# Patient Record
Sex: Male | Born: 1935 | Race: White | Hispanic: No | Marital: Married | Smoking: Former smoker
Health system: Southern US, Community
[De-identification: ages and names within clinical notes are randomized; demographics above are authoritative.]

## PROBLEM LIST (undated history)

## (undated) DIAGNOSIS — Z8673 Personal history of transient ischemic attack (TIA), and cerebral infarction without residual deficits: Secondary | ICD-10-CM

## (undated) DIAGNOSIS — I482 Chronic atrial fibrillation, unspecified: Secondary | ICD-10-CM

## (undated) DIAGNOSIS — I10 Essential (primary) hypertension: Secondary | ICD-10-CM

## (undated) DIAGNOSIS — H409 Unspecified glaucoma: Secondary | ICD-10-CM

## (undated) DIAGNOSIS — S2239XA Fracture of one rib, unspecified side, initial encounter for closed fracture: Secondary | ICD-10-CM

## (undated) HISTORY — PX: CATARACT EXTRACTION: SUR2

## (undated) HISTORY — DX: Personal history of transient ischemic attack (TIA), and cerebral infarction without residual deficits: Z86.73

## (undated) HISTORY — PX: OTHER SURGICAL HISTORY: SHX169

## (undated) HISTORY — DX: Essential (primary) hypertension: I10

## (undated) HISTORY — DX: Chronic atrial fibrillation, unspecified: I48.20

---

## 2006-09-08 ENCOUNTER — Ambulatory Visit (HOSPITAL_COMMUNITY): Admission: RE | Admit: 2006-09-08 | Discharge: 2006-09-08 | Payer: Self-pay | Admitting: Family Medicine

## 2006-12-02 ENCOUNTER — Ambulatory Visit (HOSPITAL_COMMUNITY): Admission: RE | Admit: 2006-12-02 | Discharge: 2006-12-02 | Payer: Self-pay | Admitting: General Surgery

## 2012-08-20 ENCOUNTER — Encounter: Payer: Self-pay | Admitting: Internal Medicine

## 2012-09-08 ENCOUNTER — Encounter: Payer: Self-pay | Admitting: Internal Medicine

## 2012-09-08 ENCOUNTER — Ambulatory Visit (INDEPENDENT_AMBULATORY_CARE_PROVIDER_SITE_OTHER): Payer: Medicare Other | Admitting: Internal Medicine

## 2012-09-08 VITALS — BP 130/70 | HR 60 | Ht 70.0 in | Wt 168.0 lb

## 2012-09-08 DIAGNOSIS — I10 Essential (primary) hypertension: Secondary | ICD-10-CM | POA: Insufficient documentation

## 2012-09-08 DIAGNOSIS — I4891 Unspecified atrial fibrillation: Secondary | ICD-10-CM

## 2012-09-08 NOTE — Progress Notes (Signed)
OFFICE NOTE  Chief Complaint:  Six-month followup  Primary Care Physician: No primary provider on file.  HPI:  Zachary Arellano is a 79 year old gentleman with history of chronic atrial fibrillation and hypertension which has been well controlled. Recently, he was put on Xarelto which he seems to be tolerating very well. I had also decreased his aspirin to 81 mg daily. With regards to blood pressure control, he is on several medications. Blood pressure has remained well controlled; it is 134/60 today. He has managed to lose a couple of pounds and remains active and denies any chest pain, worsening shortness of breath, palpitations, presyncope or syncopal symptoms.  PMHx:  Past Medical History  Diagnosis Date  . Chronic atrial fibrillation     2D echo  09/23/2006 LA and right atrium severely dilated, moderate pulmonary hypertension  . Hypertension     History reviewed. No pertinent past surgical history.  FAMHx:  Family History  Problem Relation Age of Onset  . Cancer Mother   . Heart disease Father   . Emphysema Father   . Heart disease Brother     SOCHx:   reports that he has quit smoking. He does not have any smokeless tobacco history on file. He reports that  drinks alcohol. He reports that he does not use illicit drugs.  ALLERGIES:  No Known Allergies  ROS: A comprehensive review of systems was negative except for: Constitutional: positive for small amount of weight loss Ears, nose, mouth, throat, and face: positive for nasal congestion  HOME MEDS: Current Outpatient Prescriptions  Medication Sig Dispense Refill  . aspirin EC 81 MG tablet Take 81 mg by mouth daily.      Marland Kitchen atenolol (TENORMIN) 100 MG tablet Take 100 mg by mouth daily.      . cloNIDine (CATAPRES) 0.2 MG tablet Take 0.2 mg by mouth 2 (two) times daily.      Marland Kitchen diltiazem (CARDIZEM CD) 240 MG 24 hr capsule Take 240 mg by mouth daily.      . dorzolamide-timolol (COSOPT) 22.3-6.8 MG/ML ophthalmic solution  Place 1 drop into both eyes 2 (two) times daily.      Marland Kitchen lisinopril-hydrochlorothiazide (PRINZIDE,ZESTORETIC) 20-25 MG per tablet Take 1 tablet by mouth daily.      . rivaroxaban (XARELTO) 10 MG TABS tablet Take by mouth daily. Takes 20 mg daily      . timolol (BETIMOL) 0.5 % ophthalmic solution Place 1 drop into both eyes 2 (two) times daily.       No current facility-administered medications for this visit.    LABS/IMAGING: No results found for this or any previous visit (from the past 48 hour(s)). No results found.  VITALS: BP 130/70  Pulse 60  Ht 5\' 10"  (1.778 m)  Wt 168 lb (76.204 kg)  BMI 24.11 kg/m2  EXAM: General appearance: alert and no distress Neck: no adenopathy, no carotid bruit, no JVD, supple, symmetrical, trachea midline and thyroid not enlarged, symmetric, no tenderness/mass/nodules Lungs: clear to auscultation bilaterally Heart: irregularly irregular rhythm Abdomen: soft, non-tender; bowel sounds normal; no masses,  no organomegaly Extremities: extremities normal, atraumatic, no cyanosis or edema Pulses: 2+ and symmetric Skin: Skin color, texture, turgor normal. No rashes or lesions Neurologic: Grossly normal  EKG: Atrial fibrillation at 60, nonspecific T wave changes  ASSESSMENT: 1. Atrial fibrillation with controlled ventricular response on Xarelto 2. Hypertension-well controlled  PLAN: 1.   [name] continues to do well with chronic atrial fibrillation. He is maintaining her relative and is  not having any issues with adverse bleeding. We gave him samples of the relative today. He is heart rate is well controlled. His blood pressure is also well controlled and he continues to be asymptomatic without any shortness of breath or chest pain despite activity. We will continue with his current medication regimen and plan to see him back in 6 months to a year.  Chrystie Nose, MD, [facility] Attending Cardiologist [facility]  HILTY,Kenneth C 09/08/2012, 9:43 AM

## 2012-09-08 NOTE — Patient Instructions (Signed)
Your physician recommends that you schedule a follow-up appointment in: 6 months  

## 2012-10-06 ENCOUNTER — Other Ambulatory Visit: Payer: Self-pay | Admitting: Internal Medicine

## 2012-10-06 NOTE — Telephone Encounter (Signed)
Rx was sent to pharmacy electronically. 

## 2012-11-19 ENCOUNTER — Other Ambulatory Visit: Payer: Self-pay | Admitting: Internal Medicine

## 2012-11-19 NOTE — Telephone Encounter (Signed)
Rx was sent to pharmacy electronically. 

## 2012-12-15 ENCOUNTER — Other Ambulatory Visit: Payer: Self-pay | Admitting: Internal Medicine

## 2012-12-15 NOTE — Telephone Encounter (Signed)
Rx was sent to pharmacy electronically. 

## 2012-12-23 ENCOUNTER — Other Ambulatory Visit: Payer: Self-pay | Admitting: Internal Medicine

## 2012-12-23 NOTE — Telephone Encounter (Signed)
Rx was sent to pharmacy electronically. 

## 2013-03-02 ENCOUNTER — Encounter: Payer: Self-pay | Admitting: Internal Medicine

## 2013-03-02 ENCOUNTER — Ambulatory Visit (INDEPENDENT_AMBULATORY_CARE_PROVIDER_SITE_OTHER): Payer: Medicare Other | Admitting: Internal Medicine

## 2013-03-02 VITALS — BP 122/80 | HR 65 | Ht 70.0 in | Wt 172.2 lb

## 2013-03-02 DIAGNOSIS — I4891 Unspecified atrial fibrillation: Secondary | ICD-10-CM

## 2013-03-02 DIAGNOSIS — I10 Essential (primary) hypertension: Secondary | ICD-10-CM

## 2013-03-02 NOTE — Patient Instructions (Signed)
Your physician wants you to follow-up in:  6 months. You will receive a reminder letter in the mail two months in advance. If you don't receive a letter, please call our office to schedule the follow-up appointment.   

## 2013-03-02 NOTE — Progress Notes (Signed)
OFFICE NOTE  Chief Complaint:  Routine follow-up  Primary Care Physician: Kirk Ruths, MD  HPI:  Zachary Arellano  is a 79 year old gentleman with history of chronic atrial fibrillation and hypertension which has been well controlled. Recently, he was put on Xarelto which he seems to be tolerating very well. I had also decreased his aspirin to 81 mg daily. With regards to blood pressure control, he is on several medications. Blood pressure has remained well controlled. He has managed to lose a couple of pounds and remains active and denies any chest pain, worsening shortness of breath, palpitations, presyncope or syncopal symptoms.   PMHx:  Past Medical History  Diagnosis Date  . Chronic atrial fibrillation     2D echo  09/23/2006 LA and right atrium severely dilated, moderate pulmonary hypertension  . Hypertension     History reviewed. No pertinent past surgical history.  FAMHx:  Family History  Problem Relation Age of Onset  . Cancer Mother   . Heart disease Father   . Emphysema Father   . Heart disease Brother     SOCHx:   reports that he has quit smoking. He does not have any smokeless tobacco history on file. He reports that he drinks alcohol. He reports that he does not use illicit drugs.  ALLERGIES:  No Known Allergies  ROS: A comprehensive review of systems was negative.  HOME MEDS: Current Outpatient Prescriptions  Medication Sig Dispense Refill  . aspirin EC 81 MG tablet Take 81 mg by mouth daily.      Marland Kitchen atenolol (TENORMIN) 100 MG tablet Take 100 mg by mouth daily.      . cloNIDine (CATAPRES) 0.2 MG tablet Take 1 tablet (0.2 mg total) by mouth 2 (two) times daily.  60 tablet  5  . diltiazem (CARDIZEM CD) 240 MG 24 hr capsule TAKE ONE CAPSULE BY MOUTH DAILY  30 capsule  8  . dorzolamide-timolol (COSOPT) 22.3-6.8 MG/ML ophthalmic solution Place 1 drop into both eyes 2 (two) times daily.      Marland Kitchen lisinopril-hydrochlorothiazide (PRINZIDE,ZESTORETIC) 20-25  MG per tablet TAKE ONE (1) TABLET EACH DAY  90 tablet  1  . rivaroxaban (XARELTO) 10 MG TABS tablet Take by mouth daily. Takes 20 mg daily       No current facility-administered medications for this visit.    LABS/IMAGING: No results found for this or any previous visit (from the past 48 hour(s)). No results found.  VITALS: BP 122/80  Pulse 65  Ht 5\' 10"  (1.778 m)  Wt 172 lb 3.2 oz (78.109 kg)  BMI 24.71 kg/m2  EXAM: General appearance: alert and no distress Neck: no adenopathy, no carotid bruit, no JVD, supple, symmetrical, trachea midline and thyroid not enlarged, symmetric, no tenderness/mass/nodules Lungs: clear to auscultation bilaterally Heart: irregularly irregular, S1, S2 normal, no murmur, click, rub or gallop Abdomen: soft, non-tender; bowel sounds normal; no masses,  no organomegaly Extremities: extremities normal, atraumatic, no cyanosis or edema Pulses: 2+ and symmetric Skin: Skin color, texture, turgor normal. No rashes or lesions Neurologic: Grossly normal psych: Pleasant, normal mood  EKG: Atrial fibrillation at 65  ASSESSMENT: 1. Chronic atrial fibrillation - on Xarelto without bleeding 2. Hypertension-well controlled  PLAN: 1.   Mr. Zachary Arellano is doing well on Xarelto for atrial fibrillation. He also takes low-dose aspirin. He previously been on full dose aspirin and I decreased that and reminded him that he does not necessarily need to take aspirin in addition to his Xarelto, however he  wishes to continue it. His blood pressure has been very well controlled on his current medicines and I would not recommend changes there. He recently had a repeat lipid profile which shows excellent control and it is not recommended that he is on cholesterol medications at this time. Plan to followup with me again in 6 months or sooner as necessary.  Chrystie Nose, MD, [facility] Attending Cardiologist [facility]  Jeidi Gilles C 03/02/2013, 4:48 PM

## 2013-03-03 ENCOUNTER — Encounter: Payer: Self-pay | Admitting: Internal Medicine

## 2013-04-12 ENCOUNTER — Other Ambulatory Visit: Payer: Self-pay | Admitting: Internal Medicine

## 2013-04-12 NOTE — Telephone Encounter (Signed)
Rx was sent to pharmacy electronically. 

## 2013-06-09 ENCOUNTER — Other Ambulatory Visit: Payer: Self-pay | Admitting: Internal Medicine

## 2013-06-09 MED ORDER — CLONIDINE HCL 0.2 MG PO TABS: ORAL_TABLET | ORAL | Status: DC

## 2013-06-09 NOTE — Addendum Note (Signed)
Addended by: Diana Eves on: 06/09/2013 11:42 AM   Modules accepted: Orders

## 2013-06-09 NOTE — Telephone Encounter (Signed)
Rx was sent to pharmacy electronically. 

## 2013-07-03 ENCOUNTER — Other Ambulatory Visit: Payer: Self-pay | Admitting: Internal Medicine

## 2013-07-03 NOTE — Telephone Encounter (Signed)
Rx was sent to pharmacy electronically. 

## 2013-09-09 ENCOUNTER — Other Ambulatory Visit: Payer: Self-pay | Admitting: Internal Medicine

## 2013-09-09 NOTE — Telephone Encounter (Signed)
Rx was sent to pharmacy electronically. Last OV 02/2013 (recall for 6 month visit - due 08/2013)

## 2013-09-25 ENCOUNTER — Other Ambulatory Visit: Payer: Self-pay | Admitting: Internal Medicine

## 2013-10-14 ENCOUNTER — Encounter: Payer: Self-pay | Admitting: Internal Medicine

## 2013-10-14 ENCOUNTER — Ambulatory Visit (INDEPENDENT_AMBULATORY_CARE_PROVIDER_SITE_OTHER): Payer: Medicare Other | Admitting: Internal Medicine

## 2013-10-14 VITALS — BP 152/84 | HR 59 | Ht 70.0 in | Wt 169.0 lb

## 2013-10-14 DIAGNOSIS — I482 Chronic atrial fibrillation, unspecified: Secondary | ICD-10-CM

## 2013-10-14 DIAGNOSIS — Z7901 Long term (current) use of anticoagulants: Secondary | ICD-10-CM

## 2013-10-14 DIAGNOSIS — I4891 Unspecified atrial fibrillation: Secondary | ICD-10-CM

## 2013-10-14 DIAGNOSIS — I10 Essential (primary) hypertension: Secondary | ICD-10-CM

## 2013-10-14 MED ORDER — RIVAROXABAN 20 MG PO TABS: ORAL_TABLET | ORAL | Status: DC

## 2013-10-14 NOTE — Progress Notes (Signed)
OFFICE NOTE  Chief Complaint:  Routine follow-up  Primary Care Physician: Leonides Grills, MD  HPI:  Zachary Arellano  is a 80 year old gentleman with history of chronic atrial fibrillation and hypertension which has been well controlled. Recently, he was put on Xarelto which he seems to be tolerating very well. I had also decreased his aspirin to 81 mg daily. With regards to blood pressure control, he is on several medications. Blood pressure has remained well controlled. He has managed to lose a couple of pounds and remains active and denies any chest pain, worsening shortness of breath, palpitations, presyncope or syncopal symptoms.   Mr. Tousley returns today for followup. He denies any new complaints. He has had no bleeding issues on Xarelto.  PMHx:  Past Medical History  Diagnosis Date  . Chronic atrial fibrillation     2D echo  09/23/2006 LA and right atrium severely dilated, moderate pulmonary hypertension  . Hypertension     History reviewed. No pertinent past surgical history.  FAMHx:  Family History  Problem Relation Age of Onset  . Cancer Mother   . Heart disease Father   . Emphysema Father   . Heart disease Brother     SOCHx:   reports that he has quit smoking. He does not have any smokeless tobacco history on file. He reports that he drinks alcohol. He reports that he does not use illicit drugs.  ALLERGIES:  No Known Allergies  ROS: A comprehensive review of systems was negative.  HOME MEDS: Current Outpatient Prescriptions  Medication Sig Dispense Refill  . aspirin EC 81 MG tablet Take 81 mg by mouth daily.      Marland Kitchen atenolol (TENORMIN) 100 MG tablet TAKE ONE (1) TABLET EACH DAY  90 tablet  2  . cloNIDine (CATAPRES) 0.2 MG tablet TAKE ONE TABLET TWICE DAILY  60 tablet  5  . diltiazem (DILT-XR) 240 MG 24 hr capsule Take 1 capsule (240 mg total) by mouth daily. <please make appointment for future refills>  30 capsule  1  . dorzolamide-timolol (COSOPT)  22.3-6.8 MG/ML ophthalmic solution Place 1 drop into both eyes 2 (two) times daily.      Marland Kitchen latanoprost (XALATAN) 0.005 % ophthalmic solution Place 1 drop into both eyes at bedtime.      Marland Kitchen lisinopril-hydrochlorothiazide (PRINZIDE,ZESTORETIC) 20-25 MG per tablet TAKE ONE (1) TABLET EACH DAY  90 tablet  3  . rivaroxaban (XARELTO) 20 MG TABS tablet TAKE ONE (1) TABLET EACH DAY  30 tablet  0  . [DISCONTINUED] diltiazem (CARDIZEM CD) 240 MG 24 hr capsule TAKE ONE CAPSULE BY MOUTH DAILY  30 capsule  8   No current facility-administered medications for this visit.    LABS/IMAGING: No results found for this or any previous visit (from the past 48 hour(s)). No results found.  VITALS: BP 152/84  Pulse 59  Ht 5\' 10"  (1.778 m)  Wt 169 lb (76.658 kg)  BMI 24.25 kg/m2  EXAM: General appearance: alert and no distress Neck: no adenopathy, no carotid bruit, no JVD, supple, symmetrical, trachea midline and thyroid not enlarged, symmetric, no tenderness/mass/nodules Lungs: clear to auscultation bilaterally Heart: irregularly irregular, S1, S2 normal, no murmur, click, rub or gallop Abdomen: soft, non-tender; bowel sounds normal; no masses,  no organomegaly Extremities: extremities normal, atraumatic, no cyanosis or edema Pulses: 2+ and symmetric Skin: Skin color, texture, turgor normal. No rashes or lesions Neurologic: Grossly normal psych: Pleasant, normal mood  EKG: Atrial fibrillation at 59  ASSESSMENT: 1.  Chronic atrial fibrillation - on Xarelto without bleeding 2. Hypertension-well controlled  PLAN: 1.   Mr. Mcquillen is doing well on Xarelto for atrial fibrillation. Blood pressure has also been well controlled. There no bleeding issues. Overall he feels well. He occasionally has some positional dizziness but this is not new for him. I recommend continuing his current medications we'll see him back in 6 months.  Pixie Casino, MD, [facility] Attending Cardiologist [facility]  Jabreel Chimento C 10/14/2013, 10:54 AM

## 2013-10-14 NOTE — Patient Instructions (Signed)
Your physician wants you to follow-up in:  6 months. You will receive a reminder letter in the mail two months in advance. If you don't receive a letter, please call our office to schedule the follow-up appointment.   

## 2013-11-05 ENCOUNTER — Other Ambulatory Visit: Payer: Self-pay | Admitting: Internal Medicine

## 2013-11-05 NOTE — Telephone Encounter (Signed)
Rx refill sent to patient pharmacy   

## 2013-12-06 ENCOUNTER — Other Ambulatory Visit: Payer: Self-pay | Admitting: Internal Medicine

## 2013-12-06 NOTE — Telephone Encounter (Signed)
Rx refill sent to patient pharmacy   

## 2014-01-06 ENCOUNTER — Telehealth: Payer: Self-pay | Admitting: Internal Medicine

## 2014-01-06 NOTE — Telephone Encounter (Signed)
Spoke with pt, aware samples placed at the front desk for pick up 

## 2014-01-06 NOTE — Telephone Encounter (Signed)
Pt need some samples of Xarelto 20 ng please.

## 2014-03-16 ENCOUNTER — Other Ambulatory Visit: Payer: Self-pay | Admitting: Internal Medicine

## 2014-03-16 NOTE — Telephone Encounter (Signed)
Rx has been sent to the pharmacy electronically. ° °

## 2014-03-22 ENCOUNTER — Encounter: Payer: Self-pay | Admitting: Internal Medicine

## 2014-03-22 ENCOUNTER — Ambulatory Visit (INDEPENDENT_AMBULATORY_CARE_PROVIDER_SITE_OTHER): Payer: Medicare Other | Admitting: Internal Medicine

## 2014-03-22 VITALS — BP 134/72 | HR 61 | Ht 70.0 in | Wt 167.2 lb

## 2014-03-22 DIAGNOSIS — I482 Chronic atrial fibrillation, unspecified: Secondary | ICD-10-CM

## 2014-03-22 DIAGNOSIS — Z7901 Long term (current) use of anticoagulants: Secondary | ICD-10-CM

## 2014-03-22 DIAGNOSIS — I10 Essential (primary) hypertension: Secondary | ICD-10-CM

## 2014-03-22 MED ORDER — RIVAROXABAN 20 MG PO TABS: ORAL_TABLET | ORAL | Status: DC

## 2014-03-22 NOTE — Patient Instructions (Signed)
Your physician wants you to follow-up in: 6 months with Dr. Hilty. You will receive a reminder letter in the mail two months in advance. If you don't receive a letter, please call our office to schedule the follow-up appointment.    

## 2014-03-22 NOTE — Progress Notes (Signed)
OFFICE NOTE  Chief Complaint:  Routine follow-up  Primary Care Physician: Purvis Kilts, MD  HPI:  Zachary Arellano  is a 80 year old gentleman with history of chronic atrial fibrillation and hypertension which has been well controlled. Recently, he was put on Xarelto which he seems to be tolerating very well. I had also decreased his aspirin to 81 mg daily. With regards to blood pressure control, he is on several medications. Blood pressure has remained well controlled. He has managed to lose a couple of pounds and remains active and denies any chest pain, worsening shortness of breath, palpitations, presyncope or syncopal symptoms.   Rayvion is doing quite well. He continues to be active. He replaced golf just a few days ago without any problems. Denies any chest pain or shortness of breath. He's had no bleeding issues on Zarrella 2 although was concerned because his cousin had a significant intra-abdominal hemorrhage on result oh requiring transfusion. He's done well and we should continue this medication. He remains in A. fib and is permanently which puts him at higher risk of stroke. Blood pressure is well controlled.  PMHx:  Past Medical History  Diagnosis Date  . Chronic atrial fibrillation     2D echo  09/23/2006 LA and right atrium severely dilated, moderate pulmonary hypertension  . Hypertension     History reviewed. No pertinent past surgical history.  FAMHx:  Family History  Problem Relation Age of Onset  . Cancer Mother   . Heart disease Father   . Emphysema Father   . Heart disease Brother     SOCHx:   reports that he has quit smoking. He does not have any smokeless tobacco history on file. He reports that he drinks alcohol. He reports that he does not use illicit drugs.  ALLERGIES:  No Known Allergies  ROS: A comprehensive review of systems was negative.  HOME MEDS: Current Outpatient Prescriptions  Medication Sig Dispense Refill  . aspirin EC 81 MG  tablet Take 81 mg by mouth daily.    Marland Kitchen atenolol (TENORMIN) 100 MG tablet TAKE ONE (1) TABLET EACH DAY 90 tablet 1  . cloNIDine (CATAPRES) 0.2 MG tablet TAKE ONE TABLET TWICE DAILY 60 tablet 5  . DILT-XR 240 MG 24 hr capsule TAKE ONE CAPSULE BY MOUTH DAILY 30 capsule 5  . dorzolamide-timolol (COSOPT) 22.3-6.8 MG/ML ophthalmic solution Place 1 drop into both eyes 2 (two) times daily.    Marland Kitchen latanoprost (XALATAN) 0.005 % ophthalmic solution Place 1 drop into both eyes at bedtime.    Marland Kitchen lisinopril-hydrochlorothiazide (PRINZIDE,ZESTORETIC) 20-25 MG per tablet TAKE ONE (1) TABLET EACH DAY 90 tablet 3  . rivaroxaban (XARELTO) 20 MG TABS tablet TAKE ONE (1) TABLET EACH DAY 25 tablet 0  . [DISCONTINUED] diltiazem (CARDIZEM CD) 240 MG 24 hr capsule TAKE ONE CAPSULE BY MOUTH DAILY 30 capsule 8   No current facility-administered medications for this visit.    LABS/IMAGING: No results found for this or any previous visit (from the past 48 hour(s)). No results found.  VITALS: BP 134/72 mmHg  Pulse 61  Ht 5\' 10"  (1.778 m)  Wt 167 lb 3.2 oz (75.841 kg)  BMI 23.99 kg/m2  EXAM: General appearance: alert and no distress Neck: no adenopathy, no carotid bruit, no JVD, supple, symmetrical, trachea midline and thyroid not enlarged, symmetric, no tenderness/mass/nodules Lungs: clear to auscultation bilaterally Heart: irregularly irregular, S1, S2 normal, no murmur, click, rub or gallop Abdomen: soft, non-tender; bowel sounds normal; no masses,  no organomegaly Extremities: extremities normal, atraumatic, no cyanosis or edema Pulses: 2+ and symmetric Skin: Skin color, texture, turgor normal. No rashes or lesions Neurologic: Grossly normal psych: Pleasant, normal mood  EKG: Atrial fibrillation at 61  ASSESSMENT: 1. Chronic atrial fibrillation - on Xarelto without bleeding 2. Hypertension-well controlled  PLAN: 1.   Mr. Helbing is doing well on Xarelto for atrial fibrillation. Blood pressure has also  been well controlled. There no bleeding issues. Overall he feels well. He occasionally has some positional dizziness but this is not new for him. I recommend continuing his current medications we'll see him back in 6 months.  Pixie Casino, MD, [facility] Attending Cardiologist [facility]  Alesha Jaffee C 03/22/2014, 6:01 PM

## 2014-04-30 ENCOUNTER — Emergency Department (HOSPITAL_COMMUNITY)
Admission: EM | Admit: 2014-04-30 | Discharge: 2014-04-30 | Disposition: A | Discharge status: Home / Self Care | Payer: Medicare Other | Source: Home / Self Care | Attending: Emergency Medicine | Admitting: Emergency Medicine

## 2014-04-30 ENCOUNTER — Telehealth: Payer: Self-pay | Admitting: Physician Assistant

## 2014-04-30 ENCOUNTER — Encounter (HOSPITAL_COMMUNITY): Payer: Self-pay | Admitting: *Deleted

## 2014-04-30 ENCOUNTER — Emergency Department (HOSPITAL_COMMUNITY)
Admission: EM | Admit: 2014-04-30 | Discharge: 2014-04-30 | Disposition: A | Discharge status: Home / Self Care | Payer: Medicare Other | Attending: Emergency Medicine | Admitting: Emergency Medicine

## 2014-04-30 ENCOUNTER — Encounter (HOSPITAL_COMMUNITY): Payer: Self-pay | Admitting: Emergency Medicine

## 2014-04-30 DIAGNOSIS — L7621 Postprocedural hemorrhage and hematoma of skin and subcutaneous tissue following a dermatologic procedure: Secondary | ICD-10-CM | POA: Insufficient documentation

## 2014-04-30 DIAGNOSIS — L7622 Postprocedural hemorrhage and hematoma of skin and subcutaneous tissue following other procedure: Secondary | ICD-10-CM

## 2014-04-30 DIAGNOSIS — T148XXA Other injury of unspecified body region, initial encounter: Secondary | ICD-10-CM

## 2014-04-30 DIAGNOSIS — Z87891 Personal history of nicotine dependence: Secondary | ICD-10-CM

## 2014-04-30 DIAGNOSIS — Z7982 Long term (current) use of aspirin: Secondary | ICD-10-CM

## 2014-04-30 DIAGNOSIS — I10 Essential (primary) hypertension: Secondary | ICD-10-CM | POA: Insufficient documentation

## 2014-04-30 DIAGNOSIS — Z79899 Other long term (current) drug therapy: Secondary | ICD-10-CM

## 2014-04-30 DIAGNOSIS — Z7901 Long term (current) use of anticoagulants: Secondary | ICD-10-CM | POA: Diagnosis not present

## 2014-04-30 DIAGNOSIS — D689 Coagulation defect, unspecified: Secondary | ICD-10-CM | POA: Diagnosis present

## 2014-04-30 LAB — I-STAT CHEM 8, ED
BUN: 20 mg/dL (ref 6–23)
CALCIUM ION: 1.16 mmol/L (ref 1.13–1.30)
CHLORIDE: 93 meq/L — AB (ref 96–112)
CREATININE: 0.8 mg/dL (ref 0.50–1.35)
Glucose, Bld: 145 mg/dL — ABNORMAL HIGH (ref 70–99)
HEMATOCRIT: 45 % (ref 39.0–52.0)
Hemoglobin: 15.3 g/dL (ref 13.0–17.0)
POTASSIUM: 4.1 mmol/L (ref 3.5–5.1)
Sodium: 131 mmol/L — ABNORMAL LOW (ref 135–145)
TCO2: 23 mmol/L (ref 0–100)

## 2014-04-30 LAB — CBC WITH DIFFERENTIAL/PLATELET
BASOS PCT: 0 % (ref 0–1)
Basophils Absolute: 0 10*3/uL (ref 0.0–0.1)
EOS ABS: 0.2 10*3/uL (ref 0.0–0.7)
Eosinophils Relative: 2 % (ref 0–5)
HCT: 37.6 % — ABNORMAL LOW (ref 39.0–52.0)
HEMOGLOBIN: 13.1 g/dL (ref 13.0–17.0)
LYMPHS ABS: 1.3 10*3/uL (ref 0.7–4.0)
Lymphocytes Relative: 14 % (ref 12–46)
MCH: 33.7 pg (ref 26.0–34.0)
MCHC: 34.8 g/dL (ref 30.0–36.0)
MCV: 96.7 fL (ref 78.0–100.0)
MONOS PCT: 13 % — AB (ref 3–12)
Monocytes Absolute: 1.2 10*3/uL — ABNORMAL HIGH (ref 0.1–1.0)
Neutro Abs: 6.5 10*3/uL (ref 1.7–7.7)
Neutrophils Relative %: 71 % (ref 43–77)
Platelets: 213 10*3/uL (ref 150–400)
RBC: 3.89 MIL/uL — AB (ref 4.22–5.81)
RDW: 13.4 % (ref 11.5–15.5)
WBC: 9.2 10*3/uL (ref 4.0–10.5)

## 2014-04-30 LAB — PROTIME-INR
INR: 1.69 — ABNORMAL HIGH (ref 0.00–1.49)
Prothrombin Time: 20 seconds — ABNORMAL HIGH (ref 11.6–15.2)

## 2014-04-30 MED ORDER — SILVER NITRATE-POT NITRATE 75-25 % EX MISC
1.0000 "application " | Freq: Once | CUTANEOUS | Status: AC
  Administered 2014-04-30: 1 via TOPICAL

## 2014-04-30 MED ORDER — SILVER NITRATE-POT NITRATE 75-25 % EX MISC
CUTANEOUS | Status: AC
  Administered 2014-04-30: 1 via TOPICAL
  Filled 2014-04-30: qty 1

## 2014-04-30 MED ORDER — LIDOCAINE-EPINEPHRINE (PF) 1 %-1:200000 IJ SOLN
INTRAMUSCULAR | Status: AC
  Filled 2014-04-30: qty 10

## 2014-04-30 MED ORDER — "THROMBI-PAD 3""X3"" EX PADS"
MEDICATED_PAD | CUTANEOUS | Status: AC
  Administered 2014-04-30: 06:00:00
  Filled 2014-04-30: qty 1

## 2014-04-30 NOTE — ED Notes (Signed)
MD at bedside. 

## 2014-04-30 NOTE — ED Notes (Signed)
Return visit for facial bleeding

## 2014-04-30 NOTE — Telephone Encounter (Signed)
Zachary Arellano is a 81 y.o. male on Xarelto for PAFib. He had recent skin cancer removed from his face. This started to bleed last night.  He went to the ED x 2 to get the bleeding stopped. The ED physician told him to hold his Xarelto for 2 days. He has not had a CVA in the past. He called to see if he could hold his Xarelto. I advised him to hold the Xarelto for 2 days. He can hold it a 3rd day if he is still bleeding.  But, he should call our office back on Monday 1/18 to apprise [place] of his condition. He agreed with this plan. Richardson Dopp, PA-C   04/30/2014 5:08 PM

## 2014-04-30 NOTE — ED Notes (Signed)
Quick clot applied to the spot and pressure bandage applied. Will continue to monitor.

## 2014-04-30 NOTE — ED Notes (Signed)
Pt c/o facial bleeding since midnight. Pt states he had skin surgery last week in the area that is bleeding.

## 2014-04-30 NOTE — ED Notes (Signed)
Bleeding controlled. No signs of bleeding through the thrombi-pad dressing. EDP made aware.

## 2014-04-30 NOTE — ED Provider Notes (Signed)
CSN: [id number]     Arrival date & time 04/30/14  0441 History   First MD Initiated Contact with Patient 04/30/14 0507     Chief Complaint  Patient presents with  . Coagulation Disorder      HPI  Pt was seen at 0455. Per pt and his wife, c/o unknown onset and persistence of constant right facial wound bleeding that was noticed several hours PTA. Pt states he woke up from sleep at midnight "and felt something wet on my face." Pt states that is when he noticed his right facial wound bleeding. Pt states approximately 1 week ago his Dermatologist "scooped out a skin lesion" on the right side of his face. Pt states he was told "it was a basal cell." Pt states the area had "formed a scab" and was "healing up nice" before his symptoms began. Denies direct injury. Denies lightheadedness, no CP/SOB, no facial pain, no fevers.     Past Medical History  Diagnosis Date  . Chronic atrial fibrillation     2D echo  09/23/2006 LA and right atrium severely dilated, moderate pulmonary hypertension  . Hypertension    History reviewed. No pertinent past surgical history.   Family History  Problem Relation Age of Onset  . Cancer Mother   . Heart disease Father   . Emphysema Father   . Heart disease Brother    History  Substance Use Topics  . Smoking status: Former Research scientist (life sciences)  . Smokeless tobacco: Not on file     Comment: quit smoking in his 64's  . Alcohol Use: Yes    Review of Systems ROS: Statement: All systems negative except as marked or noted in the HPI; Constitutional: Negative for fever and chills. ; ; Eyes: Negative for eye pain, redness and discharge. ; ; ENMT: Negative for ear pain, hoarseness, nasal congestion, sinus pressure and sore throat. ; ; Cardiovascular: Negative for chest pain, palpitations, diaphoresis, dyspnea and peripheral edema. ; ; Respiratory: Negative for cough, wheezing and stridor. ; ; Gastrointestinal: Negative for nausea, vomiting, diarrhea, abdominal pain, blood in  stool, hematemesis, jaundice and rectal bleeding. . ; ; Genitourinary: Negative for dysuria, flank pain and hematuria. ; ; Musculoskeletal: Negative for back pain and neck pain. Negative for swelling and trauma.; ; Skin: +right facial wound bleeding. Negative for pruritus, rash, abrasions, blisters, bruising.; ; Neuro: Negative for headache, lightheadedness and neck stiffness. Negative for weakness, altered level of consciousness , altered mental status, extremity weakness, paresthesias, involuntary movement, seizure and syncope.     Allergies  Review of patient's allergies indicates no known allergies.  Home Medications   Prior to Admission medications   Medication Sig Start Date End Date Taking? Authorizing Provider  aspirin EC 81 MG tablet Take 81 mg by mouth daily.    Historical Provider, MD  atenolol (TENORMIN) 100 MG tablet TAKE ONE (1) TABLET EACH DAY 03/16/14   Pixie Casino, MD  cloNIDine (CATAPRES) 0.2 MG tablet TAKE ONE TABLET TWICE DAILY 06/09/13   Pixie Casino, MD  DILT-XR 240 MG 24 hr capsule TAKE ONE CAPSULE BY MOUTH DAILY 11/05/13   Pixie Casino, MD  dorzolamide-timolol (COSOPT) 22.3-6.8 MG/ML ophthalmic solution Place 1 drop into both eyes 2 (two) times daily. 06/19/12   Historical Provider, MD  latanoprost (XALATAN) 0.005 % ophthalmic solution Place 1 drop into both eyes at bedtime.    Historical Provider, MD  lisinopril-hydrochlorothiazide (PRINZIDE,ZESTORETIC) 20-25 MG per tablet TAKE ONE (1) TABLET EACH DAY 04/12/13   Nadean Corwin.  Hilty, MD  rivaroxaban (XARELTO) 20 MG TABS tablet TAKE ONE (1) TABLET EACH DAY 03/22/14   Pixie Casino, MD   BP 164/95 mmHg  Pulse 85  Temp(Src) 97.8 F (36.6 C)  Resp 18  Ht 5\' 10"  (1.778 m)  Wt 170 lb (77.111 kg)  BMI 24.39 kg/m2  SpO2 99% Physical Exam  0500: Physical examination:  Nursing notes reviewed; Vital signs and O2 SAT reviewed;  Constitutional: Well developed, Well nourished, Well hydrated, In no acute distress; Head:   Normocephalic, atraumatic. +right facial ulceration with one small area of bleeding.; Eyes: EOMI, PERRL, No scleral icterus; ENMT: Mouth and pharynx normal, Mucous membranes moist; Neck: Supple, Full range of motion, No lymphadenopathy; Cardiovascular: Irregular irregular rate and rhythm, No gallop; Respiratory: Breath sounds clear & equal bilaterally, No wheezes.  Speaking full sentences with ease, Normal respiratory effort/excursion; Chest: Nontender, Movement normal; Abdomen: Soft, Nontender, Nondistended, Normal bowel sounds; Genitourinary: No CVA tenderness; Extremities: Pulses normal, No tenderness, No edema, No calf edema or asymmetry.; Neuro: AA&Ox3, Major CN grossly intact.  Speech clear. No gross focal motor or sensory deficits in extremities. Climbs on and off stretcher easily by himself. Gait steady.; Skin: Color normal, Warm, Dry.   ED Course  Procedures     EKG Interpretation None      MDM  MDM Reviewed: previous chart, nursing note and vitals Reviewed previous: labs Interpretation: labs     Results for orders placed or performed during the hospital encounter of 04/30/14  I-stat Chem 8, ED  Result Value Ref Range   Sodium 131 (L) 135 - 145 mmol/L   Potassium 4.1 3.5 - 5.1 mmol/L   Chloride 93 (L) 96 - 112 mEq/L   BUN 20 6 - 23 mg/dL   Creatinine, Ser 0.80 0.50 - 1.35 mg/dL   Glucose, Bld 145 (H) 70 - 99 mg/dL   Calcium, Ion 1.16 1.13 - 1.30 mmol/L   TCO2 23 0 - 100 mmol/L   Hemoglobin 15.3 13.0 - 17.0 g/dL   HCT 45.0 39.0 - 52.0 %     0640:  Direct pressure applied without sustained improvement of bleeding. Also used silver nitrate and quick clot/pressure dressing without sustained improvement. Thrombi pad/pressure dressing applied approximately 1 hour ago. No further bleeding. Pt wants to go home now. VSS, H/H stable. Dx and testing d/w pt and family.  Questions answered.  Verb understanding, agreeable to d/c home with outpt f/u.      Francine Graven,  DO 04/30/14 2334

## 2014-04-30 NOTE — ED Notes (Signed)
Pt's face continues to bleed. Switched quick-clot dressing for a thrombi-pad dressing.

## 2014-04-30 NOTE — Discharge Instructions (Signed)
Deep Skin Avulsion Hold your xarelto for the next 2 days. Call your doctor on Monday. If bleeding recurs, hold pressure for 30 minutes. If bleeding persists after that return to the ED. A deep skin avulsion is when all layers of the skin or parts of body structures have been torn away. This is usually a result of severe injury (trauma). A deep skin avulsion can include damage to important structures beneath the skin such as tendons, ligaments, nerves, or blood vessels.  CAUSES  Many injuries can lead to a deep skin avulsion. These include:   Crush injuries.  Bites.  Falls against jagged surfaces.  Gunshot wounds.  Severe burns and injuries involving dragging (such as those from a bicycle or motorcycle accident). TREATMENT   If the wound is small and there is no damage to vital structures like nerves and blood vessels, the damaged tissues may be removed. Then, the wound can be cleaned thoroughly and closed.  A skin graft may be performed. This is a procedure in which the outer layer of skin is removed from a different part of your body. That skin (skin graft) is used to cover the open wound. This can happen after damaged tissue is removed and repairs are completed.  Your caregiver may onlyapply a bandage (dressing) to the wound. The wound will be kept clean and allowed to heal. Healing can take weeks or months and usually leaves a large scar. This type of treatment is only done if your caregiver feels that skin grafting or a similar procedure would not work. You might need a tetanus shot if:  You cannot remember when you had your last tetanus shot.  You have never had a tetanus shot.  The injury broke your skin. If you got a tetanus shot, your arm may swell, get red, and feel warm to the touch. This is common and not a problem. If you need a tetanus shot and you choose not to have one, there is a rare chance of getting tetanus. Sickness from tetanus can be serious. HOME CARE  INSTRUCTIONS   Only take over-the-counter or prescription medicines for pain, discomfort, or fever as directed by your caregiver.  Gently wash the area with mild soap and water 2 times a day, or as directed. Rinse off the soap. Pat the area dry with a clean towel. Do not rub the wound. This may cause bleeding.  Follow your caregiver's instructions for how often you need to change the dressing.  Apply ointment and a dressing to the wound as directed.  If the dressing sticks, moisten it with soapy water and gently remove it.  Change the bandage right away if it becomes wet, dirty, or starts to smell bad.  Take showers. Do not take tub baths, swim, or do anything that may soak the wound until it is healed.  Use anti-itch medicine as directed by your caregiver. The wound may itch when it is healing. Do not pick or scratch at the wound.  Follow up with your caregiver for stitches (sutures), staple, or skin adhesive strip removal. SEEK MEDICAL CARE IF:   You have redness, swelling, or increasing pain in your wound.  A red streak or line extends away from the wound.  You have pus coming from the wound.  You notice a bad smell coming from thewound or dressing.  The wound breaks open (edges not staying together) after sutures have been removed.  You notice something coming out of the wound, such as a small  piece of wood, glass, or metal.  You are unable to properly move a finger or toe if the wound is on your hand or foot.  You have severe swelling around the wound that causes pain and numbness.  Your arm, hand, leg, or foot changes color. SEEK IMMEDIATE MEDICAL CARE IF:   Your pain becomes severe or is not adequately relieved with pain medicine.  You have a fever.  You have nausea and vomiting for more than 24 hours.  You feel lightheaded, weak, or faint.  You develop chest pain or difficulty breathing. MAKE SURE YOU:   Understand these instructions.  Will watch your  condition.  Will get help right away if you are not doing well or get worse. Document Released: 05/28/2006 Document Revised: 06/24/2011 Document Reviewed: 08/05/2010 [facility] Patient Information 2015 [place], [place]. This information is not intended to replace advice given to you by your health care provider. Make sure you discuss any questions you have with your health care provider.

## 2014-04-30 NOTE — Discharge Instructions (Signed)
°Emergency Department Resource Guide °1) Find a Doctor and Pay Out of Pocket °Although you won't have to find out who is covered by your insurance plan, it is a good idea to ask around and get recommendations. You will then need to call the office and see if the doctor you have chosen will accept you as a new patient and what types of options they offer for patients who are self-pay. Some doctors offer discounts or will set up payment plans for their patients who do not have insurance, but you will need to ask so you aren't surprised when you get to your appointment. ° °2) Contact Your Local Health Department °Not all health departments have doctors that can see patients for sick visits, but many do, so it is worth a call to see if yours does. If you don't know where your local health department is, you can check in your phone book. The CDC also has a tool to help you locate your state's health department, and many state websites also have listings of all of their local health departments. ° °3) Find a Walk-in Clinic °If your illness is not likely to be very severe or complicated, you may want to try a walk in clinic. These are popping up all over the country in pharmacies, drugstores, and shopping centers. They're usually staffed by nurse practitioners or physician assistants that have been trained to treat common illnesses and complaints. They're usually fairly quick and inexpensive. However, if you have serious medical issues or chronic medical problems, these are probably not your best option. ° °No Primary Care Doctor: °- Call [facility] Connect at  832-8000 - they can help you locate a primary care doctor that  accepts your insurance, provides certain services, etc. °- Physician Referral Service- 1-800-533-3463 ° °Chronic Pain Problems: °Organization         Address  Phone   Notes  °[facility]  ([phone]) [phone] Patients need to be referred by their primary care doctor.  ° °Medication  Assistance: °Organization         Address  Phone   Notes  °[place] [facility] [address] °[place], [place] [address] (336) 641-8030 --Must be a resident of [place] °-- Must have NO insurance coverage whatsoever (no Medicaid/ Medicare, etc.) °-- The pt. MUST have a primary care doctor that directs their care regularly and follows them in the community °  °[facility]  (866) 331-1348   °[employer]  (888) 892-1162   ° °Agencies that provide inexpensive medical care: °Organization         Address  Phone   Notes  °[facility]  (336) 832-8035   °[facility]    (336) 832-7272   °[facility] [address] °[place], [place] [address] (336) 832-4777   °[facility] [address], °[place] (336) 271-4999   °[facility]    (336) 373-0678   °[facility]    (336) 272-1050   °[facility] ° [address], [place] Phone:  (336) 832-4444, Fax:  (336) 832-4440 Hours of Operation:  9 am - 6 pm, M-F.  Also accepts Medicaid/Medicare and self-pay.  °[facility] ° [address], [place] Phone: (336) 832-3150, Fax: (336) [phone]. Hours of Operation:  8:30 am - 5:30 pm, M-F.  Also accepts Medicaid and self-pay.  °[facility] [place] [address], [place] Phone: (336) 878-6027   °[facility] [address], [place], [place] (336)723-1848, Ext. 123 Mondays & Thursdays: 7-9 AM.  First 15 patients are seen on a first come, first serve basis. °  ° °Medicaid-accepting [place] Providers: ° °Organization         Address  Phone   Notes  °[facility] [address], [place] (336) 641-2100 Also accepts self-pay patients.  °[facility] [address], [place] ° (336) 856-9996   °[facility] [address], [place]  (336) 288-8857   °[facility] [address] [place] [address], [place] (336) 299-7000   °Veita Bland [address], [place]  ° (336) 373-1557 Only accepts [place] Access Medicaid patients after they have their name applied to their card.  ° °Self-Pay (no insurance) in [place]: ° °Organization         Address  Phone   Notes  °Sickle Cell Patients, [facility] [address], [place] (336) 832-1970   °[facility] [address], [place] (336) 832-4400   °[facility] ° [address], [place] (336) 992-4800   °[facility]/Dr. Osei-Bonsu ° [address] [place] [address], [place] or [address], [place] (336) [phone] Phone number for both [place] and [place] locations is the same.  °[facility] [address], [place] (336) 299-0000   °[facility] [address] [place] [address], [place] or [address] (336) 852-7530 °(336) 878-2260   °[facility] [address], [place] (336) 350-1642, phone; (336) 294-5005, fax Sees patients 1st and 3rd Saturday of every month.  Must not qualify for public or private insurance (i.e. Medicaid, Medicare, [place] Health Choice, Veterans' Benefits) • Household income should be no more than 200% of the poverty level •The clinic cannot treat you if you are pregnant or think you are pregnant • Sexually transmitted diseases are not treated at the clinic.  ° ° °Dental Care: °Organization         Address  Phone  Notes  °[place] [facility] [address], [place] (336) 641-6152 Accepts children up to age 21 who are enrolled in Medicaid or [place] Health Choice; pregnant women with a Medicaid card; and children who have applied for Medicaid or [place] Health Choice, but were declined, whose parents can pay a reduced fee at time of service.  °[place]  [facility] [place]  [address], [place] (336) 641-7733 Accepts children up to age 21 who are enrolled in Medicaid or [place] Health Choice; pregnant women with a Medicaid card; and children who have applied for Medicaid or [place] Health Choice, but were declined, whose parents can pay a reduced fee at time of service.  °[facility] ° [address], [place] (336) 641-4533 Patients are seen by appointment only. Walk-ins are not accepted. [facility] will see patients 18 years of age and older. °Monday - Tuesday (8am-5pm) °Most Wednesdays (8:30-5pm) °$30 per visit, cash only  °[facility] ° [address], [place] (336) 641-4533 Patients are seen by appointment only. Walk-ins are not accepted. [facility] will see patients 18 years of age and older. °One   Wednesday Evening (Monthly: Volunteer Based).  $30 per visit, cash only  °[facility]  (919) 537-3737 for adults; Children under age 4, call Graduate Pediatric Dentistry at ([phone]) [phone]. Children aged 4-14, please call (919) [phone] to request a pediatric application. ° Dental services are provided in all areas of dental care including fillings, crowns and bridges, complete and partial dentures, implants, gum treatment, root canals, and extractions. Preventive care is also provided. Treatment is provided to both adults and children. °Patients are selected via a lottery and there is often a waiting list. °  °[facility] [address], °[place] ° (336) 763-8833 [email or web address] °  °[facility] [address], [place], [place] (336)723-1848, Ext. 123 Second and Fourth Thursday of each month, opens at 6:30 AM; Clinic ends at 9 AM.  Patients are seen on a first-come first-served basis, and a limited number are seen during each clinic.  ° °[facility] ° [address], [place], [place] (336) 723-7904    Eligibility Requirements °You must have lived in [place], [place], or [place] counties for at least the last three months. °  You cannot be eligible for state or federal sponsored healthcare insurance, including Veterans Administration, Medicaid, or Medicare. °  You generally cannot be eligible for healthcare insurance through your employer.  °  How to apply: °Eligibility screenings are held every Tuesday and Wednesday afternoon from 1:00 pm until 4:00 pm. You do not need an appointment for the interview!  °[facility] [address], [place], [place] 336-631-2330   °[facility]  336-342-8273   °[place]  336-703-3100   ° [facility]  336-570-6415   ° °Behavioral Health Resources in the Community: °Intensive Outpatient Programs °Organization         Address  Phone  Notes  °[facility] [address], [place], [place] 336-878-6098   °[facility] [address], [place], [place] 336-832-9800   °[facility] [address], [place], [place] ° 336-882-2125   °[facility] [address],  °[place], [place] 1-800-853-5163 or 336-641-4981   °Substance Abuse Resources °Organization         Address  Phone  Notes  °[facility]  336-882-2125   °[facility]  336-784-9470   °[facility]  336-285-9073   °[facility]  336-845-3988   °Residential & Outpatient Substance Abuse Program  1-800-659-3381   °Psychological Services °Organization         Address  Phone  Notes  ° Health  336- 832-9600   °[facility]  336- 378-7881   °[facility] [address], [place] 1-800-853-5163 or 336-641-4981   ° °Mobile Crisis Teams °Organization         Address  Phone  Notes  °[facility], Mobile Crisis Care Unit  1-877-626-1772   °Assertive °[facility] ° [address]  [place], [place] 336-834-9664   °Sharon DeEsch [address] °[place] 336-554-5454   ° °Self-Help/Support Groups °Organization         Address  Phone             Notes  °[facility]  - variety of support groups  336- 373-1402 Call for more information  °Narcotics Anonymous (NA), [facility] [address], °[place]   2 meetings at this location  ° °  Residential Treatment Programs Organization         Address  Phone  Notes  [facility] [address]    [place]  1-747-858-4748   [facility]  [address] [place] [address], [place], [place]   [place], [place] 702-619-7270 Admissions: 8am-3pm M-F  [facility] [place] [address],    [place], [place] [phone]   [facility] [address] [place], [place], [place]   [facility] [address]  [place], [place]   [facility] Intensive Outpatient [address], [place], [place]   [facility] [address]  [place], [place] 1-713-342-3016 or 781-237-0861   [facility] [address] [place], [place] Accepts Medicaid  [facility] [address],  [place] 1-765-338-2462 Substance Abuse/Addiction Treatment   [facility]         Address  Phone  Notes  [facility]  (760)526-4627   Domenic Schwab, PhD [address] [place], [place]   352-197-4882 or 931-306-2316   [place], [place] 4316692115   [facility] [address] [place], [place] [phone] Insurance/Medicaid/sponsorship through [facility] and Families [address]                                    [place], [place] [phone] [facility] [address], [place] [phone]    Dr. Adele Schilder  [phone]   [facility] Dept. 1) [address] [place]) [place] 3)  [address], [place] [phone]  [phone]   [place] 262-880-6623 or [phone] (After Hours)      Keep the current dressing on for the next 24 hours. After carefully removing the dressing, cover with a new clean/dry dressing.  Change the dressing whenever it becomes wet or soiled.  Call your regular medical doctor on Monday to schedule a follow up appointment for a recheck within the next 48 hours.  Return to the Emergency Department immediately if worsening.

## 2014-04-30 NOTE — ED Notes (Signed)
Lab at bedside

## 2014-04-30 NOTE — ED Notes (Signed)
Pressure being applied to the spot with no success in decreasing the bleeding. EDP made aware.

## 2014-04-30 NOTE — ED Provider Notes (Signed)
CSN: [id number]     Arrival date & time 04/30/14  1257 History  This chart was scribed for Zachary Essex, MD by Evelene Croon, ED Scribe. This patient was seen in room [facility]6/[facility] and the patient's care was started 1:11 PM.    Chief Complaint  Patient presents with  . Coagulation Disorder     The history is provided by the patient and the spouse. No language interpreter was used.    HPI Comments:  Zachary Arellano is a 81 y.o. male with a h/o squamous cell CA  who presents to the Emergency Department 2 weeks s/p cancer removal from his right face complaining of intermittent bleeding from the site that started ~ 0030 this am. Pt was seen in the ED after initial onset; states bleeding was controlled in ED. Bleeding started again after taking a bite of his breakfast and he has been unable to control it since. He  denies dizziness, lightheadeness and SOB. Pt is currently on xarelto for AFIB.  No alleviating factors noted for this episode.   Past Medical History  Diagnosis Date  . Chronic atrial fibrillation     2D echo  09/23/2006 LA and right atrium severely dilated, moderate pulmonary hypertension  . Hypertension    History reviewed. No pertinent past surgical history. Family History  Problem Relation Age of Onset  . Cancer Mother   . Heart disease Father   . Emphysema Father   . Heart disease Brother    History  Substance Use Topics  . Smoking status: Former Research scientist (life sciences)  . Smokeless tobacco: Not on file     Comment: quit smoking in his 65's  . Alcohol Use: Yes    Review of Systems  Respiratory: Negative for shortness of breath.   Skin: Positive for wound.  Neurological: Negative for dizziness and light-headedness.  Hematological: Bruises/bleeds easily.  All other systems reviewed and are negative.     Allergies  Review of patient's allergies indicates no known allergies.  Home Medications   Prior to Admission medications   Medication Sig Start Date End Date Taking?  Authorizing Provider  aspirin EC 81 MG tablet Take 81 mg by mouth daily.   Yes Historical Provider, MD  atenolol (TENORMIN) 100 MG tablet TAKE ONE (1) TABLET EACH DAY 03/16/14  Yes Pixie Casino, MD  B Complex-C (B-COMPLEX WITH VITAMIN C) tablet Take 1 tablet by mouth daily.   Yes Historical Provider, MD  cloNIDine (CATAPRES) 0.2 MG tablet TAKE ONE TABLET TWICE DAILY 06/09/13  Yes Pixie Casino, MD  DILT-XR 240 MG 24 hr capsule TAKE ONE CAPSULE BY MOUTH DAILY 11/05/13  Yes Pixie Casino, MD  dorzolamide-timolol (COSOPT) 22.3-6.8 MG/ML ophthalmic solution Place 1 drop into both eyes 2 (two) times daily. 06/19/12  Yes Historical Provider, MD  latanoprost (XALATAN) 0.005 % ophthalmic solution Place 1 drop into both eyes at bedtime.   Yes Historical Provider, MD  lisinopril-hydrochlorothiazide (PRINZIDE,ZESTORETIC) 20-25 MG per tablet TAKE ONE (1) TABLET EACH DAY 04/12/13  Yes Pixie Casino, MD  Multiple Vitamin (MULTIVITAMIN WITH MINERALS) TABS tablet Take 1 tablet by mouth daily.   Yes Historical Provider, MD  rivaroxaban (XARELTO) 20 MG TABS tablet TAKE ONE (1) TABLET EACH DAY 03/22/14  Yes Pixie Casino, MD   BP 135/85 mmHg  Pulse 65  Temp(Src) 98 F (36.7 C) (Oral)  Resp 20  SpO2 99% Physical Exam  Constitutional: He is oriented to person, place, and time. He appears well-developed and well-nourished. No distress.  HENT:  Head: Normocephalic and atraumatic.  Mouth/Throat: Oropharynx is clear and moist. No oropharyngeal exudate.  Eyes: Conjunctivae and EOM are normal. Pupils are equal, round, and reactive to light.  Neck: Normal range of motion. Neck supple.  No meningismus.  Cardiovascular: An irregularly irregular rhythm present.  Pulmonary/Chest: Effort normal and breath sounds normal. No respiratory distress.  Abdominal: Soft. There is no tenderness. There is no rebound and no guarding.  Musculoskeletal: Normal range of motion. He exhibits no edema or tenderness.   Neurological: He is alert and oriented to person, place, and time. No cranial nerve deficit. He exhibits normal muscle tone. Coordination normal.  No ataxia on finger to nose bilaterally. No pronator drift. 5/5 strength throughout. CN 2-12 intact. Negative Romberg. Equal grip strength. Sensation intact. Gait is normal.   Skin:  Ulcerated lesion to right cheek with active bleeding from center    Psychiatric: He has a normal mood and affect. His behavior is normal.  Nursing note and vitals reviewed.   ED Course  Cauterization Date/Time: 04/30/2014 3:37 PM Performed by: Zachary Arellano Authorized by: Zachary Arellano Consent: Verbal consent obtained. Risks and benefits: risks, benefits and alternatives were discussed Consent given by: patient Patient understanding: patient states understanding of the procedure being performed Time out: Immediately prior to procedure a "time out" was called to verify the correct patient, procedure, equipment, support staff and site/side marked as required. Preparation: Patient was prepped and draped in the usual sterile fashion. Local anesthesia used: yes Anesthesia: local infiltration Local anesthetic: lidocaine 1% with epinephrine Anesthetic total: 4 ml Patient sedated: no Patient tolerance: Patient tolerated the procedure well with no immediate complications     DIAGNOSTIC STUDIES:  Oxygen Saturation is 100% on RA, normal by my interpretation.    COORDINATION OF CARE:  1:20 PM Will cauterize site to control bleeding. Discussed treatment plan with pt at bedside and pt agreed to plan.  Labs Review Labs Reviewed  CBC WITH DIFFERENTIAL - Abnormal; Notable for the following:    RBC 3.89 (*)    HCT 37.6 (*)    Monocytes Relative 13 (*)    Monocytes Absolute 1.2 (*)    All other components within normal limits  PROTIME-INR - Abnormal; Notable for the following:    Prothrombin Time 20.0 (*)    INR 1.69 (*)    All other components within  normal limits    Imaging Review No results found.   EKG Interpretation None      MDM   Final diagnoses:  Bleeding from wound   Active bleeding from the wound to face. Seen last night for same. Patient is on xarelto. Denies any dizziness, lightheadedness, chest pain or shortness of breath.  Wound was cauterized with battery-powered cautery. Quick clot dressing was applied. Pressure dressing placed.  Bleeding subsided in the ED. Monitored in the ED for 1 hour with no recurrence of bleeding. Hemoglobin decreased 2 g from last night. No indication for transfusion.  Patient instructed to hold Xarelto today and tomorrow. Follow up with PCP on Monday.  Bleeding precautions given. Return to the ED if persistent bleeding after 30 minutes of holding pressure.   Zachary Essex, MD 04/30/14 [phone]

## 2014-05-01 ENCOUNTER — Other Ambulatory Visit: Payer: Self-pay | Admitting: Internal Medicine

## 2014-05-02 ENCOUNTER — Telehealth: Payer: Self-pay | Admitting: Internal Medicine

## 2014-05-02 NOTE — Telephone Encounter (Signed)
Returned cal to patient no answer.LMTC. 

## 2014-05-02 NOTE — Telephone Encounter (Signed)
Pt called in stating that yesterday he started hemorrhaging  and he feels that the cause was his Xarelto. He stated that he went to the Ed yesterday and then spoke with Richardson Dopp and he suggested that he follow up with the doctor. Please call  Thanks

## 2014-05-02 NOTE — Telephone Encounter (Signed)
Returned call to patient Dr.Hilty advised needs to stay on xarelto.Advised to resume tonight.Advised to see PCP if continues to having bleeding.Patient stated he made appointment with dermatologist.

## 2014-05-02 NOTE — Telephone Encounter (Signed)
Returned call to patient he stated he went to [facility] ER twice this weekend with bleeding on a place on his chin.Stated bleeding was stopped,was told to hold xarelto and restart tonight.Stated he wanted to ask Dr.Hilty if ok to start [place].Stated he wanted to know if he needs to see Dr.Hilty.Also want to know if he needs to take another blood thinner.Advised if bleeding stopped ok to restart xarelto.Advised if he is doing ok he needs to keep appointment as planned.  Message sent to Dr.Hilty for advice.

## 2014-05-02 NOTE — Telephone Encounter (Signed)
Thanks .Marland Kitchen That's perfect advice.  Dr. Lemmie Evens

## 2014-05-02 NOTE — Telephone Encounter (Signed)
I'm aware of this. I would not change Xarelto because of this. He should see PCP or somewhat about the area that is bleeding to try to treat the cause (which is not xarelto).  He could resume tonight as instructed.  Dr. Lemmie Evens

## 2014-06-06 ENCOUNTER — Other Ambulatory Visit: Payer: Self-pay | Admitting: Internal Medicine

## 2014-06-06 NOTE — Telephone Encounter (Signed)
Rx(s) sent to pharmacy electronically.  

## 2014-06-18 ENCOUNTER — Other Ambulatory Visit: Payer: Self-pay | Admitting: Internal Medicine

## 2014-06-20 NOTE — Telephone Encounter (Signed)
Rx(s) sent to pharmacy electronically.  

## 2014-06-27 ENCOUNTER — Other Ambulatory Visit: Payer: Self-pay | Admitting: Internal Medicine

## 2014-06-27 NOTE — Telephone Encounter (Signed)
Rx has been sent to the pharmacy electronically. ° °

## 2014-07-07 ENCOUNTER — Other Ambulatory Visit: Payer: Self-pay | Admitting: *Deleted

## 2014-07-07 MED ORDER — RIVAROXABAN 20 MG PO TABS: 20.0000 mg | ORAL_TABLET | Freq: Every day | ORAL | Status: DC

## 2014-07-07 NOTE — Telephone Encounter (Signed)
Walk in sample request. Xarelto given.

## 2014-09-29 ENCOUNTER — Other Ambulatory Visit: Payer: Self-pay | Admitting: Internal Medicine

## 2014-09-30 NOTE — Telephone Encounter (Signed)
Rx(s) sent to pharmacy electronically.  

## 2014-11-21 ENCOUNTER — Telehealth: Payer: Self-pay | Admitting: Internal Medicine

## 2014-11-21 ENCOUNTER — Encounter: Payer: Self-pay | Admitting: Internal Medicine

## 2014-11-25 NOTE — Telephone Encounter (Signed)
Close encounter 

## 2014-12-06 ENCOUNTER — Ambulatory Visit: Payer: 59 | Admitting: Internal Medicine

## 2014-12-20 ENCOUNTER — Other Ambulatory Visit: Payer: Self-pay | Admitting: Internal Medicine

## 2015-01-12 ENCOUNTER — Ambulatory Visit (INDEPENDENT_AMBULATORY_CARE_PROVIDER_SITE_OTHER): Payer: Medicare Other | Admitting: Internal Medicine

## 2015-01-12 ENCOUNTER — Encounter: Payer: Self-pay | Admitting: Internal Medicine

## 2015-01-12 VITALS — BP 148/98 | HR 62 | Ht 70.0 in | Wt 166.6 lb

## 2015-01-12 DIAGNOSIS — Z7901 Long term (current) use of anticoagulants: Secondary | ICD-10-CM | POA: Diagnosis not present

## 2015-01-12 DIAGNOSIS — I482 Chronic atrial fibrillation: Secondary | ICD-10-CM | POA: Diagnosis not present

## 2015-01-12 DIAGNOSIS — I10 Essential (primary) hypertension: Secondary | ICD-10-CM | POA: Diagnosis not present

## 2015-01-12 DIAGNOSIS — I4821 Permanent atrial fibrillation: Secondary | ICD-10-CM

## 2015-01-12 NOTE — Patient Instructions (Signed)
Dr Debara Pickett recommends that you check your blood pressure daily for a week. Please use the blood pressure log provided. Call us back to speak with a nurse regarding these blood pressure readings.  Dr Debara Pickett recommends that you schedule a follow-up appointment in 1 year. You will receive a reminder letter in the mail two months in advance. If you don't receive a letter, please call our office to schedule the follow-up appointment.

## 2015-01-15 NOTE — Progress Notes (Signed)
OFFICE NOTE  Chief Complaint:  Routine follow-up  Primary Care Physician: Purvis Kilts, MD  HPI:  Zachary Arellano  is a 81 year old gentleman with history of chronic atrial fibrillation and hypertension which has been well controlled. Recently, he was put on Xarelto which he seems to be tolerating very well. I had also decreased his aspirin to 81 mg daily. With regards to blood pressure control, he is on several medications. Blood pressure has remained well controlled. He has managed to lose a couple of pounds and remains active and denies any chest pain, worsening shortness of breath, palpitations, presyncope or syncopal symptoms.   Zachary Arellano is doing quite well. He continues to be active. He replaced golf just a few days ago without any problems. Denies any chest pain or shortness of breath. He's had no bleeding issues on Zarrella 2 although was concerned because his cousin had a significant intra-abdominal hemorrhage on result oh requiring transfusion. He's done well and we should continue this medication. He remains in A. fib and is permanently which puts him at higher risk of stroke. Blood pressure is well controlled.  Zachary Arellano returns today and is doing well. He is asymptomatic. His energy level is good, he remains active. He denies chest pain or dyspnea. eKG shows permanent a-fib. No bleeding problems on Xarelto.  PMHx:  Past Medical History  Diagnosis Date  . Chronic atrial fibrillation     2D echo  09/23/2006 LA and right atrium severely dilated, moderate pulmonary hypertension  . Hypertension     No past surgical history on file.  FAMHx:  Family History  Problem Relation Age of Onset  . Cancer Mother   . Heart disease Father   . Emphysema Father   . Heart disease Brother     SOCHx:   reports that he has quit smoking. He does not have any smokeless tobacco history on file. He reports that he drinks alcohol. He reports that he does not use illicit  drugs.  ALLERGIES:  No Known Allergies  ROS: A comprehensive review of systems was negative.  HOME MEDS: Current Outpatient Prescriptions  Medication Sig Dispense Refill  . aspirin EC 81 MG tablet Take 81 mg by mouth daily.    Marland Kitchen atenolol (TENORMIN) 100 MG tablet TAKE ONE TABLET ONCE DAILY 90 tablet 0  . B Complex-C (B-COMPLEX WITH VITAMIN C) tablet Take 1 tablet by mouth daily.    . cloNIDine (CATAPRES) 0.2 MG tablet Take 1 tablet (0.2 mg total) by mouth 2 (two) times daily. 60 tablet 10  . diltiazem (CARDIZEM CD) 240 MG 24 hr capsule TAKE ONE CAPSULE BY MOUTH DAILY 30 capsule 10  . dorzolamide-timolol (COSOPT) 22.3-6.8 MG/ML ophthalmic solution Place 1 drop into both eyes 2 (two) times daily.    Marland Kitchen latanoprost (XALATAN) 0.005 % ophthalmic solution Place 1 drop into both eyes at bedtime.    Marland Kitchen lisinopril-hydrochlorothiazide (PRINZIDE,ZESTORETIC) 20-25 MG per tablet Take 1 tablet by mouth daily. 90 tablet 2  . Multiple Vitamin (MULTIVITAMIN WITH MINERALS) TABS tablet Take 1 tablet by mouth daily.    . rivaroxaban (XARELTO) 20 MG TABS tablet TAKE ONE (1) TABLET EACH DAY 25 tablet 0  . rivaroxaban (XARELTO) 20 MG TABS tablet Take 1 tablet (20 mg total) by mouth daily with supper. 20 tablet 0   No current facility-administered medications for this visit.    LABS/IMAGING: No results found for this or any previous visit (from the past 48 hour(s)). No results found.  VITALS: BP 148/98 mmHg  Pulse 62  Ht 5\' 10"  (1.778 m)  Wt 166 lb 9.6 oz (75.569 kg)  BMI 23.90 kg/m2  EXAM: General appearance: alert and no distress Neck: no adenopathy, no carotid bruit, no JVD, supple, symmetrical, trachea midline and thyroid not enlarged, symmetric, no tenderness/mass/nodules Lungs: clear to auscultation bilaterally Heart: irregularly irregular, S1, S2 normal, no murmur, click, rub or gallop Abdomen: soft, non-tender; bowel sounds normal; no masses,  no organomegaly Extremities: extremities normal,  atraumatic, no cyanosis or edema Pulses: 2+ and symmetric Skin: Skin color, texture, turgor normal. No rashes or lesions Neurologic: Grossly normal psych: Pleasant, normal mood  EKG: Atrial fibrillation at 62  ASSESSMENT: 1. Chronic atrial fibrillation - on Xarelto without bleeding 2. Hypertension-well controlled 3. Encounter for long-term anticoagulation monitoring.  PLAN: 1.   [name] is doing well on Xarelto for atrial fibrillation. Blood pressure is borderline high.  I would like for him to check it at home for a week and report back the numbers. There no bleeding issues on Xarelto. Overall he feels well. I recommend continuing his current medications we'll see him back in 6 months.  Zachary Casino, MD, [facility] Attending Cardiologist [place] 01/15/2015, 10:06 PM

## 2015-01-19 ENCOUNTER — Telehealth: Payer: Self-pay | Admitting: Internal Medicine

## 2015-01-19 MED ORDER — RIVAROXABAN 20 MG PO TABS: ORAL_TABLET | ORAL | Status: DC

## 2015-01-19 NOTE — Telephone Encounter (Signed)
Please call,needs to give you his blood pressure reading for a week.

## 2015-01-19 NOTE — Telephone Encounter (Signed)
Pt informed me he is going to drop off list of his BP readings tomorrow, and also asked if we had Xarelto samples on-hand. Informed him I had samples and would leave for him to pick up tomorrow. Pt voiced thanks, no further questions.

## 2015-01-20 ENCOUNTER — Telehealth: Payer: Self-pay | Admitting: *Deleted

## 2015-01-20 NOTE — Telephone Encounter (Signed)
Home readings look much better. No changes to his meds.  Thanks.  Dr. Lemmie Evens

## 2015-01-20 NOTE — Telephone Encounter (Signed)
Spoke to patient, he voiced understanding of Dr. Lysbeth Penner recommendations.

## 2015-01-20 NOTE — Telephone Encounter (Signed)
No answer on home line. LMTCB on cell.

## 2015-01-20 NOTE — Telephone Encounter (Signed)
Patient brought in list of 1 week of BPs for Dr. Lysbeth Penner review.  9/30 - 145/80 HR 61 9/30 - 137/80 HR 52 10/1 - 125/76 HR 60 10/2 - 142/96 HR 72 10/3 - 125/93 HR 66 10/4 - 119/67 HR 59 10/4 - 102/53 HR 55 10/5 - 108/76 HR 48 10/6 - 102/63 HR 52   Sent to Dr. Debara Pickett.

## 2015-02-06 ENCOUNTER — Telehealth: Payer: Self-pay | Admitting: Internal Medicine

## 2015-02-06 NOTE — Telephone Encounter (Signed)
Spoke with patient. Unsure who called him - no note in EPIC. Apologized for the inconvenience.

## 2015-02-06 NOTE — Telephone Encounter (Signed)
Pt says that he received a call from our office this morning and he isn't sure who called. Please f/u with pt  Thanks

## 2015-03-16 ENCOUNTER — Other Ambulatory Visit: Payer: Self-pay | Admitting: Internal Medicine

## 2015-03-16 NOTE — Telephone Encounter (Signed)
REFILL 

## 2015-03-23 ENCOUNTER — Other Ambulatory Visit: Payer: Self-pay | Admitting: Internal Medicine

## 2015-03-23 NOTE — Telephone Encounter (Signed)
Rx request sent to pharmacy.  

## 2015-04-07 ENCOUNTER — Other Ambulatory Visit: Payer: Self-pay | Admitting: Internal Medicine

## 2015-04-07 NOTE — Telephone Encounter (Signed)
Rx request sent to pharmacy.  

## 2015-04-13 ENCOUNTER — Other Ambulatory Visit: Payer: Self-pay | Admitting: Internal Medicine

## 2015-04-13 NOTE — Telephone Encounter (Signed)
Rx request sent to pharmacy.  

## 2015-05-15 ENCOUNTER — Other Ambulatory Visit: Payer: Self-pay | Admitting: Internal Medicine

## 2015-05-15 NOTE — Telephone Encounter (Signed)
Rx request sent to pharmacy.  

## 2015-06-29 ENCOUNTER — Telehealth: Payer: Self-pay

## 2015-06-29 NOTE — Telephone Encounter (Signed)
Patient walked in office requesting xarelto 20 mg samples.Samples of xarelto 20 mg given to patient.

## 2015-09-08 ENCOUNTER — Emergency Department (HOSPITAL_COMMUNITY): Payer: Medicare Other

## 2015-09-08 ENCOUNTER — Encounter (HOSPITAL_COMMUNITY): Payer: Self-pay

## 2015-09-08 ENCOUNTER — Emergency Department (HOSPITAL_COMMUNITY)
Admission: EM | Admit: 2015-09-08 | Discharge: 2015-09-08 | Disposition: A | Discharge status: Home / Self Care | Payer: Medicare Other | Attending: Emergency Medicine | Admitting: Emergency Medicine

## 2015-09-08 DIAGNOSIS — Y929 Unspecified place or not applicable: Secondary | ICD-10-CM | POA: Insufficient documentation

## 2015-09-08 DIAGNOSIS — Z791 Long term (current) use of non-steroidal anti-inflammatories (NSAID): Secondary | ICD-10-CM | POA: Insufficient documentation

## 2015-09-08 DIAGNOSIS — I10 Essential (primary) hypertension: Secondary | ICD-10-CM | POA: Diagnosis not present

## 2015-09-08 DIAGNOSIS — Y92009 Unspecified place in unspecified non-institutional (private) residence as the place of occurrence of the external cause: Secondary | ICD-10-CM

## 2015-09-08 DIAGNOSIS — Z23 Encounter for immunization: Secondary | ICD-10-CM | POA: Insufficient documentation

## 2015-09-08 DIAGNOSIS — Y999 Unspecified external cause status: Secondary | ICD-10-CM | POA: Diagnosis not present

## 2015-09-08 DIAGNOSIS — Y939 Activity, unspecified: Secondary | ICD-10-CM | POA: Insufficient documentation

## 2015-09-08 DIAGNOSIS — W06XXXA Fall from bed, initial encounter: Secondary | ICD-10-CM | POA: Insufficient documentation

## 2015-09-08 DIAGNOSIS — S01112A Laceration without foreign body of left eyelid and periocular area, initial encounter: Secondary | ICD-10-CM | POA: Diagnosis not present

## 2015-09-08 DIAGNOSIS — Z87891 Personal history of nicotine dependence: Secondary | ICD-10-CM | POA: Diagnosis not present

## 2015-09-08 DIAGNOSIS — I482 Chronic atrial fibrillation, unspecified: Secondary | ICD-10-CM

## 2015-09-08 DIAGNOSIS — Z79899 Other long term (current) drug therapy: Secondary | ICD-10-CM | POA: Diagnosis not present

## 2015-09-08 DIAGNOSIS — H1132 Conjunctival hemorrhage, left eye: Secondary | ICD-10-CM | POA: Diagnosis not present

## 2015-09-08 DIAGNOSIS — Z7901 Long term (current) use of anticoagulants: Secondary | ICD-10-CM | POA: Diagnosis not present

## 2015-09-08 DIAGNOSIS — S0181XA Laceration without foreign body of other part of head, initial encounter: Secondary | ICD-10-CM

## 2015-09-08 DIAGNOSIS — W19XXXA Unspecified fall, initial encounter: Secondary | ICD-10-CM

## 2015-09-08 MED ORDER — LIDOCAINE-EPINEPHRINE-TETRACAINE (LET) SOLUTION
3.0000 mL | Freq: Once | NASAL | Status: AC
  Administered 2015-09-08: 3 mL via TOPICAL
  Filled 2015-09-08: qty 3

## 2015-09-08 MED ORDER — TETANUS-DIPHTH-ACELL PERTUSSIS 5-2.5-18.5 LF-MCG/0.5 IM SUSP
0.5000 mL | Freq: Once | INTRAMUSCULAR | Status: AC
  Administered 2015-09-08: 0.5 mL via INTRAMUSCULAR
  Filled 2015-09-08: qty 0.5

## 2015-09-08 MED ORDER — LIDOCAINE HCL (PF) 2 % IJ SOLN
INTRAMUSCULAR | Status: AC
  Filled 2015-09-08: qty 10

## 2015-09-08 NOTE — ED Notes (Signed)
Pt states he fell while getting up to go to the bathroom, states he got tangled up in the bed covers and fell hitting the left side of his head/face on a wicker chair.  Pt denies loc, states he took a shower and got ready to come to the e.d.  Pt denies pain.  Pt has mild bleeding and bruising to outer portion of the left eye.

## 2015-09-08 NOTE — ED Provider Notes (Signed)
CSN: [id number]     Arrival date & time 09/08/15  [id number] History   First MD Initiated Contact with Patient 09/08/15 [phone]     Chief Complaint  Patient presents with  . Fall  . Facial Injury     (Consider location/radiation/quality/duration/timing/severity/associated sxs/prior Treatment) The history is provided by the patient.  82 year old male who is anticoagulated on rivaroxaban for atrial fibrillation fell getting out of bed tonight suffering injuries around his left eye. He denies loss of consciousness, vision change, dizziness, nausea. He does not known his last tetanus immunization was. He denies other injury.  Past Medical History  Diagnosis Date  . Chronic atrial fibrillation     2D echo  09/23/2006 LA and right atrium severely dilated, moderate pulmonary hypertension  . Hypertension    No past surgical history on file. Family History  Problem Relation Age of Onset  . Cancer Mother   . Heart disease Father   . Emphysema Father   . Heart disease Brother    Social History  Substance Use Topics  . Smoking status: Former Research scientist (life sciences)  . Smokeless tobacco: Not on file     Comment: quit smoking in his 75's  . Alcohol Use: Yes    Review of Systems  All other systems reviewed and are negative.     Allergies  Review of patient's allergies indicates no known allergies.  Home Medications   Prior to Admission medications   Medication Sig Start Date End Date Taking? Authorizing Provider  aspirin EC 81 MG tablet Take 81 mg by mouth daily.    Historical Provider, MD  atenolol (TENORMIN) 100 MG tablet TAKE ONE (1) TABLET EACH DAY 03/23/15   Pixie Casino, MD  B Complex-C (B-COMPLEX WITH VITAMIN C) tablet Take 1 tablet by mouth daily.    Historical Provider, MD  cloNIDine (CATAPRES) 0.2 MG tablet TAKE ONE TABLET BY MOUTH TWICE A DAY 05/15/15   Pixie Casino, MD  diltiazem (CARDIZEM CD) 240 MG 24 hr capsule TAKE ONE (1) CAPSULE EACH DAY 04/07/15   Pixie Casino, MD   dorzolamide-timolol (COSOPT) 22.3-6.8 MG/ML ophthalmic solution Place 1 drop into both eyes 2 (two) times daily. 06/19/12   Historical Provider, MD  latanoprost (XALATAN) 0.005 % ophthalmic solution Place 1 drop into both eyes at bedtime.    Historical Provider, MD  lisinopril-hydrochlorothiazide (PRINZIDE,ZESTORETIC) 20-25 MG tablet TAKE ONE (1) TABLET EACH DAY 03/16/15   Pixie Casino, MD  Multiple Vitamin (MULTIVITAMIN WITH MINERALS) TABS tablet Take 1 tablet by mouth daily.    Historical Provider, MD  XARELTO 20 MG TABS tablet TAKE ONE (1) TABLET EACH DAY 04/13/15   Pixie Casino, MD   BP 161/89 mmHg  Pulse 65  Temp(Src) 97.7 F (36.5 C) (Oral)  Resp 18  Ht 5\' 10"  (1.778 m)  Wt 170 lb (77.111 kg)  BMI 24.39 kg/m2  SpO2 98% Physical Exam  Nursing note and vitals reviewed.  82 year old male, resting comfortably and in no acute distress. Vital signs are significant for hypertension. Oxygen saturation is 98%, which is normal. Head is normocephalic. There is a laceration above the left eye and lateral to the left eye. Some conjunctival hemorrhages present inferiorly on the left eye. There is a mild left periorbital ecchymosis. There is no step off of the orbital rim. No other facial trauma is seen. PERRLA, EOMI. Oropharynx is clear. Neck is nontender without adenopathy or JVD. Back is nontender and there is no CVA tenderness. Lungs are  clear without rales, wheezes, or rhonchi. Chest is nontender. Heart has an irregular rhythm without murmur. Abdomen is soft, flat, nontender without masses or hepatosplenomegaly and peristalsis is normoactive. Extremities have no cyanosis or edema, full range of motion is present. Skin is warm and dry without rash. Neurologic: Mental status is normal, cranial nerves are intact, there are no motor or sensory deficits.  ED Course  Procedures (including critical care time)  Imaging Review Ct Head Wo Contrast  09/08/2015  CLINICAL DATA:  Fall, hit left  side of head and face. EXAM: CT HEAD WITHOUT CONTRAST CT CERVICAL SPINE WITHOUT CONTRAST TECHNIQUE: Multidetector CT imaging of the head and cervical spine was performed following the standard protocol without intravenous contrast. Multiplanar CT image reconstructions of the cervical spine were also generated. COMPARISON:  12/02/2006 FINDINGS: CT HEAD FINDINGS Old left cerebellar infarct, stable. No acute intracranial abnormality. Specifically, no hemorrhage, hydrocephalus, mass lesion, acute infarction, or significant intracranial injury. No acute calvarial abnormality. Visualized paranasal sinuses and mastoids clear. Orbital soft tissues unremarkable. Soft tissue swelling over the left orbit and forehead. CT CERVICAL SPINE FINDINGS Normal alignment. Degenerative disc and facet disease throughout the cervical spine. Prevertebral soft tissues are normal. No fracture. No epidural or paraspinal hematoma. IMPRESSION: No acute intracranial abnormality. 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 Cervical spondylosis.  No acute bony abnormality. Electronically Signed   By: Rolm Baptise M.D.   On: 09/08/2015 07:17   Ct Cervical Spine Wo Contrast  09/08/2015  CLINICAL DATA:  Fall, hit left side of head and face. EXAM: CT HEAD WITHOUT CONTRAST CT CERVICAL SPINE WITHOUT CONTRAST TECHNIQUE: Multidetector CT imaging of the head and cervical spine was performed following the standard protocol without intravenous contrast. Multiplanar CT image reconstructions of the cervical spine were also generated. COMPARISON:  12/02/2006 FINDINGS: CT HEAD FINDINGS Old left cerebellar infarct, stable. No acute intracranial abnormality. Specifically, no hemorrhage, hydrocephalus, mass lesion, acute infarction, or significant intracranial injury. No acute calvarial abnormality. Visualized paranasal sinuses and mastoids clear. Orbital soft tissues unremarkable. Soft tissue swelling over the left orbit and forehead. CT CERVICAL SPINE FINDINGS Normal  alignment. Degenerative disc and facet disease throughout the cervical spine. Prevertebral soft tissues are normal. No fracture. No epidural or paraspinal hematoma. IMPRESSION: No acute intracranial abnormality. 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 Cervical spondylosis.  No acute bony abnormality. Electronically Signed   By: Rolm Baptise M.D.   On: 09/08/2015 07:17   I have personally reviewed and evaluated these images as part of my medical decision-making.   MDM   Final diagnoses:  Fall at home, initial encounter  Laceration of face, initial encounter  Subconjunctival hemorrhage, left  Atrial fibrillation, chronic (HCC)  Chronic anticoagulation    Fall with facial lacerations, some conjunctival hemorrhage, periorbital ecchymosis. Tdap booster is given. He is sent for CT of head and cervical spine.  CT scans show no acute injury. Bleeding has continued from laceration lateral to the left eye, so it is not amenable to treatment with tissue adhesive. Evalee Jefferson PA-C will perform suture repair.  Delora Fuel, MD [phone]

## 2015-09-08 NOTE — ED Provider Notes (Signed)
I was asked to suture this patient's facial  lacerations. I had no other role in evaluation of this patient.  He endorsed fear of needles, he was given LET topical anesthesia to the wound sites.    LACERATION REPAIR  Eye brow left Performed by: Brinn Westby Authorized by: Evalee Jefferson Consent: Verbal consent obtained. Risks and benefits: risks, benefits and alternatives were discussed Consent given by: patient Patient identity confirmed: provided demographic data Prepped and Draped in normal sterile fashion Wound explored  Laceration Location: Left eyebrow  Laceration Length: 2 cm  No Foreign Bodies seen or palpated  Anesthesia: local infiltration  Local anesthetic: lidocaine 2% without epinephrine  Anesthetic total: 1 ml  Irrigation method: syringe Amount of cleaning: standard  Skin closure: ethilon 6-0  Number of sutures: 4  Technique: simple interupted  Patient tolerance: Patient tolerated the procedure well with no immediate complications.    LACERATION REPAIR  Lateral eye/temporal left Performed by: Yalissa Fink Authorized by: Evalee Jefferson Consent: Verbal consent obtained. Risks and benefits: risks, benefits and alternatives were discussed Consent given by: patient Patient identity confirmed: provided demographic data Prepped and Draped in normal sterile fashion Wound explored  Laceration Location: Left lateral eye  Laceration Length: 2cm  No Foreign Bodies seen or palpated  Anesthesia: local infiltration  Local anesthetic: lidocaine 1% without epinephrine  Anesthetic total: 1 ml  Irrigation method: syringe Amount of cleaning: standard  Skin closure: ethilon 6-0  Number of sutures: 4  Technique: simple interupted  Patient tolerance: Patient tolerated the procedure well with no immediate complications.   Evalee Jefferson, PA-C [id number]  Delora Fuel, MD 0000000 [id number]

## 2015-09-08 NOTE — Discharge Instructions (Signed)
Suture out in 5 days. Keep clean and dry.

## 2015-09-08 NOTE — ED Notes (Signed)
Pt states he fell getting out of bed. Hit head on old wicker chair. Pt denies LOC.

## 2015-10-16 ENCOUNTER — Emergency Department (HOSPITAL_COMMUNITY): Payer: Medicare Other

## 2015-10-16 ENCOUNTER — Emergency Department (HOSPITAL_COMMUNITY)
Admission: EM | Admit: 2015-10-16 | Discharge: 2015-10-16 | Disposition: A | Discharge status: Home / Self Care | Payer: Medicare Other | Attending: Emergency Medicine | Admitting: Emergency Medicine

## 2015-10-16 ENCOUNTER — Encounter (HOSPITAL_COMMUNITY): Payer: Self-pay | Admitting: Emergency Medicine

## 2015-10-16 DIAGNOSIS — Z85828 Personal history of other malignant neoplasm of skin: Secondary | ICD-10-CM | POA: Insufficient documentation

## 2015-10-16 DIAGNOSIS — Z87891 Personal history of nicotine dependence: Secondary | ICD-10-CM | POA: Insufficient documentation

## 2015-10-16 DIAGNOSIS — I10 Essential (primary) hypertension: Secondary | ICD-10-CM | POA: Insufficient documentation

## 2015-10-16 DIAGNOSIS — R42 Dizziness and giddiness: Secondary | ICD-10-CM | POA: Insufficient documentation

## 2015-10-16 DIAGNOSIS — Z7982 Long term (current) use of aspirin: Secondary | ICD-10-CM | POA: Diagnosis not present

## 2015-10-16 DIAGNOSIS — Z79899 Other long term (current) drug therapy: Secondary | ICD-10-CM | POA: Diagnosis not present

## 2015-10-16 DIAGNOSIS — I482 Chronic atrial fibrillation: Secondary | ICD-10-CM | POA: Diagnosis not present

## 2015-10-16 HISTORY — DX: Unspecified glaucoma: H40.9

## 2015-10-16 LAB — CBC
HCT: 40.8 % (ref 39.0–52.0)
Hemoglobin: 14 g/dL (ref 13.0–17.0)
MCH: 32.9 pg (ref 26.0–34.0)
MCHC: 34.3 g/dL (ref 30.0–36.0)
MCV: 96 fL (ref 78.0–100.0)
PLATELETS: 210 10*3/uL (ref 150–400)
RBC: 4.25 MIL/uL (ref 4.22–5.81)
RDW: 13.3 % (ref 11.5–15.5)
WBC: 8 10*3/uL (ref 4.0–10.5)

## 2015-10-16 LAB — BASIC METABOLIC PANEL
Anion gap: 10 (ref 5–15)
BUN: 19 mg/dL (ref 6–20)
CO2: 29 mmol/L (ref 22–32)
CREATININE: 0.94 mg/dL (ref 0.61–1.24)
Calcium: 9.6 mg/dL (ref 8.9–10.3)
Chloride: 89 mmol/L — ABNORMAL LOW (ref 101–111)
GFR calc Af Amer: >60 mL/min (ref >60)
GFR calc non Af Amer: >60 mL/min (ref >60)
Glucose, Bld: 103 mg/dL — ABNORMAL HIGH (ref 65–99)
Potassium: 3.5 mmol/L (ref 3.5–5.1)
SODIUM: 128 mmol/L — AB (ref 135–145)

## 2015-10-16 LAB — CBG MONITORING, ED: Glucose-Capillary: 113 mg/dL — ABNORMAL HIGH (ref 65–99)

## 2015-10-16 MED ORDER — SODIUM CHLORIDE 0.9 % IV SOLN
INTRAVENOUS | Status: DC
  Administered 2015-10-16: 17:00:00 via INTRAVENOUS

## 2015-10-16 MED ORDER — SODIUM CHLORIDE 0.9 % IV BOLUS (SEPSIS)
500.0000 mL | Freq: Once | INTRAVENOUS | Status: AC
Start: 2015-10-16 — End: 2015-10-16
  Administered 2015-10-16: 500 mL via INTRAVENOUS

## 2015-10-16 NOTE — ED Notes (Signed)
PT reports fall with head injury on 09/08/15 and since then has had periods of dizziness with change in position from sitting to standing. Family reports pt also periods of confusion since fall and patient is on Xarelto. PT denies any pain or visual changes and is alert and oriented.

## 2015-10-16 NOTE — ED Notes (Signed)
Pt given coke to drink per doctor.

## 2015-10-16 NOTE — ED Provider Notes (Signed)
CSN: [id number]     Arrival date & time 10/16/15  1315 History   First MD Initiated Contact with Patient 10/16/15 1358     Chief Complaint  Patient presents with  . Dizziness     (Consider location/radiation/quality/duration/timing/severity/associated sxs/prior Treatment) Patient is a 82 y.o. male presenting with dizziness. The history is provided by the patient and a relative.  Dizziness Associated symptoms: weakness   Associated symptoms: no chest pain, no headaches and no shortness of breath   Patient brought in by family member. Patient since his fall with head injury on May 26 his had persistent periods of dizziness, and family stating increased confusion. Patient's balance has been a little off as well. Patient denies any visual changes. Patient denies headache. Patient's laceration that was around the left eye from the fall in May has healed well. Patient did have a head CT on that visit on May 26 without any acute findings which did show evidence of an old left cerebellar infarct. Patient is on blood thinners for atrial fib.   Past Medical History  Diagnosis Date  . Chronic atrial fibrillation (HCC)     2D echo  09/23/2006 LA and right atrium severely dilated, moderate pulmonary hypertension  . Hypertension   . Glaucoma    Past Surgical History  Procedure Laterality Date  . Skin cancer removal     Family History  Problem Relation Age of Onset  . Cancer Mother   . Heart disease Father   . Emphysema Father   . Heart disease Brother    Social History  Substance Use Topics  . Smoking status: Former Research scientist (life sciences)  . Smokeless tobacco: None     Comment: quit smoking in his 7's  . Alcohol Use: Yes    Review of Systems  Constitutional: Negative for fever.  HENT: Negative for congestion.   Eyes: Negative for visual disturbance.  Respiratory: Negative for shortness of breath.   Cardiovascular: Negative for chest pain.  Gastrointestinal: Negative for abdominal pain.   Genitourinary: Negative for dysuria.  Musculoskeletal: Negative for back pain and neck pain.  Skin: Negative for rash.  Neurological: Positive for dizziness and weakness. Negative for headaches.  Hematological: Bruises/bleeds easily.  Psychiatric/Behavioral: Negative for confusion.      Allergies  Review of patient's allergies indicates no known allergies.  Home Medications   Prior to Admission medications   Medication Sig Start Date End Date Taking? Authorizing Provider  aspirin EC 81 MG tablet Take 81 mg by mouth daily.   Yes Historical Provider, MD  atenolol (TENORMIN) 100 MG tablet TAKE ONE (1) TABLET EACH DAY 03/23/15  Yes Pixie Casino, MD  B Complex-C (B-COMPLEX WITH VITAMIN C) tablet Take 1 tablet by mouth daily.   Yes Historical Provider, MD  cloNIDine (CATAPRES) 0.2 MG tablet TAKE ONE TABLET BY MOUTH TWICE A DAY 05/15/15  Yes Pixie Casino, MD  diltiazem (CARDIZEM CD) 240 MG 24 hr capsule TAKE ONE (1) CAPSULE EACH DAY 04/07/15  Yes Pixie Casino, MD  dorzolamide-timolol (COSOPT) 22.3-6.8 MG/ML ophthalmic solution 1 drop daily.   Yes Historical Provider, MD  lisinopril-hydrochlorothiazide (PRINZIDE,ZESTORETIC) 20-25 MG tablet TAKE ONE (1) TABLET EACH DAY 03/16/15  Yes Pixie Casino, MD  Multiple Vitamin (MULTIVITAMIN WITH MINERALS) TABS tablet Take 1 tablet by mouth daily.   Yes Historical Provider, MD  Polyethyl Glycol-Propyl Glycol (SYSTANE) 0.4-0.3 % SOLN Apply 2 drops to eye 2 (two) times daily.   Yes Historical Provider, MD  XARELTO 20 MG TABS  tablet TAKE ONE (1) TABLET EACH DAY 04/13/15  Yes Pixie Casino, MD   BP 171/96 mmHg  Pulse 90  Temp(Src) 98.5 F (36.9 C) (Oral)  Resp 21  Ht 5\' 10"  (1.778 m)  Wt 74.844 kg  BMI 23.68 kg/m2  SpO2 100% Physical Exam  Constitutional: He appears well-developed and well-nourished. No distress.  HENT:  Head: Normocephalic and atraumatic.  Mouth/Throat: Oropharynx is clear and moist.  Area of old lacerations around  the left eye well healed.  Eyes: Conjunctivae and EOM are normal. Pupils are equal, round, and reactive to light.  Neck: Normal range of motion. Neck supple.  Cardiovascular: Normal rate and normal heart sounds.   Irregular rate  Pulmonary/Chest: Effort normal and breath sounds normal. No respiratory distress.  Abdominal: Soft. Bowel sounds are normal. There is no tenderness.  Musculoskeletal: He exhibits no edema.  Neurological: He is alert. No cranial nerve deficit. He exhibits normal muscle tone. Coordination normal.  Skin: Skin is warm.  Nursing note and vitals reviewed.   ED Course  Procedures (including critical care time) Labs Review Labs Reviewed  BASIC METABOLIC PANEL - Abnormal; Notable for the following:    Sodium 128 (*)    Chloride 89 (*)    Glucose, Bld 103 (*)    All other components within normal limits  CBG MONITORING, ED - Abnormal; Notable for the following:    Glucose-Capillary 113 (*)    All other components within normal limits  CBC  CBG MONITORING, ED   Results for orders placed or performed during the hospital encounter of [id number]  Basic metabolic panel  Result Value Ref Range   Sodium 128 (L) 135 - 145 mmol/L   Potassium 3.5 3.5 - 5.1 mmol/L   Chloride 89 (L) 101 - 111 mmol/L   CO2 29 22 - 32 mmol/L   Glucose, Bld 103 (H) 65 - 99 mg/dL   BUN 19 6 - 20 mg/dL   Creatinine, Ser 0.94 0.61 - 1.24 mg/dL   Calcium 9.6 8.9 - 10.3 mg/dL   GFR calc non Af Amer >60 >60 mL/min   GFR calc Af Amer >60 >60 mL/min   Anion gap 10 5 - 15  CBC  Result Value Ref Range   WBC 8.0 4.0 - 10.5 K/uL   RBC 4.25 4.22 - 5.81 MIL/uL   Hemoglobin 14.0 13.0 - 17.0 g/dL   HCT 40.8 39.0 - 52.0 %   MCV 96.0 78.0 - 100.0 fL   MCH 32.9 26.0 - 34.0 pg   MCHC 34.3 30.0 - 36.0 g/dL   RDW 13.3 11.5 - 15.5 %   Platelets 210 150 - 400 K/uL  CBG monitoring, ED  Result Value Ref Range   Glucose-Capillary 113 (H) 65 - 99 mg/dL     Imaging Review Dg Chest 2 View  10/16/2015   CLINICAL DATA:  Cough and dizziness since May, 2017 after a fall. EXAM: CHEST  2 VIEW COMPARISON:  Single-view of the chest 09/08/2006. FINDINGS: There is cardiomegaly without edema. Lungs are clear. No pneumothorax or pleural effusion. Aortic atherosclerosis is noted. No focal bony abnormality. IMPRESSION: Cardiomegaly without acute disease. Electronically Signed   By: Inge Rise M.D.   On: 10/16/2015 14:41   Ct Head Wo Contrast  10/16/2015  CLINICAL DATA:  Dizziness, head injury 5/ 26/17 EXAM: CT HEAD WITHOUT CONTRAST TECHNIQUE: Contiguous axial images were obtained from the base of the skull through the vertex without intravenous contrast. COMPARISON:  09/08/2015 FINDINGS: No skull  fracture is noted. No intracranial hemorrhage, mass effect or midline shift. No acute cortical infarction. Paranasal sinuses and mastoid air cells are unremarkable. Stable cerebral atrophy. Stable old infarct in left cerebellum. No mass lesion is noted on this unenhanced scan. Ventricular size is stable from prior exam. IMPRESSION: No acute intracranial abnormality. Stable cerebral atrophy. Ventricular size is stable from prior exam. Old infarct in left cerebellum again noted. No acute cortical infarction. Electronically Signed   By: Lahoma Crocker M.D.   On: 10/16/2015 14:51   Mr Brain Wo Contrast  10/16/2015  CLINICAL DATA:  Altered mental status and weakness for 2 days. History of prior stroke. Chronic atrial fibrillation. Hypertension. EXAM: MRI HEAD WITHOUT CONTRAST TECHNIQUE: Multiplanar, multiecho pulse sequences of the brain and surrounding structures were obtained without intravenous contrast. COMPARISON:  CT head most recent 10/16/2015 also 09/08/2015. FINDINGS: No evidence for acute infarction, hemorrhage, mass lesion, or extra-axial fluid. Global atrophy. Hydrocephalus ex vacuo. Moderately extensive T2 and FLAIR hyperintensities throughout the white matter, likely chronic microvascular ischemic change. Chronic LEFT  cerebellar infarct. Partial empty sella. No tonsillar herniation. Cervical spondylosis. Flow voids are maintained, specifically the LEFT vertebral appears patent. No significant foci of chronic hemorrhage either in the cerebral hemispheres or related to the LEFT cerebellar infarct. Extracranial soft tissues appear unremarkable. Artifactual signal loss related to dental work on the RIGHT. IMPRESSION: Atrophy and small vessel disease. Old LEFT cerebellar infarct. No acute intracranial findings. Electronically Signed   By: Staci Righter M.D.   On: 10/16/2015 16:56   I have personally reviewed and evaluated these images and lab results as part of my medical decision-making.   EKG Interpretation   Date/Time:  Monday October 16 2015 13:25:35 EDT Ventricular Rate:  59 PR Interval:    QRS Duration: 97 QT Interval:  428 QTC Calculation: 424 R Axis:   74 Text Interpretation:  Atrial fibrillation Baseline wander in lead(s) V1 V2  V3 No previous ECGs available Confirmed by Lealon Vanputten  MD, Telitha Plath ([id number]) on  10/16/2015 1:36:25 PM      MDM   Final diagnoses:  Dizziness    Patient with difficulties with some dizziness and falls now for several weeks. Patient had a fall on May 26 head CT at that time showed no acute trauma. Patient's family feels that he's had persistent dizziness and difficulty standing that is worse since the fall. Patient is on the blood thinners Xarelto.  Today patient's had extensive workup to include MRI of the brain and head CT no acute injuries. Evidence of an old left cerebellar infarct that was present in May as well. Patient's labs without significant abnormalities other than some mild hyponatremia. Patient's mentating fine. Patient moving all extremities well. Patient received some normal saline here recommend close follow-up for recheck of the   test sodium in about a week. Patient has follow-up with his regular doctor in a week. Patient is on the blood thinner for chronic atrial  fibrillation.   It is very probable that the old cerebellar infarct is playing a role with his symptoms.    Patient's vital signs here without any significant bradycardia without any of hypotension. In fact blood pressure is elevated.   Fredia Sorrow, MD 10/16/15 [phone]

## 2015-10-16 NOTE — ED Notes (Signed)
Pt transported to MRI by ethan Orcutt.

## 2015-10-16 NOTE — Discharge Instructions (Signed)
Workup extensively without any acute findings to include repeat head CT and MRI of the brain as well as labs. Also blood pressure here is fine. Would recommend follow-up with neurology and primary care doctor. There is evidence of an old cerebellar infarct not sure whether this is playing a role. But it certainly was there prior to his visit in May.  Sodium here was a little low. He had some mild normal saline replacement of the electrolytes. Would recommend follow-up recheck a basic electrolytes in particular the sodium in the next week.

## 2015-10-30 ENCOUNTER — Inpatient Hospital Stay (HOSPITAL_COMMUNITY)
Admission: EM | Admit: 2015-10-30 | Discharge: 2015-11-06 | DRG: 640 | Disposition: A | Discharge status: Home Health | Payer: Medicare Other | Attending: Internal Medicine | Admitting: Internal Medicine

## 2015-10-30 ENCOUNTER — Emergency Department (HOSPITAL_COMMUNITY): Payer: Medicare Other

## 2015-10-30 ENCOUNTER — Encounter (HOSPITAL_COMMUNITY): Payer: Self-pay

## 2015-10-30 DIAGNOSIS — I482 Chronic atrial fibrillation: Secondary | ICD-10-CM | POA: Diagnosis present

## 2015-10-30 DIAGNOSIS — E871 Hypo-osmolality and hyponatremia: Secondary | ICD-10-CM | POA: Diagnosis not present

## 2015-10-30 DIAGNOSIS — S298XXA Other specified injuries of thorax, initial encounter: Secondary | ICD-10-CM | POA: Insufficient documentation

## 2015-10-30 DIAGNOSIS — R531 Weakness: Secondary | ICD-10-CM | POA: Diagnosis not present

## 2015-10-30 DIAGNOSIS — Z8249 Family history of ischemic heart disease and other diseases of the circulatory system: Secondary | ICD-10-CM | POA: Diagnosis not present

## 2015-10-30 DIAGNOSIS — Z825 Family history of asthma and other chronic lower respiratory diseases: Secondary | ICD-10-CM | POA: Diagnosis not present

## 2015-10-30 DIAGNOSIS — I495 Sick sinus syndrome: Secondary | ICD-10-CM

## 2015-10-30 DIAGNOSIS — I4891 Unspecified atrial fibrillation: Secondary | ICD-10-CM | POA: Diagnosis present

## 2015-10-30 DIAGNOSIS — Z7901 Long term (current) use of anticoagulants: Secondary | ICD-10-CM | POA: Diagnosis not present

## 2015-10-30 DIAGNOSIS — E876 Hypokalemia: Secondary | ICD-10-CM | POA: Diagnosis not present

## 2015-10-30 DIAGNOSIS — E860 Dehydration: Secondary | ICD-10-CM | POA: Diagnosis present

## 2015-10-30 DIAGNOSIS — I110 Hypertensive heart disease with heart failure: Secondary | ICD-10-CM | POA: Diagnosis present

## 2015-10-30 DIAGNOSIS — I509 Heart failure, unspecified: Secondary | ICD-10-CM

## 2015-10-30 DIAGNOSIS — R001 Bradycardia, unspecified: Secondary | ICD-10-CM | POA: Insufficient documentation

## 2015-10-30 DIAGNOSIS — M25551 Pain in right hip: Secondary | ICD-10-CM | POA: Diagnosis present

## 2015-10-30 DIAGNOSIS — W19XXXA Unspecified fall, initial encounter: Secondary | ICD-10-CM | POA: Diagnosis present

## 2015-10-30 DIAGNOSIS — I5031 Acute diastolic (congestive) heart failure: Secondary | ICD-10-CM

## 2015-10-30 DIAGNOSIS — S20219A Contusion of unspecified front wall of thorax, initial encounter: Secondary | ICD-10-CM | POA: Diagnosis present

## 2015-10-30 DIAGNOSIS — I10 Essential (primary) hypertension: Secondary | ICD-10-CM | POA: Diagnosis present

## 2015-10-30 DIAGNOSIS — S20211A Contusion of right front wall of thorax, initial encounter: Secondary | ICD-10-CM

## 2015-10-30 DIAGNOSIS — Z8673 Personal history of transient ischemic attack (TIA), and cerebral infarction without residual deficits: Secondary | ICD-10-CM

## 2015-10-30 DIAGNOSIS — T502X5A Adverse effect of carbonic-anhydrase inhibitors, benzothiadiazides and other diuretics, initial encounter: Secondary | ICD-10-CM | POA: Diagnosis present

## 2015-10-30 DIAGNOSIS — K5900 Constipation, unspecified: Secondary | ICD-10-CM | POA: Diagnosis present

## 2015-10-30 DIAGNOSIS — Z85828 Personal history of other malignant neoplasm of skin: Secondary | ICD-10-CM | POA: Diagnosis not present

## 2015-10-30 DIAGNOSIS — H409 Unspecified glaucoma: Secondary | ICD-10-CM | POA: Diagnosis present

## 2015-10-30 DIAGNOSIS — Z809 Family history of malignant neoplasm, unspecified: Secondary | ICD-10-CM | POA: Diagnosis not present

## 2015-10-30 DIAGNOSIS — Z87891 Personal history of nicotine dependence: Secondary | ICD-10-CM

## 2015-10-30 DIAGNOSIS — M25559 Pain in unspecified hip: Secondary | ICD-10-CM

## 2015-10-30 DIAGNOSIS — M1990 Unspecified osteoarthritis, unspecified site: Secondary | ICD-10-CM | POA: Diagnosis present

## 2015-10-30 HISTORY — DX: Fracture of one rib, unspecified side, initial encounter for closed fracture: S22.39XA

## 2015-10-30 LAB — BASIC METABOLIC PANEL
ANION GAP: 10 (ref 5–15)
BUN: 23 mg/dL — ABNORMAL HIGH (ref 6–20)
CALCIUM: 9.1 mg/dL (ref 8.9–10.3)
CHLORIDE: 89 mmol/L — AB (ref 101–111)
CO2: 22 mmol/L (ref 22–32)
CREATININE: 1.15 mg/dL (ref 0.61–1.24)
GFR calc non Af Amer: 57 mL/min — ABNORMAL LOW (ref >60)
GLUCOSE: 119 mg/dL — AB (ref 65–99)
Potassium: 2.6 mmol/L — CL (ref 3.5–5.1)
Sodium: 121 mmol/L — ABNORMAL LOW (ref 135–145)

## 2015-10-30 LAB — SODIUM, URINE, RANDOM: SODIUM UR: 65 mmol/L

## 2015-10-30 LAB — CBC WITH DIFFERENTIAL/PLATELET
BASOS PCT: 0 %
Basophils Absolute: 0 10*3/uL (ref 0.0–0.1)
Eosinophils Absolute: 0 10*3/uL (ref 0.0–0.7)
Eosinophils Relative: 0 %
HEMATOCRIT: 37.4 % — AB (ref 39.0–52.0)
HEMOGLOBIN: 13.6 g/dL (ref 13.0–17.0)
LYMPHS ABS: 1.2 10*3/uL (ref 0.7–4.0)
Lymphocytes Relative: 9 %
MCH: 33.3 pg (ref 26.0–34.0)
MCHC: 36.4 g/dL — AB (ref 30.0–36.0)
MCV: 91.7 fL (ref 78.0–100.0)
MONO ABS: 1.3 10*3/uL — AB (ref 0.1–1.0)
MONOS PCT: 10 %
NEUTROS ABS: 10.3 10*3/uL — AB (ref 1.7–7.7)
NEUTROS PCT: 81 %
Platelets: 198 10*3/uL (ref 150–400)
RBC: 4.08 MIL/uL — ABNORMAL LOW (ref 4.22–5.81)
RDW: 13.5 % (ref 11.5–15.5)
WBC: 12.9 10*3/uL — ABNORMAL HIGH (ref 4.0–10.5)

## 2015-10-30 LAB — MAGNESIUM
MAGNESIUM: 2.2 mg/dL (ref 1.7–2.4)
Magnesium: 2 mg/dL (ref 1.7–2.4)

## 2015-10-30 LAB — TROPONIN I
Troponin I: <0.03 ng/mL (ref <0.03)
Troponin I: 0.03 ng/mL (ref <0.03)

## 2015-10-30 LAB — URINALYSIS, ROUTINE W REFLEX MICROSCOPIC
Bilirubin Urine: NEGATIVE
GLUCOSE, UA: NEGATIVE mg/dL
Hgb urine dipstick: NEGATIVE
KETONES UR: NEGATIVE mg/dL
Leukocytes, UA: NEGATIVE
Nitrite: NEGATIVE
PH: 5.5 (ref 5.0–8.0)
PROTEIN: NEGATIVE mg/dL
SPECIFIC GRAVITY, URINE: 1.015 (ref 1.005–1.030)

## 2015-10-30 LAB — TSH: TSH: 2.718 u[IU]/mL (ref 0.350–4.500)

## 2015-10-30 MED ORDER — POTASSIUM CHLORIDE CRYS ER 20 MEQ PO TBCR
20.0000 meq | EXTENDED_RELEASE_TABLET | Freq: Once | ORAL | Status: AC
  Administered 2015-10-30: 20 meq via ORAL
  Filled 2015-10-30: qty 1

## 2015-10-30 MED ORDER — ACETAMINOPHEN 325 MG PO TABS
650.0000 mg | ORAL_TABLET | Freq: Four times a day (QID) | ORAL | Status: DC | PRN
  Administered 2015-10-30 – 2015-11-02 (×8): 650 mg via ORAL
  Filled 2015-10-30 (×8): qty 2

## 2015-10-30 MED ORDER — ATENOLOL 25 MG PO TABS: 100.0000 mg | ORAL_TABLET | Freq: Every day | ORAL | Status: DC

## 2015-10-30 MED ORDER — ENSURE ENLIVE PO LIQD
237.0000 mL | Freq: Two times a day (BID) | ORAL | Status: DC
  Administered 2015-10-31: 237 mL via ORAL

## 2015-10-30 MED ORDER — POTASSIUM CHLORIDE IN NACL 20-0.9 MEQ/L-% IV SOLN
INTRAVENOUS | Status: AC
  Administered 2015-10-31: 10:00:00 via INTRAVENOUS

## 2015-10-30 MED ORDER — SODIUM CHLORIDE 0.9% FLUSH
3.0000 mL | Freq: Two times a day (BID) | INTRAVENOUS | Status: DC
  Administered 2015-10-31 – 2015-11-06 (×9): 3 mL via INTRAVENOUS

## 2015-10-30 MED ORDER — MINERAL OIL RE ENEM
1.0000 | ENEMA | Freq: Every day | RECTAL | Status: DC | PRN
  Filled 2015-10-30: qty 1

## 2015-10-30 MED ORDER — POTASSIUM CHLORIDE 10 MEQ/100ML IV SOLN
INTRAVENOUS | Status: AC
  Filled 2015-10-30: qty 100

## 2015-10-30 MED ORDER — POTASSIUM CHLORIDE 10 MEQ/100ML IV SOLN
10.0000 meq | Freq: Once | INTRAVENOUS | Status: AC
  Administered 2015-10-30: 10 meq via INTRAVENOUS
  Filled 2015-10-30: qty 100

## 2015-10-30 MED ORDER — RIVAROXABAN 20 MG PO TABS
20.0000 mg | ORAL_TABLET | Freq: Every evening | ORAL | Status: DC
  Administered 2015-10-30 – 2015-11-05 (×7): 20 mg via ORAL
  Filled 2015-10-30 (×7): qty 1

## 2015-10-30 MED ORDER — ACETAMINOPHEN 650 MG RE SUPP: 650.0000 mg | Freq: Four times a day (QID) | RECTAL | Status: DC | PRN

## 2015-10-30 MED ORDER — POTASSIUM CHLORIDE CRYS ER 20 MEQ PO TBCR
20.0000 meq | EXTENDED_RELEASE_TABLET | Freq: Once | ORAL | Status: DC
  Filled 2015-10-30: qty 1

## 2015-10-30 MED ORDER — SODIUM CHLORIDE 0.9 % IV BOLUS (SEPSIS)
500.0000 mL | Freq: Once | INTRAVENOUS | Status: AC
Start: 2015-10-30 — End: 2015-10-30
  Administered 2015-10-30: 500 mL via INTRAVENOUS

## 2015-10-30 MED ORDER — POLYETHYLENE GLYCOL 3350 17 G PO PACK
17.0000 g | PACK | Freq: Every day | ORAL | Status: DC | PRN
  Administered 2015-10-30 – 2015-11-03 (×2): 17 g via ORAL
  Filled 2015-10-30 (×2): qty 1

## 2015-10-30 MED ORDER — POTASSIUM CHLORIDE 10 MEQ/100ML IV SOLN
10.0000 meq | Freq: Once | INTRAVENOUS | Status: AC
  Administered 2015-10-30: 10 meq via INTRAVENOUS

## 2015-10-30 MED ORDER — ATENOLOL 25 MG PO TABS
50.0000 mg | ORAL_TABLET | Freq: Every day | ORAL | Status: DC
  Administered 2015-10-31 – 2015-11-06 (×7): 50 mg via ORAL
  Filled 2015-10-30 (×7): qty 2

## 2015-10-30 MED ORDER — POLYVINYL ALCOHOL 1.4 % OP SOLN
2.0000 [drp] | Freq: Two times a day (BID) | OPHTHALMIC | Status: DC
  Administered 2015-10-30 – 2015-11-05 (×13): 2 [drp] via OPHTHALMIC
  Filled 2015-10-30: qty 15

## 2015-10-30 MED ORDER — ACETAMINOPHEN 500 MG PO TABS
1000.0000 mg | ORAL_TABLET | Freq: Once | ORAL | Status: AC
  Administered 2015-10-30: 1000 mg via ORAL
  Filled 2015-10-30: qty 2

## 2015-10-30 MED ORDER — DORZOLAMIDE HCL-TIMOLOL MAL 2-0.5 % OP SOLN
1.0000 [drp] | Freq: Every day | OPHTHALMIC | Status: DC
  Administered 2015-11-01 – 2015-11-06 (×6): 1 [drp] via OPHTHALMIC
  Filled 2015-10-30: qty 10

## 2015-10-30 MED ORDER — ASPIRIN EC 81 MG PO TBEC: 81.0000 mg | DELAYED_RELEASE_TABLET | Freq: Every day | ORAL | Status: DC

## 2015-10-30 MED ORDER — CLONIDINE HCL 0.2 MG PO TABS
0.2000 mg | ORAL_TABLET | Freq: Two times a day (BID) | ORAL | Status: DC
  Administered 2015-10-30 – 2015-11-01 (×3): 0.2 mg via ORAL
  Filled 2015-10-30 (×3): qty 1

## 2015-10-30 MED ORDER — DOCUSATE SODIUM 100 MG PO CAPS
100.0000 mg | ORAL_CAPSULE | Freq: Every day | ORAL | Status: DC
  Administered 2015-10-30 – 2015-11-06 (×8): 100 mg via ORAL
  Filled 2015-10-30 (×8): qty 1

## 2015-10-30 MED ORDER — DILTIAZEM HCL ER COATED BEADS 240 MG PO CP24
240.0000 mg | ORAL_CAPSULE | Freq: Every day | ORAL | Status: DC
  Administered 2015-10-31 – 2015-11-01 (×2): 240 mg via ORAL
  Filled 2015-10-30 (×2): qty 1

## 2015-10-30 NOTE — ED Provider Notes (Signed)
CSN: [id number]     Arrival date & time 10/30/15  1335 History   First MD Initiated Contact with Patient 10/30/15 1337     Chief Complaint  Patient presents with  . Hypotension     (Consider location/radiation/quality/duration/timing/severity/associated sxs/prior Treatment) HPI Comments: 82 year old male with history of atrial fibrillation, high blood pressure, anticoagulant use presents for bradycardia and rib pain. Patient had a recent mechanical fall without syncope and is had right rib pain worsening since. Patient was sent over from urgent care due to abnormal vitals.  Patient was placed on series all my partially 5 days ago by ophthalmology for glaucoma and was supposed to take it once a day and then if tolerated more frequently. Patient's been taking 4 times a day since then. Patient denies any urinary symptoms. Patient has very mild general confusion per patient's son. No head injuries recently however patient has had a subdural and is on blood thinners.  The history is provided by the patient and a relative.    Past Medical History  Diagnosis Date  . Chronic atrial fibrillation (HCC)     2D echo  09/23/2006 LA and right atrium severely dilated, moderate pulmonary hypertension  . Hypertension   . Glaucoma    Past Surgical History  Procedure Laterality Date  . Skin cancer removal     Family History  Problem Relation Age of Onset  . Cancer Mother   . Heart disease Father   . Emphysema Father   . Heart disease Brother    Social History  Substance Use Topics  . Smoking status: Former Research scientist (life sciences)  . Smokeless tobacco: None     Comment: quit smoking in his 30's  . Alcohol Use: Yes     Comment: two beers per day    Review of Systems  Constitutional: Positive for fatigue. Negative for fever and chills.  HENT: Negative for congestion.   Eyes: Negative for visual disturbance.  Respiratory: Negative for shortness of breath.   Cardiovascular: Negative for chest pain.   Gastrointestinal: Negative for vomiting and abdominal pain.  Genitourinary: Negative for dysuria and flank pain.  Musculoskeletal: Negative for back pain, neck pain and neck stiffness.  Skin: Negative for rash.  Neurological: Negative for light-headedness and headaches.  Psychiatric/Behavioral: Positive for confusion.      Allergies  Review of patient's allergies indicates no known allergies.  Home Medications   Prior to Admission medications   Medication Sig Start Date End Date Taking? Authorizing Provider  acetaZOLAMIDE (DIAMOX) 250 MG tablet Take 250 mg by mouth 4 (four) times daily as needed.   Yes Historical Provider, MD  aspirin EC 81 MG tablet Take 81 mg by mouth daily.   Yes Historical Provider, MD  atenolol (TENORMIN) 100 MG tablet TAKE ONE (1) TABLET EACH DAY 03/23/15  Yes Pixie Casino, MD  B Complex-C (B-COMPLEX WITH VITAMIN C) tablet Take 1 tablet by mouth daily.   Yes Historical Provider, MD  cloNIDine (CATAPRES) 0.2 MG tablet TAKE ONE TABLET BY MOUTH TWICE A DAY 05/15/15  Yes Pixie Casino, MD  diltiazem (CARDIZEM CD) 240 MG 24 hr capsule TAKE ONE (1) CAPSULE EACH DAY 04/07/15  Yes Pixie Casino, MD  dorzolamide-timolol (COSOPT) 22.3-6.8 MG/ML ophthalmic solution 1 drop daily.   Yes Historical Provider, MD  lisinopril-hydrochlorothiazide (PRINZIDE,ZESTORETIC) 20-25 MG tablet TAKE ONE (1) TABLET EACH DAY 03/16/15  Yes Pixie Casino, MD  Multiple Vitamin (MULTIVITAMIN WITH MINERALS) TABS tablet Take 1 tablet by mouth daily.   Yes  Historical Provider, MD  Multiple Vitamins-Minerals (VISION FORMULA EYE HEALTH) CAPS Take 1 capsule by mouth daily.   Yes Historical Provider, MD  Polyethyl Glycol-Propyl Glycol (SYSTANE) 0.4-0.3 % SOLN Apply 2 drops to eye 2 (two) times daily.   Yes Historical Provider, MD  XARELTO 20 MG TABS tablet TAKE ONE (1) TABLET EACH DAY 04/13/15  Yes Pixie Casino, MD   BP 103/89 mmHg  Pulse 71  Temp(Src) 98.6 F (37 C) (Oral)  Resp 14  SpO2  98% Physical Exam  Constitutional: He is oriented to person, place, and time. He appears well-developed and well-nourished.  HENT:  Head: Normocephalic and atraumatic.  Dry mm  Eyes: Right eye exhibits no discharge. Left eye exhibits no discharge.  Neck: Normal range of motion. Neck supple. No tracheal deviation present.  Cardiovascular: Normal rate and regular rhythm.   Pulmonary/Chest: Effort normal and breath sounds normal.  Abdominal: Soft. He exhibits no distension. There is no tenderness. There is no guarding.  Musculoskeletal: He exhibits tenderness (right lateral lower rib). He exhibits no edema.  Neurological: He is alert and oriented to person, place, and time.  5+ strength in UE and LE with f/e at major joints. Sensation to palpation intact in UE and LE. CNs 2-12 grossly intact.  EOMFI.  PERRL.   Finger nose and coordination intact bilateral.   Visual fields intact to finger testing. No nystagmus Fatigue appearance   Skin: Skin is warm. No rash noted.  Psychiatric:  Mild difficulty hearing, intermittent confusion  Nursing note and vitals reviewed.   ED Course  Procedures (including critical care time) Labs Review Labs Reviewed  BASIC METABOLIC PANEL - Abnormal; Notable for the following:    Sodium 121 (*)    Potassium 2.6 (*)    Chloride 89 (*)    Glucose, Bld 119 (*)    BUN 23 (*)    GFR calc non Af Amer 57 (*)    All other components within normal limits  CBC WITH DIFFERENTIAL/PLATELET - Abnormal; Notable for the following:    WBC 12.9 (*)    RBC 4.08 (*)    HCT 37.4 (*)    MCHC 36.4 (*)    Neutro Abs 10.3 (*)    Monocytes Absolute 1.3 (*)    All other components within normal limits  TROPONIN I  MAGNESIUM  URINALYSIS, ROUTINE W REFLEX MICROSCOPIC (NOT AT [facility])    Imaging Review No results found. I have personally reviewed and evaluated these images and lab results as part of my medical decision-making.   EKG Interpretation   Date/Time:  Monday  October 30 2015 13:48:33 EDT Ventricular Rate:  58 PR Interval:    QRS Duration: 99 QT Interval:  437 QTC Calculation: 430 R Axis:   -13 Text Interpretation:  Atrial fibrillation Left ventricular hypertrophy  Borderline T abnormalities, inferior leads Confirmed by Merlina Marchena MD, Zarian Colpitts  [phone]) on 10/30/2015 3:04:49 PM      MDM   Final diagnoses:  Symptomatic bradycardia  Hypokalemia  General weakness  Rib contusion, right, initial encounter  Hyponatremia   Patient presents for episode of bradycardia and borderline blood pressure. Patient's vitals improved in the ER. Patient has general weakness on exam plan for blood work, urinalysis. No witnessed falls with subdural history and anticoagulant use plan for CT of the head. Patient has been taking Extra sutures all my. IV potassium given. Hyponatremia. EKG similar to previous. Plan for telemetry admission  The patients results and plan were reviewed and discussed.  Any x-rays performed were independently reviewed by myself.   Differential diagnosis were considered with the presenting HPI.  Medications  potassium chloride 10 mEq in 100 mL IVPB (not administered)  potassium chloride 10 mEq in 100 mL IVPB (10 mEq Intravenous New Bag/Given 10/30/15 1510)  sodium chloride 0.9 % bolus 500 mL (500 mLs Intravenous New Bag/Given 10/30/15 1406)  acetaminophen (TYLENOL) tablet 1,000 mg (1,000 mg Oral Given 10/30/15 1544)    Filed Vitals:   10/30/15 1352  BP: 103/89  Pulse: 71  Temp: 98.6 F (37 C)  TempSrc: Oral  Resp: 14  SpO2: 98%    Final diagnoses:  Symptomatic bradycardia  Hypokalemia  General weakness  Rib contusion, right, initial encounter  Hyponatremia      Elnora Morrison, MD 10/30/15 1559

## 2015-10-30 NOTE — H&P (Signed)
TRH H&P   Patient Demographics:    Zachary Arellano, is a 82 y.o. male  MRN: [id number]   DOB - 1931/05/26  Admit Date - 10/30/2015  Outpatient Primary MD for the patient is [name], MD  Referring MD/NP/PA: Charolotte Capuchin  Outpatient Specialists: Clent Jacks (opthamologist), Rankin (opthamologist), Mali Hilty (cardiologist)  Patient coming from: urgent care, Dr. Steva Colder  Chief Complaint  Patient presents with  . Hypotension      HPI:    Zachary Arellano  is a 82 y.o. male, with Pafib (chads2 vasc=3  ) , Hypertension,  Glaucoma, apparently started on diamox, on this past Thursday.  Pt was taking 4 pills the first day.  Apparently on Friday seemed slightly incoherent., and his voice was raspy according to his son.  Seemed slightly delusional?.  Pt was taken to urgent care today and sent to ED for evaluation of bradycardia hr 44, and hypotension, and generalized weakness.   In ED , found to be severely hypokalemic, and hyponatremic. K=2.6 and Na=121,  Pt will be admitted for severe hyponatremia.      Review of systems:    In addition to the HPI above,  No Fever-chills, No Headache, No changes with Vision or hearing, No problems swallowing food or Liquids, No Chest pain, Cough or Shortness of Breath, No Abdominal pain, No Nausea or Vommitting, Bowel movements are regular, No Blood in stool or Urine, No dysuria, No new skin rashes or bruises, No new joints pains-aches,  No new weakness, tingling, numbness in any extremity, No recent weight gain or loss, No polyuria, polydypsia or polyphagia, No significant Mental Stressors.  A full 10 point Review of Systems was done, except as stated above, all other Review of Systems were negative.   With Past History of the following :    Past Medical History  Diagnosis Date  . Chronic atrial fibrillation (HCC)     2D echo  09/23/2006  LA and right atrium severely dilated, moderate pulmonary hypertension  . Hypertension   . Glaucoma   . Stroke (cerebrum) ([place])     on CT scan  . Rib fracture       Past Surgical History  Procedure Laterality Date  . Skin cancer removal    . Cataract extraction        Social History:     Social History  Substance Use Topics  . Smoking status: Former Research scientist (life sciences)  . Smokeless tobacco: Not on file     Comment: quit smoking in his 25's  . Alcohol Use: 6.0 oz/week    10 Standard drinks or equivalent per week     Comment: two beers per day     Lives - at home.   Mobility -      Family History :     Family History  Problem Relation Age of Onset  . Cancer Mother   .  Heart disease Father   . Emphysema Father   . Heart disease Brother      Home Medications:   Prior to Admission medications   Medication Sig Start Date End Date Taking? Authorizing Provider  acetaZOLAMIDE (DIAMOX) 250 MG tablet Take 250 mg by mouth 4 (four) times daily as needed.   Yes Historical Provider, MD  aspirin EC 81 MG tablet Take 81 mg by mouth daily.   Yes Historical Provider, MD  atenolol (TENORMIN) 100 MG tablet TAKE ONE (1) TABLET EACH DAY 03/23/15  Yes Pixie Casino, MD  B Complex-C (B-COMPLEX WITH VITAMIN C) tablet Take 1 tablet by mouth daily.   Yes Historical Provider, MD  cloNIDine (CATAPRES) 0.2 MG tablet TAKE ONE TABLET BY MOUTH TWICE A DAY 05/15/15  Yes Pixie Casino, MD  diltiazem (CARDIZEM CD) 240 MG 24 hr capsule TAKE ONE (1) CAPSULE EACH DAY 04/07/15  Yes Pixie Casino, MD  dorzolamide-timolol (COSOPT) 22.3-6.8 MG/ML ophthalmic solution 1 drop daily.   Yes Historical Provider, MD  lisinopril-hydrochlorothiazide (PRINZIDE,ZESTORETIC) 20-25 MG tablet TAKE ONE (1) TABLET EACH DAY 03/16/15  Yes Pixie Casino, MD  Multiple Vitamin (MULTIVITAMIN WITH MINERALS) TABS tablet Take 1 tablet by mouth daily.   Yes Historical Provider, MD  Multiple Vitamins-Minerals (VISION FORMULA EYE HEALTH)  CAPS Take 1 capsule by mouth daily.   Yes Historical Provider, MD  Polyethyl Glycol-Propyl Glycol (SYSTANE) 0.4-0.3 % SOLN Apply 2 drops to eye 2 (two) times daily.   Yes Historical Provider, MD  XARELTO 20 MG TABS tablet TAKE ONE (1) TABLET EACH DAY 04/13/15  Yes Pixie Casino, MD     Allergies:    No Known Allergies   Physical Exam:   Vitals  Blood pressure 142/88, pulse 80, temperature 98.6 F (37 C), temperature source Oral, resp. rate 16, height 5\' 10"  (1.778 m), weight 69.4 kg (153 lb), SpO2 100 %.   1. General  lying in bed in NAD,    2. Normal affect and insight, Not Suicidal or Homicidal, Awake Alert, Oriented X 3.  3. No F.N deficits, ALL C.Nerves Intact, Strength 5/5 all 4 extremities, Sensation intact all 4 extremities, Plantars down going.  4. Ears and Eyes appear Normal, Conjunctivae clear, PERRLA. Moist Oral Mucosa.  5. Supple Neck, No JVD, No cervical lymphadenopathy appriciated, No Carotid Bruits.  6. Symmetrical Chest wall movement, Good air movement bilaterally, CTAB.  7. RRR, No Gallops, Rubs or Murmurs, No Parasternal Heave.  8. Positive Bowel Sounds, Abdomen Soft, No tenderness, No organomegaly appriciated,No rebound -guarding or rigidity.  9.  No Cyanosis, Normal Skin Turgor, No Skin Rash or Bruise.  10. Good muscle tone,  joints appear normal , no effusions, Normal ROM.  11. No Palpable Lymph Nodes in Neck or Axillae     Data Review:    CBC  Recent Labs Lab 10/30/15 1405  WBC 12.9*  HGB 13.6  HCT 37.4*  PLT 198  MCV 91.7  MCH 33.3  MCHC 36.4*  RDW 13.5  LYMPHSABS 1.2  MONOABS 1.3*  EOSABS 0.0  BASOSABS 0.0   ------------------------------------------------------------------------------------------------------------------  Chemistries   Recent Labs Lab 10/30/15 1405  NA 121*  K 2.6*  CL 89*  CO2 22  GLUCOSE 119*  BUN 23*  CREATININE 1.15  CALCIUM 9.1  MG 2.2    ------------------------------------------------------------------------------------------------------------------ estimated creatinine clearance is 48.6 mL/min (by C-G formula based on Cr of 1.15). ------------------------------------------------------------------------------------------------------------------  Recent Labs  10/30/15 1405  TSH 2.718    Coagulation profile  No results for input(s): INR, PROTIME in the last 168 hours. ------------------------------------------------------------------------------------------------------------------- No results for input(s): DDIMER in the last 72 hours. -------------------------------------------------------------------------------------------------------------------  Cardiac Enzymes  Recent Labs Lab 10/30/15 1405  TROPONINI <0.03   ------------------------------------------------------------------------------------------------------------------ No results found for: BNP   ---------------------------------------------------------------------------------------------------------------  Urinalysis    Component Value Date/Time   COLORURINE YELLOW 10/30/2015 1552   APPEARANCEUR CLEAR 10/30/2015 1552   LABSPEC 1.015 10/30/2015 1552   PHURINE 5.5 10/30/2015 1552   GLUCOSEU NEGATIVE 10/30/2015 1552   HGBUR NEGATIVE 10/30/2015 1552   BILIRUBINUR NEGATIVE 10/30/2015 1552   KETONESUR NEGATIVE 10/30/2015 1552   PROTEINUR NEGATIVE 10/30/2015 1552   NITRITE NEGATIVE 10/30/2015 1552   LEUKOCYTESUR NEGATIVE 10/30/2015 1552    ----------------------------------------------------------------------------------------------------------------   Imaging Results:    Dg Ribs Unilateral W/chest Right  10/30/2015  CLINICAL DATA:  Status post fall yesterday with a right chest injury. Pain. EXAM: RIGHT RIBS AND CHEST - 3+ VIEW COMPARISON:  PA and lateral chest 10/16/2015. Single view of the chest 09/08/2006. FINDINGS: The lungs are clear. No  pneumothorax or pleural effusion. Heart size is upper normal. Aortic atherosclerosis is noted. Remote healed right sixth and seventh rib fractures are unchanged. No acute fracture is identified. IMPRESSION: No acute abnormality. Remote healed right sixth and seventh rib fractures. Atherosclerosis. Electronically Signed   By: Inge Rise M.D.   On: 10/30/2015 16:35   Ct Head Wo Contrast  10/30/2015  CLINICAL DATA:  Confusion EXAM: CT HEAD WITHOUT CONTRAST TECHNIQUE: Contiguous axial images were obtained from the base of the skull through the vertex without intravenous contrast. COMPARISON:  10/16/2015 FINDINGS: No skull fracture is noted. Paranasal sinuses and mastoid air cells are unremarkable. No intracranial hemorrhage, mass effect or midline shift. Stable atrophy.  Ventricular size is stable from prior exam. No acute cortical infarction. No mass lesion is noted on this unenhanced scan. 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 IMPRESSION: No acute intracranial abnormality. No definite acute cortical infarction. Stable cerebral atrophy. Again noted old infarct in left cerebellum. Electronically Signed   By: Lahoma Crocker M.D.   On: 10/30/2015 16:42    My personal review of EKG: Rhythm Afib at 58, nl axis,  no Acute ST changes   Assessment & Plan:    Active Problems:   Atrial fibrillation (HCC)   Essential hypertension   Hyponatremia   Hypokalemia   Symptomatic bradycardia    1.  Hyponatremia Check urine sodium, urine osm, check serum osm, tsh, cortisol Hydrate gently with ns iv Stop lisinopril/hydrochlorothiazide, stop acetazolamide  2. Hypokalemia Check magnesium Check cmp in am replete potassium  3. Bradycardia (hr 40's at urgent care) Decrease atenolol to 50mg  po qday Continue cardizem , timolol, for now.  Tele Trop i q6h x3 Cardiology consultation, appreciate input.   4.  Pafib (Chads2 vasc =3) Cont cardizem Cont xarelto  5. Constipation Colace, miralax prn Enema  prn  6. Gait instability PT to evaluate and tx  DVT Prophylaxis on xarelto,   AM Labs Ordered, also please review Full Orders  Family Communication: Admission, patients condition and plan of care including tests being ordered have been discussed with the patient who indicate understanding and agree with the plan and Code Status.  Code Status FULL CODE  Likely DC to    Condition GUARDED    Consults called: cardiology consult  Admission status: inpatient  Time spent in minutes : 45 minutes   Jani Gravel M.D on 10/30/2015 at 8:37 PM  Between 7am to 7pm - Pager -  [phone]. After 7pm go to [email or web address] - password [facility]  [facility] - Office  [phone]

## 2015-10-30 NOTE — ED Notes (Signed)
Pt states he went to an UC today for evaluation of rib pain related to a fall. Pt was sent here for evaluation of hypotension and A fib. Pt denies chest or being SOB at present

## 2015-10-30 NOTE — ED Notes (Signed)
Dr Reather Converse notified of critical Potassium

## 2015-10-31 ENCOUNTER — Encounter (HOSPITAL_COMMUNITY): Payer: Self-pay | Admitting: Adult Health

## 2015-10-31 ENCOUNTER — Inpatient Hospital Stay (HOSPITAL_COMMUNITY): Payer: Medicare Other

## 2015-10-31 DIAGNOSIS — S20219A Contusion of unspecified front wall of thorax, initial encounter: Secondary | ICD-10-CM | POA: Insufficient documentation

## 2015-10-31 DIAGNOSIS — I482 Chronic atrial fibrillation: Secondary | ICD-10-CM

## 2015-10-31 DIAGNOSIS — E871 Hypo-osmolality and hyponatremia: Principal | ICD-10-CM

## 2015-10-31 DIAGNOSIS — S20211A Contusion of right front wall of thorax, initial encounter: Secondary | ICD-10-CM

## 2015-10-31 DIAGNOSIS — E876 Hypokalemia: Secondary | ICD-10-CM

## 2015-10-31 DIAGNOSIS — R531 Weakness: Secondary | ICD-10-CM

## 2015-10-31 DIAGNOSIS — I10 Essential (primary) hypertension: Secondary | ICD-10-CM

## 2015-10-31 LAB — COMPREHENSIVE METABOLIC PANEL
ALT: 14 U/L — AB (ref 17–63)
AST: 18 U/L (ref 15–41)
Albumin: 3.6 g/dL (ref 3.5–5.0)
Alkaline Phosphatase: 36 U/L — ABNORMAL LOW (ref 38–126)
Anion gap: 6 (ref 5–15)
BUN: 18 mg/dL (ref 6–20)
CHLORIDE: 92 mmol/L — AB (ref 101–111)
CO2: 22 mmol/L (ref 22–32)
CREATININE: 0.79 mg/dL (ref 0.61–1.24)
Calcium: 7.9 mg/dL — ABNORMAL LOW (ref 8.9–10.3)
GFR calc Af Amer: >60 mL/min (ref >60)
GFR calc non Af Amer: >60 mL/min (ref >60)
Glucose, Bld: 102 mg/dL — ABNORMAL HIGH (ref 65–99)
Potassium: 2.5 mmol/L — CL (ref 3.5–5.1)
SODIUM: 120 mmol/L — AB (ref 135–145)
Total Bilirubin: 1.6 mg/dL — ABNORMAL HIGH (ref 0.3–1.2)
Total Protein: 5.5 g/dL — ABNORMAL LOW (ref 6.5–8.1)

## 2015-10-31 LAB — TROPONIN I: Troponin I: <0.03 ng/mL (ref <0.03)

## 2015-10-31 LAB — BASIC METABOLIC PANEL
Anion gap: 6 (ref 5–15)
BUN: 19 mg/dL (ref 6–20)
CHLORIDE: 93 mmol/L — AB (ref 101–111)
CO2: 23 mmol/L (ref 22–32)
CREATININE: 1.02 mg/dL (ref 0.61–1.24)
Calcium: 8.3 mg/dL — ABNORMAL LOW (ref 8.9–10.3)
GFR calc non Af Amer: >60 mL/min (ref >60)
Glucose, Bld: 110 mg/dL — ABNORMAL HIGH (ref 65–99)
Potassium: 4.2 mmol/L (ref 3.5–5.1)
SODIUM: 122 mmol/L — AB (ref 135–145)

## 2015-10-31 LAB — CBC
HCT: 33.3 % — ABNORMAL LOW (ref 39.0–52.0)
HEMOGLOBIN: 12.4 g/dL — AB (ref 13.0–17.0)
MCH: 34 pg (ref 26.0–34.0)
MCHC: 37.2 g/dL — AB (ref 30.0–36.0)
MCV: 91.2 fL (ref 78.0–100.0)
Platelets: 180 10*3/uL (ref 150–400)
RBC: 3.65 MIL/uL — AB (ref 4.22–5.81)
RDW: 13.2 % (ref 11.5–15.5)
WBC: 6.9 10*3/uL (ref 4.0–10.5)

## 2015-10-31 LAB — OSMOLALITY: Osmolality: 261 mOsm/kg — ABNORMAL LOW (ref 275–295)

## 2015-10-31 LAB — OSMOLALITY, URINE: Osmolality, Ur: 486 mOsm/kg (ref 300–900)

## 2015-10-31 MED ORDER — POTASSIUM CHLORIDE CRYS ER 20 MEQ PO TBCR
40.0000 meq | EXTENDED_RELEASE_TABLET | ORAL | Status: AC
  Administered 2015-10-31 (×2): 40 meq via ORAL
  Filled 2015-10-31 (×2): qty 2

## 2015-10-31 MED ORDER — SODIUM CHLORIDE 0.9 % IV SOLN
INTRAVENOUS | Status: DC
  Administered 2015-10-31 – 2015-11-01 (×3): via INTRAVENOUS

## 2015-10-31 MED ORDER — POTASSIUM CHLORIDE CRYS ER 20 MEQ PO TBCR
40.0000 meq | EXTENDED_RELEASE_TABLET | ORAL | Status: AC
  Administered 2015-10-31: 40 meq via ORAL
  Filled 2015-10-31: qty 2

## 2015-10-31 MED ORDER — POTASSIUM CHLORIDE 10 MEQ/100ML IV SOLN
10.0000 meq | INTRAVENOUS | Status: AC
  Administered 2015-10-31 (×4): 10 meq via INTRAVENOUS
  Filled 2015-10-31 (×4): qty 100

## 2015-10-31 NOTE — Care Management Important Message (Signed)
Important Message  Patient Details  Name: Zachary Arellano MRN: [id number] Date of Birth: 13-Feb-1932   Medicare Important Message Given:  Yes    Briant Sites, RN 10/31/2015, 12:45 PM

## 2015-10-31 NOTE — Consult Note (Signed)
CARDIOLOGY CONSULT NOTE   Patient ID: Zachary Arellano MRN: [id number] DOB/AGE: 1931-08-22 82 y.o.  Admit Date: 10/30/2015 Referring Physician: Gwynneth Albright MD Primary Physician: [name], MD Consulting Cardiologist: [name] MD Primary Cardiologist: Dr. Lyman Bishop Reason for Consultation: Bradycardia and atrial fib  Clinical Summary [name] is an 82 y.o.male with known history of atrial fibrillation on Xareleto, CHADS VASc Score of 4, hypertension, CVA who was last seen by cardiology in 01/2015 and was doing well from CV standpoint. He presented to ER after being seen by urgent care due to generalized weakness, hypotension,and mild confusion. He apparently fell the day before.   The patient reports that he has been followed by an ophthalmologist who is treating intraocular pressure on the left which is been increasing. He was placed on Diamox 250 mg twice a day and if tolerated to increase it to every 6 hours. He began this approximately 4 days ago, and began taking 4 tablets a day right away. He continued this for 4 days. On Sunday, the patient fell and injured his right lateral chest bruising some ribs. He stopped taking the Diamox that day. It was the following day that he reported to urgent care.  On arrival to ER, BP 103/89, HR 71 afebrile. He was found to be significantly hypokalemic, K+ 2.5, hyponatremic Na+ 121. Creatinine 1.15. He was also found to have leukocytosis, WBC at 12.9. 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 CXR cardiomegaly with out disease.  EKG demonstrated atrial fib with slow ventricular response, rate of 51 bpm.  He was treated with NS, potassium replacement, and tylenol.   He is feeling much better, and is more cognizant now. The patient continues to have some soreness on the lower right lateral chest. Heart rate has improved.  No Known Allergies  Medications Scheduled Medications: .  atenolol  50 mg Oral Daily  . cloNIDine  0.2 mg Oral BID  . diltiazem  240 mg Oral Daily  . docusate sodium  100 mg Oral Daily  . dorzolamide-timolol  1 drop Both Eyes Daily  . feeding supplement (ENSURE ENLIVE)  237 mL Oral BID BM  . polyvinyl alcohol  2 drop Both Eyes BID  . potassium chloride  20 mEq Oral Once  . rivaroxaban  20 mg Oral QPM  . sodium chloride flush  3 mL Intravenous Q12H    Infusions: . 0.9 % NaCl with KCl 20 mEq / L 75 mL/hr at 10/31/15 0953    PRN Medications: acetaminophen **OR** acetaminophen, mineral oil, polyethylene glycol   Past Medical History  Diagnosis Date  . Chronic atrial fibrillation (HCC)     2D echo  09/23/2006 LA and right atrium severely dilated, moderate pulmonary hypertension  . Hypertension   . Glaucoma   . Stroke (cerebrum) (HCC)     Left cerebellum.  . Rib fracture     Past Surgical History  Procedure Laterality Date  . Skin cancer removal    . Cataract extraction      Family History  Problem Relation Age of Onset  . Cancer Mother   . Heart disease Father   . Emphysema Father   . Heart disease Brother     Social History [name] reports that he has quit smoking. He does not have any smokeless tobacco history on file. [name] reports that he drinks about 6.0 oz of alcohol per week.  Review of Systems Complete  review of systems are found to be negative unless outlined in H&P above.  Physical Examination Blood pressure 160/88, pulse 80, temperature 98.1 F (36.7 C), temperature source Oral, resp. rate 18, height 5\' 10"  (1.778 m), weight 153 lb (69.4 kg), SpO2 100 %.  Intake/Output Summary (Last 24 hours) at 10/31/15 1112 Last data filed at 10/31/15 0332  Gross per 24 hour  Intake    240 ml  Output    900 ml  Net   -660 ml    Telemetry:Atrial fibrillation with episodes of rapid heart rate 120 bpm.  GEN: No acute distress currently soreness on the right lower lateral chest HEENT: Conjunctiva and lids  normal, oropharynx clear with moist mucosa. Neck: Supple, no elevated JVP or carotid bruits, no thyromegaly. Lungs: Clear to auscultation, nonlabored breathing at rest. Cardiac: Iregular rate and rhythm, no S3 or significant systolic murmur, no pericardial rub. Abdomen: Soft, nontender, no hepatomegaly, bowel sounds present, no guarding or rebound. Extremities: No pitting edema, distal pulses 2+. Skin: Warm and dry. Musculoskeletal: No kyphosis. Neuropsychiatric: Alert and oriented x3, affect grossly appropriate.  Prior Cardiac Testing/Procedures 1. Echocardiogram 08/23/2006 (transcribed from scanned document). 1. Left ventricle is normal in size. Left ventricular systolic function is normal.  2. The right ventricle is normal size. There is moderate pulmonary hypertension 3. Severe biatrial enlargement. 4. There is mild mitral regurgitation. There is mild tricuspid regurgitation. Mild aortic regurgitation.  2. Stress MPI 09/24/2006 (tr is transcribed from scanned document)  Low risk study without evidence of reversible ischemia.  Non-gated study due to atrial fib.  No acute ST-T wave changes.   Lab   Basic Metabolic Panel:  Recent Labs Lab 10/30/15 1405 10/30/15 1939 10/31/15 0202  NA 121*  --  120*  K 2.6*  --  2.5*  CL 89*  --  92*  CO2 22  --  22  GLUCOSE 119*  --  102*  BUN 23*  --  18  CREATININE 1.15  --  0.79  CALCIUM 9.1  --  7.9*  MG 2.2 2.0  --     Liver Function Tests:  Recent Labs Lab 10/31/15 0202  AST 18  ALT 14*  ALKPHOS 36*  BILITOT 1.6*  PROT 5.5*  ALBUMIN 3.6    CBC:  Recent Labs Lab 10/30/15 1405 10/31/15 0202  WBC 12.9* 6.9  NEUTROABS 10.3*  --   HGB 13.6 12.4*  HCT 37.4* 33.3*  MCV 91.7 91.2  PLT 198 180    Cardiac Enzymes:  Recent Labs Lab 10/30/15 1405 10/30/15 1939 10/31/15 0202 10/31/15 0525  TROPONINI <0.03 0.03* <0.03 <0.03    Radiology: Dg Ribs Unilateral W/chest Right  10/30/2015  CLINICAL DATA:  Status post  fall yesterday with a right chest injury. Pain. EXAM: RIGHT RIBS AND CHEST - 3+ VIEW COMPARISON:  PA and lateral chest 10/16/2015. Single view of the chest 09/08/2006. FINDINGS: The lungs are clear. No pneumothorax or pleural effusion. Heart size is upper normal. Aortic atherosclerosis is noted. Remote healed right sixth and seventh rib fractures are unchanged. No acute fracture is identified. IMPRESSION: No acute abnormality. Remote healed right sixth and seventh rib fractures. Atherosclerosis. Electronically Signed   By: Inge Rise M.D.   On: 10/30/2015 16:35   Ct Head Wo Contrast  10/30/2015  CLINICAL DATA:  Confusion EXAM: CT HEAD WITHOUT CONTRAST TECHNIQUE: Contiguous axial images were obtained from the base of the skull through the vertex without intravenous contrast. COMPARISON:  10/16/2015 FINDINGS: No skull fracture is  noted. Paranasal sinuses and mastoid air cells are unremarkable. No intracranial hemorrhage, mass effect or midline shift. Stable atrophy.  Ventricular size is stable from prior exam. No acute cortical infarction. No mass lesion is noted on this unenhanced scan. 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 IMPRESSION: No acute intracranial abnormality. No definite acute cortical infarction. Stable cerebral atrophy. Again noted old infarct in left cerebellum. Electronically Signed   By: Lahoma Crocker M.D.   On: 10/30/2015 16:42     ECG: Atrial fibrillation with LVH and nonspecific T-wave changes.  Impression and Recommendations  1. Chronic atrial fibrillation: Due to hypotension and bradycardia he was not started back on usual doses of diltiazem and atenolol last evening, but is now restarted on atenolol 50 mg daily, (home dose 100 mg daily) and has been restarted on diltiazem 240 mg daily this a.m. He is also been continued on clonidine 0.2 mg twice a day blood pressure has improved and heart rate has been labile. There were periods in the early morning hours with heart rate up  to 120 bpm. He continues on Xarelto 20 mg daily.  2. Dehydration: Likely related to Diamox which she was taking 250 mg every 6 hours for 4 days. This lead to hyponatremia and hypokalemia he is now receiving replacement.  3. Hypertension: Now back on diltiazem, atenolol, and clonidine. Watch closely as he continues to have evidence of dehydration on assessment. May need to decrease clonidine to 0.1 mg twice a day temporarily. Blood pressures are elevated however on most recent documentation.  4. History of left cerebellar CVA.: No neuro deficits.  Signed: Phill Myron. Lawrence NP [place]  10/31/2015, 11:12 AM Co-Sign MD  Attending note:  Patient seen and examined. Reviewed records and modified above note by Ms. Lawrence NP. Mr. Cutrer presents to the hospital with dehydration and electrolyte abnormalities as outlined in the setting of recent use of Diamox. He is currently undergoing electrolyte repletion as well as hydration. Due to relatively low blood pressure and heart rate, initial use of diltiazem CD and atenolol was interrupted. He had had a fall recently, his son states that sometimes he seems to be lightheaded when he stands up quickly. On examination he appears comfortable this morning, states that he feels better, is more mentally alert. Systolic blood pressures ranging from 100-160, he is in atrial fibrillation by telemetry. Intermittent RVR noted in the absence of heart rate control medications. Lungs are clear without breathing. Cardiac exam reveals irregularly irregular rhythm without gallop. Lab work reviewed finding sodium 120, potassium 2.5, creatinine 0.8, troponin I levels negative, hemoglobin 12.4. ECG shows atrial fibrillation with LVH and nonspecific T-wave changes. Patient has history of chronic atrial fibrillation with CHADSVASC score of 4. He has been on Xarelto as an outpatient. At this point the patient has been put back on diltiazem CD 240 mg daily with reduced dose atenolol 50  mg daily. Would follow heart rate and blood pressure control to see if this is adequate. Also consider checking orthostatics. Continue electrolyte correction per primary team. Discussed with patient, wife, and son.  Satira Sark, M.D., F.A.C.C.

## 2015-10-31 NOTE — Progress Notes (Signed)
PROGRESS NOTE    Zachary Arellano  [id number] DOB: [birth year] DOA: 10/30/2015 PCP: [name], MD  Outpatient Specialists:     Brief Narrative:  89 yom with a hx of afib (CHADsVASc=3), HTN, and glaucoma presented with complaints of hypotension and generalized weakness. While in the ED he was noted to be hypokalemic and hyponatremic. Pt was admitted for severe hyponatremia.   Assessment & Plan:   Active Problems:   Atrial fibrillation (HCC)   Essential hypertension   Hyponatremia   Hypokalemia   Symptomatic bradycardia   1. Hyponatremia. Likely secondary to diuretics and dehydration. Continue to hold lisinopril/HTCZ and acetazolamide. Started on IVF. 2. Hypokalemia. Replace potassium. Magnesium normal range.  3. Bradycardia possibly related to electrolyte disturbances. Restarted on lower dose Cardizem and atenolol Cardiology following. 4. Afib CHADsVasc =3 . Anticoagulated on xarelto.HR is currently controlled.  5. Constipation. Started on miralax prn.  6. Gait instability. Pt had a fall on 7/15 he is complaining of pain in right hip will follow up Xrays. PT consult requested   7. Essential HTN. Stable.    DVT prophylaxis: Xarelto  Code Status: Full Family Communication: Family bedside  Disposition Plan: Discharge home once improved    Consultants:   Cardiology   Procedures:   None                   Antimicrobials:   None    Subjective: Doing well. Denies chest pain and SOB. Family bedside reports the pt still has significant rib pain from previous fracture.    Objective: Filed Vitals:   10/30/15 1700 10/30/15 1747 10/30/15 2253 10/31/15 0610  BP: 127/64 142/88 107/59 160/88  Pulse:  80 39 80  Temp:  98.6 F (37 C) 97.8 F (36.6 C) 98.1 F (36.7 C)  TempSrc:  Oral Oral Oral  Resp: 16  17 18   Height:  5\' 10"  (1.778 m)    Weight:  69.4 kg (153 lb)    SpO2:  100% 98% 100%    Intake/Output Summary (Last 24 hours) at 10/31/15 1122 Last data filed at  10/31/15 0332  Gross per 24 hour  Intake    240 ml  Output    900 ml  Net   -660 ml   Filed Weights   10/30/15 1747  Weight: 69.4 kg (153 lb)    Examination:  General exam: Appears calm and comfortable  Respiratory system: Clear to auscultation. Respiratory effort normal. Cardiovascular system: S1 & S2 heard, RRR. No JVD, murmurs, rubs, gallops or clicks. No pedal edema. Gastrointestinal system: Abdomen is nondistended, soft and nontender. No organomegaly or masses felt. Normal bowel sounds heard. Central nervous system: Alert and oriented. No focal neurological deficits. Extremities:Tenderness to palpation over superior aspect of right hip  Skin: No rashes, lesions or ulcers Psychiatry: Judgement and insight appear normal. Mood & affect appropriate.     Data Reviewed: I have personally reviewed following labs and imaging studies  CBC:  Recent Labs Lab 10/30/15 1405 10/31/15 0202  WBC 12.9* 6.9  NEUTROABS 10.3*  --   HGB 13.6 12.4*  HCT 37.4* 33.3*  MCV 91.7 91.2  PLT 198 [id number]   Basic Metabolic Panel:  Recent Labs Lab 10/30/15 1405 10/30/15 1939 10/31/15 0202  NA 121*  --  120*  K 2.6*  --  2.5*  CL 89*  --  92*  CO2 22  --  22  GLUCOSE 119*  --  102*  BUN 23*  --  18  CREATININE 1.15  --  0.79  CALCIUM 9.1  --  7.9*  MG 2.2 2.0  --    GFR: Estimated Creatinine Clearance: 69.9 mL/min (by C-G formula based on Cr of 0.79). Liver Function Tests:  Recent Labs Lab 10/31/15 0202  AST 18  ALT 14*  ALKPHOS 36*  BILITOT 1.6*  PROT 5.5*  ALBUMIN 3.6   No results for input(s): LIPASE, AMYLASE in the last 168 hours. No results for input(s): AMMONIA in the last 168 hours. Coagulation Profile: No results for input(s): INR, PROTIME in the last 168 hours. Cardiac Enzymes:  Recent Labs Lab 10/30/15 1405 10/30/15 1939 10/31/15 0202 10/31/15 0525  TROPONINI <0.03 0.03* <0.03 <0.03   BNP (last 3 results) No results for input(s): PROBNP in the last 8760  hours. HbA1C: No results for input(s): HGBA1C in the last 72 hours. CBG: No results for input(s): GLUCAP in the last 168 hours. Lipid Profile: No results for input(s): CHOL, HDL, LDLCALC, TRIG, CHOLHDL, LDLDIRECT in the last 72 hours. Thyroid Function Tests:  Recent Labs  10/30/15 1405  TSH 2.718   Anemia Panel: No results for input(s): VITAMINB12, FOLATE, FERRITIN, TIBC, IRON, RETICCTPCT in the last 72 hours. Urine analysis:    Component Value Date/Time   COLORURINE YELLOW 10/30/2015 1552   APPEARANCEUR CLEAR 10/30/2015 1552   LABSPEC 1.015 10/30/2015 1552   PHURINE 5.5 10/30/2015 1552   GLUCOSEU NEGATIVE 10/30/2015 1552   HGBUR NEGATIVE 10/30/2015 1552   BILIRUBINUR NEGATIVE 10/30/2015 1552   KETONESUR NEGATIVE 10/30/2015 1552   PROTEINUR NEGATIVE 10/30/2015 1552   NITRITE NEGATIVE 10/30/2015 1552   LEUKOCYTESUR NEGATIVE 10/30/2015 1552   Sepsis Labs: @LABRCNTIP (procalcitonin:4,lacticidven:4)  )No results found for this or any previous visit (from the past 240 hour(s)).       Radiology Studies: Dg Ribs Unilateral W/chest Right  10/30/2015  CLINICAL DATA:  Status post fall yesterday with a right chest injury. Pain. EXAM: RIGHT RIBS AND CHEST - 3+ VIEW COMPARISON:  PA and lateral chest 10/16/2015. Single view of the chest 09/08/2006. FINDINGS: The lungs are clear. No pneumothorax or pleural effusion. Heart size is upper normal. Aortic atherosclerosis is noted. Remote healed right sixth and seventh rib fractures are unchanged. No acute fracture is identified. IMPRESSION: No acute abnormality. Remote healed right sixth and seventh rib fractures. Atherosclerosis. Electronically Signed   By: Inge Rise M.D.   On: 10/30/2015 16:35   Ct Head Wo Contrast  10/30/2015  CLINICAL DATA:  Confusion EXAM: CT HEAD WITHOUT CONTRAST TECHNIQUE: Contiguous axial images were obtained from the base of the skull through the vertex without intravenous contrast. COMPARISON:  10/16/2015  FINDINGS: No skull fracture is noted. Paranasal sinuses and mastoid air cells are unremarkable. No intracranial hemorrhage, mass effect or midline shift. Stable atrophy.  Ventricular size is stable from prior exam. No acute cortical infarction. No mass lesion is noted on this unenhanced scan. 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 IMPRESSION: No acute intracranial abnormality. No definite acute cortical infarction. Stable cerebral atrophy. Again noted old infarct in left cerebellum. Electronically Signed   By: Lahoma Crocker M.D.   On: 10/30/2015 16:42        Scheduled Meds: . atenolol  50 mg Oral Daily  . cloNIDine  0.2 mg Oral BID  . diltiazem  240 mg Oral Daily  . docusate sodium  100 mg Oral Daily  . dorzolamide-timolol  1 drop Both Eyes Daily  . feeding supplement (ENSURE ENLIVE)  237 mL Oral BID BM  .  polyvinyl alcohol  2 drop Both Eyes BID  . potassium chloride  20 mEq Oral Once  . rivaroxaban  20 mg Oral QPM  . sodium chloride flush  3 mL Intravenous Q12H   Continuous Infusions: . 0.9 % NaCl with KCl 20 mEq / L 75 mL/hr at 10/31/15 0953     LOS: 1 day    Time spent: Powell, Jehanzeb, MD [facility] Pager [phone]  If 7PM-7AM, please contact night-coverage [email or web address] Password [email or web address] 10/31/2015, 11:22 AM     By signing my name below, I, Rennis Harding, attest that this documentation has been prepared under the direction and in the presence of Kathie Dike, MD. Electronically signed: Rennis Harding, Scribe. 07/18/201712:35 PM   I, Dr. Kathie Dike, personally performed the services described in this documentaiton. All medical record entries made by the scribe were at my direction and in my presence. I have reviewed the chart and agree that the record reflects my personal performance and is accurate and complete  Kathie Dike, MD, 10/31/2015 12:49 PM

## 2015-10-31 NOTE — Progress Notes (Signed)
Nutrition Brief Note  Patient identified on the Malnutrition Screening Tool (MST) Report  Wt Readings from Last 15 Encounters:  10/30/15 153 lb (69.4 kg)  10/16/15 165 lb (74.844 kg)  09/08/15 170 lb (77.111 kg)  01/12/15 166 lb 9.6 oz (75.569 kg)  04/30/14 170 lb (77.111 kg)  03/22/14 167 lb 3.2 oz (75.841 kg)  10/14/13 169 lb (76.658 kg)  03/02/13 172 lb 3.2 oz (78.109 kg)  09/08/12 168 lb (76.204 kg)   Body mass index is 21.95 kg/(m^2). Patient meets criteria for Healthy wt for Ht  based on current BMI.   Pt seem sitting in chair eating lunch. He reports no changes in appetite. He gave his normal bw as 160 lbs. He was weighed at 165 lbs on 7/3. He was started on diamox this last Thursday which cardiology believes was cause of dehydration. Likely his weight is likely artificially low due volume depletion. He currently is eating well. He takes a one a day mvi at home. He does not take any Ensure/Boost and declined any while admitted, "Can I just have a bowl of ice cream"  RD did briefly go over how to determine if he is dehydrated to prevent this from recurring. He does report drinking fluid at meals and between meals, but says his urine stays dark.   Current diet order is Heart Healthy, patient is consuming approximately 100% of meals at this time. Labs and medications reviewed.   No nutrition interventions warranted at this time. If nutrition issues arise, please consult RD.   Burtis Junes RD, LDN, CNSC Clinical Nutrition Pager: [phone] 10/31/2015 11:56 AM

## 2015-10-31 NOTE — Progress Notes (Signed)
CRITICAL VALUE ALERT  Critical value received:  Troponin 0.03  Date of notification:  10/30/15  Time of notification:  2135  Critical value read back: yes  Nurse who received alert: L. Leanza Shepperson,RN  MD notified (1st page):  Tylene Fantasia, NP (midlevel)  Time of first page:  2152  MD notified (2nd page):  Time of second page:  Responding MD:   Time MD responded:

## 2015-10-31 NOTE — Evaluation (Signed)
Physical Therapy Evaluation Patient Details Name: Zachary Arellano MRN: [id number] DOB: 1931/10/02 Today's Date: 10/31/2015   History of Present Illness  82 yo M admitted 10/30/2015 s/p fall on Sunday and bruised some ribs on the right. Pt found to have hypotension & mild confusion. Dx: dehydration due to recent trial of Diamox for glaucoma. Severe hyponatremia and bradycardia. PMH: glaucoma, HTN, chronic Afib, CVA, rib fx.  Clinical Impression  Pt received sitting up in the chair, family present, and pt is agreeable to PT evaluation.  Pt states that he is normally independent with ambulation, as well as dressing and bathing.  Today during PT evaluation he demonstrates antalgic gait with fwd flexed posture.  He required Min guard for sit<>stand transfers, and Min guard for ambulation with a RW.  Pt required Mod A for transfer sit<>supine due to increased pain.  At this point, recommend that pt use a RW for ambulation - they have 2 at home - and HHPT to promote increased strength and balance to return to PLOF.    Follow Up Recommendations Home health PT    Equipment Recommendations  None recommended by PT - pt states they have 2 RW's at home already.    Recommendations for Other Services       Precautions / Restrictions Precautions Precautions: Fall Precaution Comments: Reason for admission.  Restrictions Weight Bearing Restrictions: No      Mobility  Bed Mobility Overal bed mobility: Needs Assistance Bed Mobility: Sit to Supine       Sit to supine: Mod assist   General bed mobility comments: Educated pt on using log roll to get into the bed instead of swinging legs in.    Transfers Overall transfer level: Needs assistance Equipment used: Rolling walker (2 wheeled) Transfers: Sit to/from Stand Sit to Stand: Min guard         General transfer comment: Pt required increased instruction due to poor spatial awarness for transfer onto the bathroom commode.     Ambulation/Gait Ambulation/Gait assistance: Min guard Ambulation Distance (Feet): 50 Feet Assistive device: Rolling walker (2 wheeled) Gait Pattern/deviations: Step-to pattern;Trunk flexed     General Gait Details: Self limited gait distance - pt expressed that he could probably go further, however he wanted to lay down in the bed.    Stairs            Wheelchair Mobility    Modified Rankin (Stroke Patients Only)       Balance Overall balance assessment: Needs assistance Sitting-balance support: Bilateral upper extremity supported Sitting balance-Leahy Scale: Good     Standing balance support: Bilateral upper extremity supported Standing balance-Leahy Scale: Fair                               Pertinent Vitals/Pain Pain Assessment: 0-10 Pain Score: 2  Pain Location: R hip Pain Descriptors / Indicators: Aching Pain Intervention(s): Limited activity within patient's tolerance    Home Living   Living Arrangements: Spouse/significant other Available Help at Discharge: Family (son states that if they need to they can.  Airline pilot care) Type of Home: House Home Access: Stairs to enter   [employer] of Steps: 1 step at the back door.   Home Layout: Two level Home Equipment: Walker - 2 wheels      Prior Function     Gait / Transfers Assistance Needed: Pt and family confirm that pt was independent with ambulation.  ADL's / Homemaking Assistance Needed: independent with dressing and bathing.         Hand Dominance   Dominant Hand: Right    Extremity/Trunk Assessment   Upper Extremity Assessment: Overall WFL for tasks assessed           Lower Extremity Assessment: Generalized weakness         Communication   Communication: No difficulties  Cognition Arousal/Alertness: Awake/alert Behavior During Therapy: WFL for tasks assessed/performed Overall Cognitive Status: Within Functional Limits for tasks assessed                       General Comments      Exercises        Assessment/Plan    PT Assessment Patient needs continued PT services  PT Diagnosis Difficulty walking;Abnormality of gait;Generalized weakness;Acute pain   PT Problem List Decreased strength;Decreased range of motion;Decreased activity tolerance;Decreased balance;Decreased mobility;Decreased knowledge of use of DME;Decreased safety awareness;Decreased knowledge of precautions;Pain  PT Treatment Interventions DME instruction;Gait training;Stair training;Functional mobility training;Therapeutic activities;Therapeutic exercise;Balance training;Patient/family education   PT Goals (Current goals can be found in the Care Plan section) Acute Rehab PT Goals Patient Stated Goal: Pt wants to get strong enough to go back home. PT Goal Formulation: With patient/family Time For Goal Achievement: 11/07/15 Potential to Achieve Goals: Good    Frequency Min 5X/week   Barriers to discharge        Co-evaluation               End of Session Equipment Utilized During Treatment: Gait belt Activity Tolerance: Patient tolerated treatment well Patient left: in bed;with call bell/phone within reach;with family/visitor present Nurse Communication: Mobility status    Functional Assessment Tool Used: [employer] "6-clicks"  Functional Limitation: Mobility: Walking and moving around Mobility: Walking and Moving Around Current Status [phone]): At least 40 percent but less than 60 percent impaired, limited or restricted Mobility: Walking and Moving Around Goal Status [phone]): At least 20 percent but less than 40 percent impaired, limited or restricted    Time: 1330-1354 PT Time Calculation (min) (ACUTE ONLY): 24 min   Charges:   PT Evaluation $PT Eval Low Complexity: 1 Procedure PT Treatments $Gait Training: 8-22 mins   PT G Codes:   PT G-Codes **NOT FOR INPATIENT CLASS** Functional Assessment Tool Used: [employer] "6-clicks"  Functional Limitation: Mobility: Walking and moving around Mobility: Walking and Moving Around Current Status [phone]): At least 40 percent but less than 60 percent impaired, limited or restricted Mobility: Walking and Moving Around Goal Status [phone]): At least 20 percent but less than 40 percent impaired, limited or restricted   Beth Melissa Tomaselli, PT, DPT X: [phone]

## 2015-10-31 NOTE — Progress Notes (Addendum)
CRITICAL VALUE ALERT  Critical value received:  K+ 2.5  Date of notification:  10/31/15  Time of notification:  0316  Critical value read back: yes    Nurse who received alert:  L. Corine Shelter, RN  MD notified (1st page):  Dr. Lorin Mercy  Time of first page:  0323  MD notified (2nd page):  Time of second page:  Responding MD:  Dr. Lorin Mercy   Time MD responded:  0330  *New order received for 40 mEq of PO potassium and 4 runs of iv potassium.

## 2015-11-01 DIAGNOSIS — I495 Sick sinus syndrome: Secondary | ICD-10-CM

## 2015-11-01 LAB — BASIC METABOLIC PANEL
Anion gap: 8 (ref 5–15)
BUN: 18 mg/dL (ref 6–20)
CHLORIDE: 97 mmol/L — AB (ref 101–111)
CO2: 21 mmol/L — AB (ref 22–32)
CREATININE: 0.79 mg/dL (ref 0.61–1.24)
Calcium: 8.9 mg/dL (ref 8.9–10.3)
GFR calc non Af Amer: >60 mL/min (ref >60)
Glucose, Bld: 111 mg/dL — ABNORMAL HIGH (ref 65–99)
POTASSIUM: 4.4 mmol/L (ref 3.5–5.1)
Sodium: 126 mmol/L — ABNORMAL LOW (ref 135–145)

## 2015-11-01 MED ORDER — DILTIAZEM HCL ER COATED BEADS 120 MG PO CP24
120.0000 mg | ORAL_CAPSULE | Freq: Every day | ORAL | Status: DC
  Administered 2015-11-02: 120 mg via ORAL
  Filled 2015-11-01: qty 1

## 2015-11-01 MED ORDER — BENZONATATE 100 MG PO CAPS
100.0000 mg | ORAL_CAPSULE | Freq: Three times a day (TID) | ORAL | Status: DC | PRN
  Administered 2015-11-02: 100 mg via ORAL
  Filled 2015-11-01 (×2): qty 1

## 2015-11-01 MED ORDER — CLONIDINE HCL 0.1 MG PO TABS
0.1000 mg | ORAL_TABLET | Freq: Two times a day (BID) | ORAL | Status: DC
  Administered 2015-11-01 – 2015-11-06 (×10): 0.1 mg via ORAL
  Filled 2015-11-01 (×10): qty 1

## 2015-11-01 MED ORDER — MAGNESIUM HYDROXIDE 400 MG/5ML PO SUSP
960.0000 mL | Freq: Once | ORAL | Status: AC
  Administered 2015-11-01: 960 mL via RECTAL
  Filled 2015-11-01: qty 240

## 2015-11-01 NOTE — Care Management Important Message (Signed)
Important Message  Patient Details  Name: Zachary Arellano MRN: [id number] Date of Birth: 1932/04/03   Medicare Important Message Given:  Yes    Keyanah Kozicki, [name], RN 11/01/2015, 1:10 PM

## 2015-11-01 NOTE — Care Management Note (Signed)
Case Management Note  Patient Details  Name: Zachary Arellano MRN: [id number] Date of Birth: Aug 20, 1931  Subjective/Objective:   Patient from home. Has PCP and Medicare. Reports no issues with obtaining medications. Wife is at bedside, patient is working with PT during assessment. Wife mentions SNF for rehab PT, however PT is recommending HHPT.                  Action/Plan: Offered choice for HH to patient and spouse. Will be using [facility]. [name] of [facility] notifed and will obtain information from chart. Patient aware that [facility] has 48 hours to make first visit. Patient declined need for walker, has one at home. Will add HHRN and CSW to home health orders to follow patient as well.    Expected Discharge Date:  11/02/15               Expected Discharge Plan:  [name]  In-House Referral:  NA  Discharge planning Services  CM Consult  Post Acute Care Choice:  Home Health Choice offered to:  Patient, Spouse  DME Arranged:    DME Agency:     HH Arranged:  RN, PT, Social Work [name]:  [name]  Status of Service:  Completed, signed off  If discussed at [employer], dates discussed:    Additional Comments:  Zachary Arellano, Zachary Reading, RN 11/01/2015, 11:09 AM

## 2015-11-01 NOTE — Progress Notes (Signed)
PROGRESS NOTE    Zachary Arellano  [id number] DOB: November 21, 1931 DOA: 10/30/2015 PCP: [name], MD  Outpatient Specialists:     Brief Narrative:  35 yom with a hx of afib (CHADsVASc=3), HTN, and glaucoma presented with complaints of hypotension and generalized weakness. While in the ED he was noted to be hypokalemic and hyponatremic. Pt was admitted for severe hyponatremia.   Assessment & Plan:   Active Problems:   Atrial fibrillation (HCC)   Essential hypertension   Hyponatremia   Hypokalemia   Symptomatic bradycardia   General weakness   Rib contusion   1. Hyponatremia. Likely secondary to diuretics and dehydration. For now, continue to hold lisinopril/HTCZ and acetazolamide. Patient is continued on IVF. Sodium is improving 2. Hypokalemia. Replaced potassium. Magnesium normal range.  3. Tachybradycardia syndrome possibly related to electrolyte disturbances. This afternoon, patient noted to have sustained heart rate into the low 40s. Otherwise a symptomatic. Discussed case with cardiology. Plan to decrease patient's Cardizem from 240 mg daily to 120 mg daily. Also plan to decrease clonidine from 0.2 mg twice a day to 0.1 mg by mouth twice a day. For now, continue patient's atenolol at 50 mg. 4. Afib CHADsVasc =3 . Anticoagulated on xarelto. HR is currently controlled.  5. Constipation. Started on miralax prn. with no significant result. We'll give a trial of smog enema, as patient reports last bowel movement was approximately 1 week ago. 6. Gait instability. Pt had a fall on 7/15 he is complaining of pain in right hip will follow up Xrays. PT consult requested   7. Essential HTN. Stable.    DVT prophylaxis: Xarelto  Code Status: Full Family Communication: Family bedside  Disposition Plan: Discharge home once improved    Consultants:   Cardiology   Procedures:   None                   Antimicrobials:   None    Subjective: Patient good spirits. Eager to go  home.   Objective: Filed Vitals:   10/30/15 2253 10/31/15 0610 10/31/15 2100 11/01/15 0648  BP: 107/59 160/88 106/49 178/101  Pulse: 39 80 44 94  Temp: 97.8 F (36.6 C) 98.1 F (36.7 C) 98.3 F (36.8 C) 97.7 F (36.5 C)  TempSrc: Oral Oral Oral Oral  Resp: 17 18 18 17   Height:      Weight:  68.675 kg (151 lb 6.4 oz)  71.169 kg (156 lb 14.4 oz)  SpO2: 98% 100% 100% 95%    Intake/Output Summary (Last 24 hours) at 11/01/15 1724 Last data filed at 11/01/15 1042  Gross per 24 hour  Intake   2040 ml  Output    700 ml  Net   1340 ml   Filed Weights   10/30/15 1747 10/31/15 0610 11/01/15 0648  Weight: 69.4 kg (153 lb) 68.675 kg (151 lb 6.4 oz) 71.169 kg (156 lb 14.4 oz)    Examination:  General exam: Appears calm and comfortable, Lying in bed  Respiratory system: Clear to auscultation. Respiratory effort normal. Cardiovascular system: S1 & S2 heard, RRR.  Gastrointestinal system: Abdomen is nondistended, soft and nontender. No organomegaly or masses felt. Normal bowel sounds heard. Central nervous system: Alert and oriented. No focal neurological deficits. Extremities:Tenderness to palpation over superior aspect of right hip  Skin: No rashes, lesions or ulcers Psychiatry: Judgement and insight appear normal. Mood & affect appropriate.     Data Reviewed: I have personally reviewed following labs and imaging studies  CBC:  Recent Labs Lab 10/30/15 1405 10/31/15 0202  WBC 12.9* 6.9  NEUTROABS 10.3*  --   HGB 13.6 12.4*  HCT 37.4* 33.3*  MCV 91.7 91.2  PLT 198 [id number]   Basic Metabolic Panel:  Recent Labs Lab 10/30/15 1405 10/30/15 1939 10/31/15 0202 10/31/15 1707 11/01/15 0538  NA 121*  --  120* 122* 126*  K 2.6*  --  2.5* 4.2 4.4  CL 89*  --  92* 93* 97*  CO2 22  --  22 23 21*  GLUCOSE 119*  --  102* 110* 111*  BUN 23*  --  18 19 18   CREATININE 1.15  --  0.79 1.02 0.79  CALCIUM 9.1  --  7.9* 8.3* 8.9  MG 2.2 2.0  --   --   --    GFR: Estimated  Creatinine Clearance: 71.7 mL/min (by C-G formula based on Cr of 0.79). Liver Function Tests:  Recent Labs Lab 10/31/15 0202  AST 18  ALT 14*  ALKPHOS 36*  BILITOT 1.6*  PROT 5.5*  ALBUMIN 3.6   No results for input(s): LIPASE, AMYLASE in the last 168 hours. No results for input(s): AMMONIA in the last 168 hours. Coagulation Profile: No results for input(s): INR, PROTIME in the last 168 hours. Cardiac Enzymes:  Recent Labs Lab 10/30/15 1405 10/30/15 1939 10/31/15 0202 10/31/15 0525  TROPONINI <0.03 0.03* <0.03 <0.03   BNP (last 3 results) No results for input(s): PROBNP in the last 8760 hours. HbA1C: No results for input(s): HGBA1C in the last 72 hours. CBG: No results for input(s): GLUCAP in the last 168 hours. Lipid Profile: No results for input(s): CHOL, HDL, LDLCALC, TRIG, CHOLHDL, LDLDIRECT in the last 72 hours. Thyroid Function Tests:  Recent Labs  10/30/15 1405  TSH 2.718   Anemia Panel: No results for input(s): VITAMINB12, FOLATE, FERRITIN, TIBC, IRON, RETICCTPCT in the last 72 hours. Urine analysis:    Component Value Date/Time   COLORURINE YELLOW 10/30/2015 1552   APPEARANCEUR CLEAR 10/30/2015 1552   LABSPEC 1.015 10/30/2015 1552   PHURINE 5.5 10/30/2015 1552   GLUCOSEU NEGATIVE 10/30/2015 1552   HGBUR NEGATIVE 10/30/2015 1552   BILIRUBINUR NEGATIVE 10/30/2015 1552   KETONESUR NEGATIVE 10/30/2015 1552   PROTEINUR NEGATIVE 10/30/2015 1552   NITRITE NEGATIVE 10/30/2015 1552   LEUKOCYTESUR NEGATIVE 10/30/2015 1552   Sepsis Labs: @LABRCNTIP (procalcitonin:4,lacticidven:4)  )No results found for this or any previous visit (from the past 240 hour(s)).       Radiology Studies: Dg Hip Unilat With Pelvis 2-3 Views Right  10/31/2015  CLINICAL DATA:  Pain following fall EXAM: DG HIP (WITH OR WITHOUT PELVIS) 2-3V RIGHT COMPARISON:  None. FINDINGS: Frontal pelvis as well as frontal and lateral right hip images were obtained. Bones are osteoporotic. No  acute fracture or dislocation is apparent. There is mild symmetric narrowing of both hip joints. There is calcification adjacent to the greater trochanter on the right, likely due to calcific tendinosis. There is levorotoscoliosis in the lower lumbar region with degenerative type change in this area. There are focal areas of vascular calcification. IMPRESSION: Osteoporosis. Osteoarthritic change in the lower lumbar spine and both hip regions. Probable calcific tendinosis lateral right proximal femur region adjacent to the greater trochanter. No acute fracture or dislocation. There is what is felt to represent a Mach band in the proximal right femur, seen to extend beyond the confines of the bone. Atherosclerotic calcification noted at several sites. Electronically Signed   By: Lowella Grip III M.D.   On:  10/31/2015 15:39        Scheduled Meds: . atenolol  50 mg Oral Daily  . cloNIDine  0.1 mg Oral BID  . [START ON 11/02/2015] diltiazem  120 mg Oral Daily  . docusate sodium  100 mg Oral Daily  . dorzolamide-timolol  1 drop Both Eyes Daily  . polyvinyl alcohol  2 drop Both Eyes BID  . potassium chloride  20 mEq Oral Once  . rivaroxaban  20 mg Oral QPM  . sodium chloride flush  3 mL Intravenous Q12H   Continuous Infusions: . sodium chloride 75 mL/hr at 11/01/15 0959     LOS: 2 days     Marylu Lund, MD [facility] Pager [phone]  If 7PM-7AM, please contact night-coverage [email or web address] Password [facility] 11/01/2015, 5:24 PM

## 2015-11-01 NOTE — Progress Notes (Signed)
Primary cardiologist: Dr. Lyman Bishop  Seen for followup: Bradycardia and atrial fibrillation  Subjective:    Up in bed side chair, ate lunch. Feels better overall. No chest pain or palpitations.  Objective:   Temp:  [97.7 F (36.5 C)-98.3 F (36.8 C)] 97.7 F (36.5 C) (07/19 0648) Pulse Rate:  [44-94] 94 (07/19 0648) Resp:  [17-18] 17 (07/19 0648) BP: (106-178)/(49-101) 178/101 mmHg (07/19 0648) SpO2:  [95 %-100 %] 95 % (07/19 0648) Weight:  [156 lb 14.4 oz (71.169 kg)] 156 lb 14.4 oz (71.169 kg) (07/19 0648) Last BM Date: 10/26/15 (per patient)  Filed Weights   10/30/15 1747 10/31/15 0610 11/01/15 0648  Weight: 153 lb (69.4 kg) 151 lb 6.4 oz (68.675 kg) 156 lb 14.4 oz (71.169 kg)    Intake/Output Summary (Last 24 hours) at 11/01/15 1208 Last data filed at 11/01/15 1042  Gross per 24 hour  Intake   2040 ml  Output    700 ml  Net   1340 ml    Telemetry: Atrial fibrillation with intermittent tachycardia and bradycardia.  Exam:  General: Elderly male in no distress.  Lungs: Clear, nonlabored.  Cardiac: Irregularly irregular, no gallop.  Extremities: No pitting edema.  Lab Results:  Basic Metabolic Panel:  Recent Labs Lab 10/30/15 1405 10/30/15 1939 10/31/15 0202 10/31/15 1707 11/01/15 0538  NA 121*  --  120* 122* 126*  K 2.6*  --  2.5* 4.2 4.4  CL 89*  --  92* 93* 97*  CO2 22  --  22 23 21*  GLUCOSE 119*  --  102* 110* 111*  BUN 23*  --  18 19 18   CREATININE 1.15  --  0.79 1.02 0.79  CALCIUM 9.1  --  7.9* 8.3* 8.9  MG 2.2 2.0  --   --   --     Liver Function Tests:  Recent Labs Lab 10/31/15 0202  AST 18  ALT 14*  ALKPHOS 36*  BILITOT 1.6*  PROT 5.5*  ALBUMIN 3.6    CBC:  Recent Labs Lab 10/30/15 1405 10/31/15 0202  WBC 12.9* 6.9  HGB 13.6 12.4*  HCT 37.4* 33.3*  MCV 91.7 91.2  PLT 198 180    Cardiac Enzymes:  Recent Labs Lab 10/30/15 1939 10/31/15 0202 10/31/15 0525  TROPONINI 0.03* <0.03 <0.03     Medications:   Scheduled Medications: . atenolol  50 mg Oral Daily  . cloNIDine  0.2 mg Oral BID  . diltiazem  240 mg Oral Daily  . docusate sodium  100 mg Oral Daily  . dorzolamide-timolol  1 drop Both Eyes Daily  . polyvinyl alcohol  2 drop Both Eyes BID  . potassium chloride  20 mEq Oral Once  . rivaroxaban  20 mg Oral QPM  . sodium chloride flush  3 mL Intravenous Q12H     Infusions: . sodium chloride 75 mL/hr at 11/01/15 0959     PRN Medications:  acetaminophen **OR** acetaminophen, mineral oil, polyethylene glycol   Assessment:   1. 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 Atenolol dose was cut back to 50 mg daily, he otherwise continues on Cardizem CD 240 mg daily. Continue current regimen for now including Xarelto for stroke prophylaxis.  2. Essential hypertension, blood pressure is up today. He is otherwise on clonidine. May need to consider another agent such as ARB if trend remains up now following adequate hydration.  3. Hyponatremia and hypokalemia, both improving. Diamox (treatment of glaucoma) was stopped.   Plan/Discussion:  No change to current heart rate control regimen as outlined above. This may need further adjustment, although predominantly with slower heart rates now. Continue Xarelto for stroke prophylaxis. May need additional medication adjustments for blood pressure control depending on trend, possibly consider an ARB. No further cardiac testing is planned at this time.   Satira Sark, M.D., F.A.C.C.

## 2015-11-01 NOTE — Progress Notes (Signed)
Physical Therapy Treatment Patient Details Name: Zachary Arellano MRN: [id number] DOB: Dec 01, 1931 Today's Date: 11/01/2015    History of Present Illness 82 yo M admitted 10/30/2015 s/p fall on Sunday and bruised some ribs on the right. Pt found to have hypotension & mild confusion. Dx: dehydration due to recent trial of Diamox for glaucoma. Severe hyponatremia and bradycardia. 10/31/2015 XR of pelvis impression: Osteoporosis. Osteoarthritic change in the lower lumbar spine and both hip regions. Probable calcific tendinosis lateral right proximal femur region  adjacent to the greater trochanter. No acute fracture or dislocation. There is what is felt to represent a Mach band in the proximal right femur, seen to extend beyond the confines of the bone. Atherosclerotic calcification noted at several sites.  PMH: glaucoma, HTN, chronic Afib, CVA, rib fx.    PT Comments    Pt received in bed, and was agreeable to PT tx.  Pt demonstrated improved gait and mobility today, however still need to progress with safety awareness with sit<>stand transfers.  Pt does not always get himself lined up with chair prior to sitting down.  Heavy education on safety with transfers given today.  Pt also ambulated 364ft with RW and and Min guard, and negotiated 3 steps with Min guard.  Continue to recommend HHPT upon d/c, but would also recommend 24/7 supervision/assistance for safety awareness.   Follow Up Recommendations  Home health PT;Supervision/Assistance - 24 hour     Equipment Recommendations  None recommended by PT    Recommendations for Other Services       Precautions / Restrictions Precautions Precautions: Fall Precaution Comments: Reason for admission.  Restrictions Weight Bearing Restrictions: No    Mobility  Bed Mobility Overal bed mobility: Modified Independent                Transfers Overall transfer level: Needs assistance   Transfers: Sit to/from Stand Sit to Stand: Supervision          General transfer comment: Pt required vc's for safety with hand placement on multiple occasions.  Pt ambulated into the bathroom and was able to void, then ambulated back to the bed and was instructed to sit on the EOB to don undergarments.  Pt proceeded to attempt to sit down next to the bed instead of getting lined up to sit on the bed.  Easily corrected with vc's from PT to stop and get lined up first.   Ambulation/Gait Ambulation/Gait assistance: Min guard Ambulation Distance (Feet): 300 Feet Assistive device: Rolling walker (2 wheeled) Gait Pattern/deviations: Step-through pattern         Stairs Stairs: Yes Stairs assistance: Min guard Stair Management: Two rails;Alternating pattern Number of Stairs: 3 (limited due to IV)    Wheelchair Mobility    Modified Rankin (Stroke Patients Only)       Balance           Standing balance support: Bilateral upper extremity supported Standing balance-Leahy Scale: Fair                      Cognition Arousal/Alertness: Awake/alert Behavior During Therapy: WFL for tasks assessed/performed Overall Cognitive Status: Impaired/Different from baseline Area of Impairment: Safety/judgement     Memory: Decreased recall of precautions;Decreased short-term memory   Safety/Judgement: Decreased awareness of safety;Decreased awareness of deficits     General Comments: Pt requires repetition for safety with hand placement during transfers.      Exercises      General Comments  Pertinent Vitals/Pain Pain Assessment: No/denies pain    Home Living                      Prior Function            PT Goals (current goals can now be found in the care plan section) Acute Rehab PT Goals Patient Stated Goal: Pt wants to get strong enough to go back home. PT Goal Formulation: With patient/family Time For Goal Achievement: 11/07/15 Potential to Achieve Goals: Good Progress towards PT goals:  Progressing toward goals    Frequency  Min 5X/week    PT Plan Current plan remains appropriate    Co-evaluation             End of Session Equipment Utilized During Treatment: Gait belt Activity Tolerance: Patient tolerated treatment well Patient left: in chair;with chair alarm set;with family/visitor present     Time: 1020-1110 PT Time Calculation (min) (ACUTE ONLY): 50 min  Charges:  $Gait Training: 8-22 mins $Therapeutic Activity: 23-37 mins                    G Codes:      Beth Alyx Mcguirk, PT, DPT X: [phone]

## 2015-11-02 ENCOUNTER — Inpatient Hospital Stay (HOSPITAL_COMMUNITY): Payer: Medicare Other

## 2015-11-02 LAB — BASIC METABOLIC PANEL
ANION GAP: 7 (ref 5–15)
BUN: 14 mg/dL (ref 6–20)
CALCIUM: 8.3 mg/dL — AB (ref 8.9–10.3)
CO2: 23 mmol/L (ref 22–32)
Chloride: 93 mmol/L — ABNORMAL LOW (ref 101–111)
Creatinine, Ser: 0.65 mg/dL (ref 0.61–1.24)
GFR calc Af Amer: >60 mL/min (ref >60)
GLUCOSE: 111 mg/dL — AB (ref 65–99)
POTASSIUM: 3.8 mmol/L (ref 3.5–5.1)
SODIUM: 123 mmol/L — AB (ref 135–145)

## 2015-11-02 LAB — BRAIN NATRIURETIC PEPTIDE: B NATRIURETIC PEPTIDE 5: 712 pg/mL — AB (ref 0.0–100.0)

## 2015-11-02 MED ORDER — FLUTICASONE PROPIONATE 50 MCG/ACT NA SUSP
1.0000 | Freq: Every day | NASAL | Status: DC
  Administered 2015-11-03 – 2015-11-06 (×4): 1 via NASAL
  Filled 2015-11-02: qty 16

## 2015-11-02 MED ORDER — DILTIAZEM HCL ER COATED BEADS 180 MG PO CP24
180.0000 mg | ORAL_CAPSULE | Freq: Every day | ORAL | Status: DC
  Administered 2015-11-03 – 2015-11-06 (×4): 180 mg via ORAL
  Filled 2015-11-02 (×5): qty 1

## 2015-11-02 NOTE — Progress Notes (Signed)
PROGRESS NOTE    Zachary Arellano  [id number] DOB: 22-May-1931 DOA: 10/30/2015 PCP: [name], MD  Outpatient Specialists:     Brief Narrative:  44 yom with a hx of afib (CHADsVASc=3), HTN, and glaucoma presented with complaints of hypotension and generalized weakness. While in the ED he was noted to be hypokalemic and hyponatremic. Pt was admitted for severe hyponatremia.   Assessment & Plan:   Active Problems:   Atrial fibrillation (HCC)   Essential hypertension   Hyponatremia   Hypokalemia   Symptomatic bradycardia   General weakness   Rib contusion   1. Hyponatremia. Likely secondary to diuretics and dehydration. For now, continue to hold lisinopril/HTCZ and acetazolamide. Patient was continued on IVF initially with improvement. Today, sodium trended down to 123. CXR with findings of pulm edema while on basal IVF. BNP elevated at 712. Will hold IVF. Patient is diuresing well. 2. Hypokalemia. Replaced potassium. Magnesium normal range.  3. Tachybradycardia syndrome possibly related to electrolyte disturbances. This afternoon, patient noted to have sustained heart rate into the low 40s. Otherwise a symptomatic. Discussed case with cardiology. Initially decreased patient's Cardizem from 240 mg daily to 120 mg daily. Also plan to decrease clonidine from 0.2 mg twice a day to 0.1 mg by mouth twice a day. Continued patient's atenolol at 50 mg. Patient more tachy this AM, thus increased cardizem to 180mg  daily per Cardiology recs 4. Afib CHADsVasc =3 . Anticoagulated on xarelto. HR is currently controlled.  5. Constipation. Started on miralax prn. with no significant result. We'll give a trial of smog enema, as patient reports last bowel movement was approximately 1 week ago. 6. Gait instability. Pt had a fall on 7/15 he is complaining of pain in right hip will follow up Xrays. PT consult requested   7. Essential HTN. Stable.  8.  Suspected Acutely decompensated CHF - per above evidence of  volume overload with elevated BNP. Hold further fluids. 2d echo ordered, pending   DVT prophylaxis: Xarelto  Code Status: Full Family Communication: Family bedside  Disposition Plan: Discharge home once improved    Consultants:   Cardiology   Procedures:   None                   Antimicrobials:   None    Subjective: No complaints today   Objective: Filed Vitals:   11/01/15 0648 11/01/15 1400 11/01/15 2101 11/02/15 0530  BP: 178/101 106/72 142/92 140/72  Pulse: 94 95 66 70  Temp: 97.7 F (36.5 C) 98 F (36.7 C) 97.5 F (36.4 C) 97.1 F (36.2 C)  TempSrc: Oral  Oral Oral  Resp: 17 18 20 20   Height:      Weight: 71.169 kg (156 lb 14.4 oz)     SpO2: 95% 98% 97% 97%    Intake/Output Summary (Last 24 hours) at 11/02/15 1814 Last data filed at 11/02/15 0535  Gross per 24 hour  Intake 1143.75 ml  Output   1400 ml  Net -256.25 ml   Filed Weights   10/30/15 1747 10/31/15 0610 11/01/15 0648  Weight: 69.4 kg (153 lb) 68.675 kg (151 lb 6.4 oz) 71.169 kg (156 lb 14.4 oz)    Examination:  General exam: Appears calm and comfortable Respiratory system: Clear to auscultation. Respiratory effort normal. Cardiovascular system: S1 & S2 heard, RRR.  Gastrointestinal system: Abdomen is nondistended, soft and nontender. No organomegaly or masses felt. Normal bowel sounds heard. Central nervous system: Alert and oriented. No focal neurological deficits. Extremities:Tenderness  to palpation over superior aspect of right hip  Skin: No rashes, lesions  Psychiatry: Judgement and insight appear normal. Mood & affect appropriate.     Data Reviewed: I have personally reviewed following labs and imaging studies  CBC:  Recent Labs Lab 10/30/15 1405 10/31/15 0202  WBC 12.9* 6.9  NEUTROABS 10.3*  --   HGB 13.6 12.4*  HCT 37.4* 33.3*  MCV 91.7 91.2  PLT 198 99991111   Basic Metabolic Panel:  Recent Labs Lab 10/30/15 1405 10/30/15 1939 10/31/15 0202 10/31/15 1707  11/01/15 0538 11/02/15 0447  NA 121*  --  120* 122* 126* 123*  K 2.6*  --  2.5* 4.2 4.4 3.8  CL 89*  --  92* 93* 97* 93*  CO2 22  --  22 23 21* 23  GLUCOSE 119*  --  102* 110* 111* 111*  BUN 23*  --  18 19 18 14   CREATININE 1.15  --  0.79 1.02 0.79 0.65  CALCIUM 9.1  --  7.9* 8.3* 8.9 8.3*  MG 2.2 2.0  --   --   --   --    GFR: Estimated Creatinine Clearance: 71.7 mL/min (by C-G formula based on Cr of 0.65). Liver Function Tests:  Recent Labs Lab 10/31/15 0202  AST 18  ALT 14*  ALKPHOS 36*  BILITOT 1.6*  PROT 5.5*  ALBUMIN 3.6   No results for input(s): LIPASE, AMYLASE in the last 168 hours. No results for input(s): AMMONIA in the last 168 hours. Coagulation Profile: No results for input(s): INR, PROTIME in the last 168 hours. Cardiac Enzymes:  Recent Labs Lab 10/30/15 1405 10/30/15 1939 10/31/15 0202 10/31/15 0525  TROPONINI <0.03 0.03* <0.03 <0.03   BNP (last 3 results) No results for input(s): PROBNP in the last 8760 hours. HbA1C: No results for input(s): HGBA1C in the last 72 hours. CBG: No results for input(s): GLUCAP in the last 168 hours. Lipid Profile: No results for input(s): CHOL, HDL, LDLCALC, TRIG, CHOLHDL, LDLDIRECT in the last 72 hours. Thyroid Function Tests: No results for input(s): TSH, T4TOTAL, FREET4, T3FREE, THYROIDAB in the last 72 hours. Anemia Panel: No results for input(s): VITAMINB12, FOLATE, FERRITIN, TIBC, IRON, RETICCTPCT in the last 72 hours. Urine analysis:    Component Value Date/Time   COLORURINE YELLOW 10/30/2015 1552   APPEARANCEUR CLEAR 10/30/2015 1552   LABSPEC 1.015 10/30/2015 1552   PHURINE 5.5 10/30/2015 1552   GLUCOSEU NEGATIVE 10/30/2015 1552   HGBUR NEGATIVE 10/30/2015 1552   BILIRUBINUR NEGATIVE 10/30/2015 1552   KETONESUR NEGATIVE 10/30/2015 1552   PROTEINUR NEGATIVE 10/30/2015 1552   NITRITE NEGATIVE 10/30/2015 1552   LEUKOCYTESUR NEGATIVE 10/30/2015 1552   Sepsis  Labs: @LABRCNTIP (procalcitonin:4,lacticidven:4)  )No results found for this or any previous visit (from the past 240 hour(s)).       Radiology Studies: Dg Chest Port 1 View  11/02/2015  CLINICAL DATA:  Congestive heart failure. EXAM: PORTABLE CHEST 1 VIEW COMPARISON:  10/30/2015. FINDINGS: Stable enlarged cardiac silhouette. Interval bibasilar airspace opacity. No visible pleural fluid. Unremarkable bones. IMPRESSION: 1. Interval bibasilar pneumonia or alveolar edema. 2. Stable cardiomegaly. Electronically Signed   By: Claudie Revering M.D.   On: 11/02/2015 16:54        Scheduled Meds: . atenolol  50 mg Oral Daily  . cloNIDine  0.1 mg Oral BID  . [START ON 11/03/2015] diltiazem  180 mg Oral Daily  . docusate sodium  100 mg Oral Daily  . dorzolamide-timolol  1 drop Both Eyes Daily  .  fluticasone  1 spray Each Nare Daily  . polyvinyl alcohol  2 drop Both Eyes BID  . potassium chloride  20 mEq Oral Once  . rivaroxaban  20 mg Oral QPM  . sodium chloride flush  3 mL Intravenous Q12H   Continuous Infusions:     LOS: 3 days     Marylu Lund, MD [facility] Pager [phone]  If 7PM-7AM, please contact night-coverage [email or web address] Password [facility] 11/02/2015, 6:14 PM

## 2015-11-02 NOTE — Progress Notes (Signed)
Primary cardiologist: Dr. Lyman Bishop  Seen for followup: Bradycardia and atrial fibrillation  Subjective:    Less interactive this morning. Was sleeping when I came in the room, did awake to voice and touch to shoulder. Somnolent.  Objective:   Temp:  [97.1 F (36.2 C)-98 F (36.7 C)] 97.1 F (36.2 C) (07/20 0530) Pulse Rate:  [66-95] 70 (07/20 0530) Resp:  [18-20] 20 (07/20 0530) BP: (106-142)/(72-92) 140/72 mmHg (07/20 0530) SpO2:  [97 %-98 %] 97 % (07/20 0530) Last BM Date: 10/26/15  Filed Weights   10/30/15 1747 10/31/15 0610 11/01/15 0648  Weight: 153 lb (69.4 kg) 151 lb 6.4 oz (68.675 kg) 156 lb 14.4 oz (71.169 kg)    Intake/Output Summary (Last 24 hours) at 11/02/15 1125 Last data filed at 11/02/15 0535  Gross per 24 hour  Intake 1893.75 ml  Output   2000 ml  Net -106.25 ml    Telemetry: Atrial fibrillation with increased heart rates, no sustained bradycardia or pauses.  Exam:  General: Elderly male in no distress. Somnolent.  Lungs: Clear, nonlabored.  Cardiac: Irregularly irregular, no gallop.  Extremities: No pitting edema.  Lab Results:  Basic Metabolic Panel:  Recent Labs Lab 10/30/15 1405 10/30/15 1939  10/31/15 1707 11/01/15 0538 11/02/15 0447  NA 121*  --   < > 122* 126* 123*  K 2.6*  --   < > 4.2 4.4 3.8  CL 89*  --   < > 93* 97* 93*  CO2 22  --   < > 23 21* 23  GLUCOSE 119*  --   < > 110* 111* 111*  BUN 23*  --   < > 19 18 14   CREATININE 1.15  --   < > 1.02 0.79 0.65  CALCIUM 9.1  --   < > 8.3* 8.9 8.3*  MG 2.2 2.0  --   --   --   --   < > = values in this interval not displayed.  Liver Function Tests:  Recent Labs Lab 10/31/15 0202  AST 18  ALT 14*  ALKPHOS 36*  BILITOT 1.6*  PROT 5.5*  ALBUMIN 3.6    CBC:  Recent Labs Lab 10/30/15 1405 10/31/15 0202  WBC 12.9* 6.9  HGB 13.6 12.4*  HCT 37.4* 33.3*  MCV 91.7 91.2  PLT 198 180    Cardiac Enzymes:  Recent Labs Lab 10/30/15 1939 10/31/15 0202  10/31/15 0525  TROPONINI 0.03* <0.03 <0.03    Medications:   Scheduled Medications: . atenolol  50 mg Oral Daily  . cloNIDine  0.1 mg Oral BID  . diltiazem  120 mg Oral Daily  . docusate sodium  100 mg Oral Daily  . dorzolamide-timolol  1 drop Both Eyes Daily  . fluticasone  1 spray Each Nare Daily  . polyvinyl alcohol  2 drop Both Eyes BID  . potassium chloride  20 mEq Oral Once  . rivaroxaban  20 mg Oral QPM  . sodium chloride flush  3 mL Intravenous Q12H    Infusions: . sodium chloride 75 mL/hr at 11/01/15 2326    PRN Medications: acetaminophen **OR** acetaminophen, benzonatate, mineral oil, polyethylene glycol   Assessment:   1. 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 Current medications include atenolol at 50 mg daily, Cardizem CD 120 mg daily. Bradycardia has improved, however he now has relatively sustained elevation in heart rate.  2. Essential hypertension, clonidine dose cut back yesterday per primary team. May need to consider another agent such as  ARB if trend remains up.  3. Hyponatremia and hypokalemia, management per primary team. Diamox (treatment of glaucoma) was stopped.   Plan/Discussion:    Continue Xarelto for stroke prophylaxis. Change Cardizem CD to 180 mg daily and continue atenolol. Clonidine is being weaned. May need additional medication adjustments for blood pressure control depending on trend, possibly consider an ARB.   Satira Sark, M.D., F.A.C.C.

## 2015-11-03 ENCOUNTER — Inpatient Hospital Stay (HOSPITAL_COMMUNITY): Payer: Medicare Other

## 2015-11-03 DIAGNOSIS — I509 Heart failure, unspecified: Secondary | ICD-10-CM

## 2015-11-03 LAB — BASIC METABOLIC PANEL
Anion gap: 9 (ref 5–15)
BUN: 8 mg/dL (ref 6–20)
CHLORIDE: 89 mmol/L — AB (ref 101–111)
CO2: 26 mmol/L (ref 22–32)
CREATININE: 0.5 mg/dL — AB (ref 0.61–1.24)
Calcium: 8.5 mg/dL — ABNORMAL LOW (ref 8.9–10.3)
GFR calc Af Amer: >60 mL/min (ref >60)
GFR calc non Af Amer: >60 mL/min (ref >60)
Glucose, Bld: 107 mg/dL — ABNORMAL HIGH (ref 65–99)
Potassium: 3.2 mmol/L — ABNORMAL LOW (ref 3.5–5.1)
Sodium: 124 mmol/L — ABNORMAL LOW (ref 135–145)

## 2015-11-03 LAB — ECHOCARDIOGRAM COMPLETE
AO mean calculated velocity dopler: 81.1 cm/s
AOPV: 0.6 m/s
AOVTI: 26.7 cm
AV Area VTI: 1.88 cm2
AV Area mean vel: 2.02 cm2
AV Peak grad: 8 mmHg
AV VEL mean LVOT/AV: 0.64
AV area mean vel ind: 1.09 cm2/m2
AV peak Index: 1.01
AVAREAVTIIND: 1.18 cm2/m2
AVG: 3 mmHg
AVLVOTPG: 3 mmHg
AVPHT: 642 ms
AVPKVEL: 137 cm/s
CHL CUP AV VALUE AREA INDEX: 1.18
CHL CUP AV VEL: 2.19
E decel time: 180 msec
E/e' ratio: 9.12
FS: 26 % — AB (ref 28–44)
HEIGHTINCHES: 70 in
IVS/LV PW RATIO, ED: 1.07
LA ID, A-P, ES: 48 mm
LA diam end sys: 48 mm
LA diam index: 2.58 cm/m2
LA vol: 136 mL
LAVOLA4C: 127 mL
LAVOLIN: 73.2 mL/m2
LV TDI E'LATERAL: 10.9
LV sys vol: 34 mL (ref 21–61)
LVDIAVOL: 83 mL (ref 62–150)
LVDIAVOLIN: 45 mL/m2
LVEEAVG: 9.12
LVEEMED: 9.12
LVELAT: 10.9 cm/s
LVOT VTI: 18.6 cm
LVOT area: 3.14 cm2
LVOT diameter: 20 mm
LVOT peak VTI: 0.7 cm
LVOTPV: 82 cm/s
LVOTSV: 58 mL
LVSYSVOLIN: 18 mL/m2
MV Dec: 180
MVPG: 4 mmHg
MVPKEVEL: 99.4 m/s
PW: 10.3 mm — AB (ref 0.6–1.1)
RV LATERAL S' VELOCITY: 9.14 cm/s
RV sys press: 35 mmHg
Reg peak vel: 284 cm/s
Simpson's disk: 59
Stroke v: 49 ml
TAPSE: 19.9 mm
TDI e' medial: 8.05
TRMAXVEL: 284 cm/s
VTI: 142 cm
Valve area: 2.19 cm2
WEIGHTICAEL: 2469.15 [oz_av]

## 2015-11-03 MED ORDER — POTASSIUM CHLORIDE CRYS ER 20 MEQ PO TBCR
40.0000 meq | EXTENDED_RELEASE_TABLET | Freq: Two times a day (BID) | ORAL | Status: AC
  Administered 2015-11-03 (×2): 40 meq via ORAL
  Filled 2015-11-03 (×2): qty 2

## 2015-11-03 MED ORDER — FUROSEMIDE 10 MG/ML IJ SOLN
40.0000 mg | Freq: Once | INTRAMUSCULAR | Status: AC
  Administered 2015-11-03: 40 mg via INTRAVENOUS
  Filled 2015-11-03: qty 4

## 2015-11-03 NOTE — Progress Notes (Signed)
PROGRESS NOTE    Zachary Arellano  [id number] DOB: [birth year] DOA: 10/30/2015 PCP: [name], MD  Outpatient Specialists:     Brief Narrative:  53 yom with a hx of afib (CHADsVASc=3), HTN, and glaucoma presented with complaints of hypotension and generalized weakness. While in the ED he was noted to be hypokalemic and hyponatremic. Pt was admitted for severe hyponatremia.   Assessment & Plan:   Active Problems:   Atrial fibrillation (HCC)   Essential hypertension   Hyponatremia   Hypokalemia   Symptomatic bradycardia   General weakness   Rib contusion   1. Hyponatremia. Likely secondary to diuretics and dehydration. For now, continue to hold lisinopril/HTCZ and acetazolamide. Patient was continued on IVF initially with improvement. Sodium later trended down with signs of volume overload and elevated BNP. EF normal. Diastolic function indeterminate. Given one dose of lasix. IVF held. Follow sodium trends 2. Hypokalemia. Replaced potassium. Magnesium normal range.  3. Tachybradycardia syndrome possibly related to electrolyte disturbances. This afternoon, patient noted to have sustained heart rate into the low 40s. Otherwise a symptomatic. Discussed case with cardiology. Initially decreased patient's Cardizem from 240 mg daily to 180 mg daily. Also on clonidine twice a day to 0.1 mg by mouth twice a day. Continued patient's atenolol at 50 mg.  4. Afib CHADsVasc =3 . Anticoagulated on xarelto. HR is currently controlled.  5. Constipation. Started on miralax prn. with no significant result. We'll give a trial of smog enema, as patient reports last bowel movement was approximately 1 week ago. 6. Gait instability. Pt had a fall on 7/15 he is complaining of pain in right hip will follow up Xrays. PT consult requested   7. Essential HTN. Stable.  8.  Suspected Acutely decompensated CHF - per above evidence of volume overload with elevated BNP. Hold further fluids. 2d echo with normal  lvef   DVT prophylaxis: Xarelto  Code Status: Full Family Communication: Family bedside  Disposition Plan: Discharge home once improved    Consultants:   Cardiology   Procedures:   None                   Antimicrobials:   None    Subjective: Feels somewhat better   Objective: Filed Vitals:   11/02/15 2144 11/03/15 0452 11/03/15 0500 11/03/15 1300  BP: 165/114 178/90  107/67  Pulse: 91 96  60  Temp: 97.5 F (36.4 C) 98.1 F (36.7 C)  98 F (36.7 C)  TempSrc: Oral Oral  Oral  Resp: 20 20  20   Height:      Weight:   70 kg (154 lb 5.2 oz)   SpO2: 96% 96%  100%    Intake/Output Summary (Last 24 hours) at 11/03/15 1744 Last data filed at 11/03/15 1200  Gross per 24 hour  Intake    360 ml  Output    700 ml  Net   -340 ml   Filed Weights   10/31/15 0610 11/01/15 0648 11/03/15 0500  Weight: 68.675 kg (151 lb 6.4 oz) 71.169 kg (156 lb 14.4 oz) 70 kg (154 lb 5.2 oz)    Examination:  General exam: Appears calm and comfortable, laying in bed Respiratory system: Clear to auscultation. Respiratory effort normal. Cardiovascular system: S1 & S2 heard, RRR.  Gastrointestinal system: Abdomen is nondistended, soft and nontender. No organomegaly or masses felt. Normal bowel sounds heard. Central nervous system: Alert and oriented. No focal neurological deficits. Extremities:Tenderness to palpation over superior aspect of right hip  Skin: No rashes, lesions  Psychiatry: Judgement and insight appear normal. Mood & affect appropriate.     Data Reviewed: I have personally reviewed following labs and imaging studies  CBC:  Recent Labs Lab 10/30/15 1405 10/31/15 0202  WBC 12.9* 6.9  NEUTROABS 10.3*  --   HGB 13.6 12.4*  HCT 37.4* 33.3*  MCV 91.7 91.2  PLT 198 99991111   Basic Metabolic Panel:  Recent Labs Lab 10/30/15 1405 10/30/15 1939 10/31/15 0202 10/31/15 1707 11/01/15 0538 11/02/15 0447 11/03/15 0551  NA 121*  --  120* 122* 126* 123* 124*  K 2.6*   --  2.5* 4.2 4.4 3.8 3.2*  CL 89*  --  92* 93* 97* 93* 89*  CO2 22  --  22 23 21* 23 26  GLUCOSE 119*  --  102* 110* 111* 111* 107*  BUN 23*  --  18 19 18 14 8   CREATININE 1.15  --  0.79 1.02 0.79 0.65 0.50*  CALCIUM 9.1  --  7.9* 8.3* 8.9 8.3* 8.5*  MG 2.2 2.0  --   --   --   --   --    GFR: Estimated Creatinine Clearance: 70.5 mL/min (by C-G formula based on Cr of 0.5). Liver Function Tests:  Recent Labs Lab 10/31/15 0202  AST 18  ALT 14*  ALKPHOS 36*  BILITOT 1.6*  PROT 5.5*  ALBUMIN 3.6   No results for input(s): LIPASE, AMYLASE in the last 168 hours. No results for input(s): AMMONIA in the last 168 hours. Coagulation Profile: No results for input(s): INR, PROTIME in the last 168 hours. Cardiac Enzymes:  Recent Labs Lab 10/30/15 1405 10/30/15 1939 10/31/15 0202 10/31/15 0525  TROPONINI <0.03 0.03* <0.03 <0.03   BNP (last 3 results) No results for input(s): PROBNP in the last 8760 hours. HbA1C: No results for input(s): HGBA1C in the last 72 hours. CBG: No results for input(s): GLUCAP in the last 168 hours. Lipid Profile: No results for input(s): CHOL, HDL, LDLCALC, TRIG, CHOLHDL, LDLDIRECT in the last 72 hours. Thyroid Function Tests: No results for input(s): TSH, T4TOTAL, FREET4, T3FREE, THYROIDAB in the last 72 hours. Anemia Panel: No results for input(s): VITAMINB12, FOLATE, FERRITIN, TIBC, IRON, RETICCTPCT in the last 72 hours. Urine analysis:    Component Value Date/Time   COLORURINE YELLOW 10/30/2015 1552   APPEARANCEUR CLEAR 10/30/2015 1552   LABSPEC 1.015 10/30/2015 1552   PHURINE 5.5 10/30/2015 1552   GLUCOSEU NEGATIVE 10/30/2015 1552   HGBUR NEGATIVE 10/30/2015 1552   BILIRUBINUR NEGATIVE 10/30/2015 1552   KETONESUR NEGATIVE 10/30/2015 1552   PROTEINUR NEGATIVE 10/30/2015 1552   NITRITE NEGATIVE 10/30/2015 1552   LEUKOCYTESUR NEGATIVE 10/30/2015 1552   Sepsis Labs: @LABRCNTIP (procalcitonin:4,lacticidven:4)  )No results found for this or  any previous visit (from the past 240 hour(s)).       Radiology Studies: Dg Chest Port 1 View  11/02/2015  CLINICAL DATA:  Congestive heart failure. EXAM: PORTABLE CHEST 1 VIEW COMPARISON:  10/30/2015. FINDINGS: Stable enlarged cardiac silhouette. Interval bibasilar airspace opacity. No visible pleural fluid. Unremarkable bones. IMPRESSION: 1. Interval bibasilar pneumonia or alveolar edema. 2. Stable cardiomegaly. Electronically Signed   By: Claudie Revering M.D.   On: 11/02/2015 16:54        Scheduled Meds: . atenolol  50 mg Oral Daily  . cloNIDine  0.1 mg Oral BID  . diltiazem  180 mg Oral Daily  . docusate sodium  100 mg Oral Daily  . dorzolamide-timolol  1 drop Both Eyes Daily  .  fluticasone  1 spray Each Nare Daily  . polyvinyl alcohol  2 drop Both Eyes BID  . potassium chloride  20 mEq Oral Once  . potassium chloride  40 mEq Oral BID  . rivaroxaban  20 mg Oral QPM  . sodium chloride flush  3 mL Intravenous Q12H   Continuous Infusions:     LOS: 4 days     Marylu Lund, MD [facility] Pager [phone]  If 7PM-7AM, please contact night-coverage [email or web address] Password [facility] 11/03/2015, 5:44 PM

## 2015-11-03 NOTE — Care Management Note (Signed)
Case Management Note  Patient Details  Name: JUDSON MAZOR MRN: [id number] Date of Birth: Apr 28, 1931   Expected Discharge Date:  11/02/15               Expected Discharge Plan:  [place]  In-House Referral:  NA  Discharge planning Services  CM Consult  Post Acute Care Choice:  Home Health Choice offered to:  Patient, Spouse  DME Arranged:    DME Agency:     HH Arranged:  RN, PT, Social Work [name]:  [place]  Status of Service:  Completed, signed off  If discussed at [employer], dates discussed:    Additional Comments: Spoke with patient and family, PT to work with patient again. Will follow.   Zakayla Martinec, [name], RN 11/03/2015, 2:35 PM

## 2015-11-03 NOTE — Progress Notes (Signed)
Primary cardiologist: Dr. Lyman Bishop  Seen for followup: Bradycardia and atrial fibrillation  Subjective:    Awake this morning. States he feels "pretty good." No chest pain or palpitations.  Objective:   Temp:  [97.5 F (36.4 C)-98.1 F (36.7 C)] 98.1 F (36.7 C) (07/21 0452) Pulse Rate:  [81-96] 96 (07/21 0452) Resp:  [20] 20 (07/21 0452) BP: (131-178)/(69-114) 178/90 mmHg (07/21 0452) SpO2:  [96 %-98 %] 96 % (07/21 0452) Weight:  [154 lb 5.2 oz (70 kg)] 154 lb 5.2 oz (70 kg) (07/21 0500) Last BM Date: 10/26/15  Filed Weights   10/31/15 0610 11/01/15 0648 11/03/15 0500  Weight: 151 lb 6.4 oz (68.675 kg) 156 lb 14.4 oz (71.169 kg) 154 lb 5.2 oz (70 kg)    Intake/Output Summary (Last 24 hours) at 11/03/15 0820 Last data filed at 11/02/15 1700  Gross per 24 hour  Intake    600 ml  Output   1350 ml  Net   -750 ml    Telemetry: Atrial fibrillation with fairly persistent RVR, no pauses.  Exam:  General: Elderly male in no distress.  Lungs: Clear, nonlabored.  Cardiac: Irregularly irregular, no gallop.  Extremities: No pitting edema.  Lab Results:  Basic Metabolic Panel:  Recent Labs Lab 10/30/15 1405 10/30/15 1939  11/01/15 0538 11/02/15 0447 11/03/15 0551  NA 121*  --   < > 126* 123* 124*  K 2.6*  --   < > 4.4 3.8 3.2*  CL 89*  --   < > 97* 93* 89*  CO2 22  --   < > 21* 23 26  GLUCOSE 119*  --   < > 111* 111* 107*  BUN 23*  --   < > 18 14 8   CREATININE 1.15  --   < > 0.79 0.65 0.50*  CALCIUM 9.1  --   < > 8.9 8.3* 8.5*  MG 2.2 2.0  --   --   --   --   < > = values in this interval not displayed.   CBC:  Recent Labs Lab 10/30/15 1405 10/31/15 0202  WBC 12.9* 6.9  HGB 13.6 12.4*  HCT 37.4* 33.3*  MCV 91.7 91.2  PLT 198 180    Cardiac Enzymes:  Recent Labs Lab 10/30/15 1939 10/31/15 0202 10/31/15 0525  TROPONINI 0.03* <0.03 <0.03    Chest x-ray 11/02/2015: FINDINGS: Stable enlarged cardiac silhouette. Interval bibasilar  airspace opacity. No visible pleural fluid. Unremarkable bones.  IMPRESSION: 1. Interval bibasilar pneumonia or alveolar edema. 2. Stable cardiomegaly.   Medications:   Scheduled Medications: . atenolol  50 mg Oral Daily  . cloNIDine  0.1 mg Oral BID  . diltiazem  180 mg Oral Daily  . docusate sodium  100 mg Oral Daily  . dorzolamide-timolol  1 drop Both Eyes Daily  . fluticasone  1 spray Each Nare Daily  . polyvinyl alcohol  2 drop Both Eyes BID  . potassium chloride  20 mEq Oral Once  . rivaroxaban  20 mg Oral QPM  . sodium chloride flush  3 mL Intravenous Q12H    PRN Medications: acetaminophen **OR** acetaminophen, benzonatate, mineral oil, polyethylene glycol   Assessment:   1. 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 Current medications include atenolol at 50 mg daily, Cardizem CD which is to be increased to 180 mg daily. Bradycardia has improved, however he now has more sustained elevation in heart rate.  2. Essential hypertension, clonidine dose cut back per primary team. May  need to consider another agent such as ARB if trend remains up.  3. Hyponatremia and hypokalemia, management per primary team. Diamox (treatment of glaucoma) was stopped. Question now of component of volume overload with recent hydration. CXR shows cardiomegaly and possible edema. Echocardiogram ordered.   Plan/Discussion:    Continue Xarelto for stroke prophylaxis. See how heart rate responds on Cardizem CD to 180 mg daily and continue atenolol. Clonidine is being weaned. May need additional medication adjustments for blood pressure control depending on trend, possibly consider an ARB. Will followup on echocardiogram.   Satira Sark, M.D., F.A.C.C.

## 2015-11-03 NOTE — Progress Notes (Signed)
*  PRELIMINARY RESULTS* Echocardiogram 2D Echocardiogram has been performed.  Zachary Arellano 11/03/2015, 3:59 PM

## 2015-11-03 NOTE — Care Management Important Message (Signed)
Important Message  Patient Details  Name: Zachary Arellano MRN: [id number] Date of Birth: [birth year]   Medicare Important Message Given:  Yes    Kynsleigh Westendorf, [name], RN 11/03/2015, 10:25 AM

## 2015-11-04 LAB — BASIC METABOLIC PANEL
ANION GAP: 8 (ref 5–15)
BUN: 13 mg/dL (ref 6–20)
CHLORIDE: 90 mmol/L — AB (ref 101–111)
CO2: 27 mmol/L (ref 22–32)
Calcium: 8.5 mg/dL — ABNORMAL LOW (ref 8.9–10.3)
Creatinine, Ser: 0.56 mg/dL — ABNORMAL LOW (ref 0.61–1.24)
GFR calc Af Amer: >60 mL/min (ref >60)
GFR calc non Af Amer: >60 mL/min (ref >60)
GLUCOSE: 105 mg/dL — AB (ref 65–99)
POTASSIUM: 3.9 mmol/L (ref 3.5–5.1)
Sodium: 125 mmol/L — ABNORMAL LOW (ref 135–145)

## 2015-11-04 NOTE — Progress Notes (Signed)
PROGRESS NOTE    Zachary Arellano  [id number] DOB: Dec 07, 1931 DOA: 10/30/2015 PCP: [name], MD  Outpatient Specialists:     Brief Narrative:  51 yom with a hx of afib (CHADsVASc=3), HTN, and glaucoma presented with complaints of hypotension and generalized weakness. While in the ED he was noted to be hypokalemic and hyponatremic. Pt was admitted for severe hyponatremia.   Assessment & Plan:   Active Problems:   Atrial fibrillation (HCC)   Essential hypertension   Hyponatremia   Hypokalemia   Symptomatic bradycardia   General weakness   Rib contusion   1. Hyponatremia. Likely secondary to diuretics and dehydration. For now, continue to hold lisinopril/HTCZ and acetazolamide. Patient was continued on IVF initially with improvement. Sodium later trended down with signs of volume overload and elevated BNP. EF normal. Diastolic function indeterminate. Given one dose of lasix. IVF held. Sodium slowly trending up. 125 today 2. Hypokalemia. Replaced potassium. Magnesium normal range.  3. Tachybradycardia syndrome possibly related to electrolyte disturbances. HR noted to be labile. Patient's Cardizem was decreased from 240 mg daily to 180 mg daily. Also on clonidine twice a day to 0.1 mg by mouth twice a day. Continued patient's atenolol at 50 mg. HR stable at present 4. Afib CHADsVasc =3 . Anticoagulated on xarelto. HR is currently controlled.  5. Constipation. Started on miralax prn. with no significant result. Given a trial of smog enema, as patient reports last bowel movement was approximately 1 week ago with good results 6. Gait instability. Pt had a fall on 7/15. Hip xrays without fx reported. Instead DJD noted. PT consult requested   7. Essential HTN. Stable.  8.  Suspected Acutely decompensated CHF - per above evidence of volume overload with elevated BNP. Hold further fluids. 2d echo with normal lvef   DVT prophylaxis: Xarelto  Code Status: Full Family Communication: Family  bedside  Disposition Plan: Discharge home once improved    Consultants:   Cardiology   Procedures:   None                   Antimicrobials:   None    Subjective: Reports feeling better today.   Objective: Filed Vitals:   11/03/15 1300 11/03/15 2211 11/04/15 0440 11/04/15 1556  BP: 107/67 146/88 150/98 129/79  Pulse: 60 75 93 78  Temp: 98 F (36.7 C) 98.1 F (36.7 C) 98.3 F (36.8 C) 98.1 F (36.7 C)  TempSrc: Oral Oral Oral Oral  Resp: 20 20 20 20   Height:      Weight:   72.122 kg (159 lb)   SpO2: 100% 100% 100% 97%    Intake/Output Summary (Last 24 hours) at 11/04/15 1714 Last data filed at 11/04/15 1300  Gross per 24 hour  Intake    480 ml  Output   2051 ml  Net  -1571 ml   Filed Weights   11/01/15 0648 11/03/15 0500 11/04/15 0440  Weight: 71.169 kg (156 lb 14.4 oz) 70 kg (154 lb 5.2 oz) 72.122 kg (159 lb)    Examination:  General exam: Appears calm and comfortable, laying in bed, Eating Respiratory system: Clear to auscultation. Respiratory effort normal. Cardiovascular system: S1 & S2 heard, RRR.  Gastrointestinal system: Abdomen is nondistended, soft and nontender. No organomegaly or masses felt. Normal bowel sounds heard. Central nervous system: Alert and oriented. No focal neurological deficits. Extremities:Tenderness to palpation over superior aspect of right hip  Skin: No rashes, lesions  Psychiatry: Judgement and insight appear normal. Mood &  affect appropriate.     Data Reviewed: I have personally reviewed following labs and imaging studies  CBC:  Recent Labs Lab 10/30/15 1405 10/31/15 0202  WBC 12.9* 6.9  NEUTROABS 10.3*  --   HGB 13.6 12.4*  HCT 37.4* 33.3*  MCV 91.7 91.2  PLT 198 99991111   Basic Metabolic Panel:  Recent Labs Lab 10/30/15 1405 10/30/15 1939  10/31/15 1707 11/01/15 0538 11/02/15 0447 11/03/15 0551 11/04/15 0515  NA 121*  --   < > 122* 126* 123* 124* 125*  K 2.6*  --   < > 4.2 4.4 3.8 3.2* 3.9  CL 89*   --   < > 93* 97* 93* 89* 90*  CO2 22  --   < > 23 21* 23 26 27   GLUCOSE 119*  --   < > 110* 111* 111* 107* 105*  BUN 23*  --   < > 19 18 14 8 13   CREATININE 1.15  --   < > 1.02 0.79 0.65 0.50* 0.56*  CALCIUM 9.1  --   < > 8.3* 8.9 8.3* 8.5* 8.5*  MG 2.2 2.0  --   --   --   --   --   --   < > = values in this interval not displayed. GFR: Estimated Creatinine Clearance: 72.6 mL/min (by C-G formula based on Cr of 0.56). Liver Function Tests:  Recent Labs Lab 10/31/15 0202  AST 18  ALT 14*  ALKPHOS 36*  BILITOT 1.6*  PROT 5.5*  ALBUMIN 3.6   No results for input(s): LIPASE, AMYLASE in the last 168 hours. No results for input(s): AMMONIA in the last 168 hours. Coagulation Profile: No results for input(s): INR, PROTIME in the last 168 hours. Cardiac Enzymes:  Recent Labs Lab 10/30/15 1405 10/30/15 1939 10/31/15 0202 10/31/15 0525  TROPONINI <0.03 0.03* <0.03 <0.03   BNP (last 3 results) No results for input(s): PROBNP in the last 8760 hours. HbA1C: No results for input(s): HGBA1C in the last 72 hours. CBG: No results for input(s): GLUCAP in the last 168 hours. Lipid Profile: No results for input(s): CHOL, HDL, LDLCALC, TRIG, CHOLHDL, LDLDIRECT in the last 72 hours. Thyroid Function Tests: No results for input(s): TSH, T4TOTAL, FREET4, T3FREE, THYROIDAB in the last 72 hours. Anemia Panel: No results for input(s): VITAMINB12, FOLATE, FERRITIN, TIBC, IRON, RETICCTPCT in the last 72 hours. Urine analysis:    Component Value Date/Time   COLORURINE YELLOW 10/30/2015 1552   APPEARANCEUR CLEAR 10/30/2015 1552   LABSPEC 1.015 10/30/2015 1552   PHURINE 5.5 10/30/2015 1552   GLUCOSEU NEGATIVE 10/30/2015 1552   HGBUR NEGATIVE 10/30/2015 1552   BILIRUBINUR NEGATIVE 10/30/2015 1552   KETONESUR NEGATIVE 10/30/2015 1552   PROTEINUR NEGATIVE 10/30/2015 1552   NITRITE NEGATIVE 10/30/2015 1552   LEUKOCYTESUR NEGATIVE 10/30/2015 1552   Sepsis  Labs: @LABRCNTIP (procalcitonin:4,lacticidven:4)  )No results found for this or any previous visit (from the past 240 hour(s)).       Radiology Studies: No results found.      Scheduled Meds: . atenolol  50 mg Oral Daily  . cloNIDine  0.1 mg Oral BID  . diltiazem  180 mg Oral Daily  . docusate sodium  100 mg Oral Daily  . dorzolamide-timolol  1 drop Both Eyes Daily  . fluticasone  1 spray Each Nare Daily  . polyvinyl alcohol  2 drop Both Eyes BID  . potassium chloride  20 mEq Oral Once  . rivaroxaban  20 mg Oral QPM  .  sodium chloride flush  3 mL Intravenous Q12H   Continuous Infusions:     LOS: 5 days     Marylu Lund, MD [facility] Pager [phone]  If 7PM-7AM, please contact night-coverage [email or web address] Password [facility] 11/04/2015, 5:14 PM

## 2015-11-05 LAB — BASIC METABOLIC PANEL
ANION GAP: 7 (ref 5–15)
BUN: 16 mg/dL (ref 6–20)
CALCIUM: 8.5 mg/dL — AB (ref 8.9–10.3)
CO2: 29 mmol/L (ref 22–32)
CREATININE: 0.58 mg/dL — AB (ref 0.61–1.24)
Chloride: 91 mmol/L — ABNORMAL LOW (ref 101–111)
Glucose, Bld: 108 mg/dL — ABNORMAL HIGH (ref 65–99)
Potassium: 3.7 mmol/L (ref 3.5–5.1)
SODIUM: 127 mmol/L — AB (ref 135–145)

## 2015-11-05 NOTE — Progress Notes (Signed)
PROGRESS NOTE    Zachary Arellano  [id number] DOB: November 27, 1931 DOA: 10/30/2015 PCP: [name], MD  Outpatient Specialists:     Brief Narrative:  43 yom with a hx of afib (CHADsVASc=3), HTN, and glaucoma presented with complaints of hypotension and generalized weakness. While in the ED he was noted to be hypokalemic and hyponatremic. Pt was admitted for severe hyponatremia.   Assessment & Plan:   Active Problems:   Atrial fibrillation (HCC)   Essential hypertension   Hyponatremia   Hypokalemia   Symptomatic bradycardia   General weakness   Rib contusion   1. Hyponatremia. Likely secondary to diuretics and dehydration. For now, continue to hold lisinopril/HTCZ and acetazolamide. Patient was continued on IVF initially with improvement. Sodium later trended down with signs of volume overload and elevated BNP. EF normal. Diastolic function indeterminate. Patient was given one dose of lasix. IVF held. Sodium continued to trend up. Sodium today is 127. 2. Hypokalemia. Replaced potassium. Magnesium normal range.  3. Tachybradycardia syndrome possibly related to electrolyte disturbances. HR noted to be labile. Patient's Cardizem was decreased from 240 mg daily to 180 mg daily. Also on clonidine twice a day to 0.1 mg by mouth twice a day. Continued patient's atenolol at 50 mg. Present, patient's heart rate appears stable. 4. Afib CHADsVasc =3 . Anticoagulated on xarelto. HR is currently controlled.  5. Constipation. Patient was initially started on miralax prn. with no significant result. Patient was later given a trial of smog enema, as patient reports last bowel movement was approximately 1 week ago with good results 6. Gait instability. Pt had a fall on 7/15. Hip xrays without fx reported. Instead DJD noted. PT consult requested. Recommendations for home health PT. Family is concerned that the patient seems more debilitated at this time. Have re-consulted physical therapy for reevaluation for  disposition.  7. Essential HTN. Stable.  8.  Suspected Acutely decompensated CHF - per above, patient had evidence of volume overload with elevated BNP. Hold further fluids. 2d echo with normal lvef, diastolic function indeterminate   DVT prophylaxis: Xarelto  Code Status: Full Family Communication: Family bedside  Disposition Plan: Discharge home once improved    Consultants:   Cardiology   Procedures:   None                   Antimicrobials:   None    Subjective: Patient reports feeling well today. He is questioning about going home soon.   Objective: Vitals:   11/04/15 1556 11/04/15 2105 11/05/15 0533 11/05/15 1410  BP: 129/79 137/74 (!) 168/73 103/61  Pulse: 78 68 95 (!) 50  Resp: 20 20 20 20   Temp: 98.1 F (36.7 C) 98 F (36.7 C) 98.2 F (36.8 C) 97.8 F (36.6 C)  TempSrc: Oral Oral Oral Oral  SpO2: 97% 98% 97% 99%  Weight:   72 kg (158 lb 11.2 oz)   Height:        Intake/Output Summary (Last 24 hours) at 11/05/15 1533 Last data filed at 11/05/15 1325  Gross per 24 hour  Intake              360 ml  Output              702 ml  Net             -342 ml   Filed Weights   11/03/15 0500 11/04/15 0440 11/05/15 0533  Weight: 70 kg (154 lb 5.2 oz) 72.1 kg (159 lb) 72  kg (158 lb 11.2 oz)    Examination:  General exam: Appears calm and comfortable, Sitting in bed., Conversant Respiratory system: Clear to auscultation. Respiratory effort normal. Cardiovascular system: S1 & S2 heard, RRR.  Gastrointestinal system: Abdomen is nondistended, soft and nontender. No organomegaly or masses felt. Normal bowel sounds heard. Central nervous system: Alert and oriented. No focal neurological deficits. Extremities:Tenderness to palpation over superior aspect of right hip  Skin: No rashes, lesions  Psychiatry: Judgement and insight appear normal. Mood & affect appropriate.     Data Reviewed: I have personally reviewed following labs and imaging  studies  CBC:  Recent Labs Lab 10/30/15 1405 10/31/15 0202  WBC 12.9* 6.9  NEUTROABS 10.3*  --   HGB 13.6 12.4*  HCT 37.4* 33.3*  MCV 91.7 91.2  PLT 198 [id number]   Basic Metabolic Panel:  Recent Labs Lab 10/30/15 1405 10/30/15 1939  11/01/15 0538 11/02/15 0447 11/03/15 0551 11/04/15 0515 11/05/15 0530  NA 121*  --   < > 126* 123* 124* 125* 127*  K 2.6*  --   < > 4.4 3.8 3.2* 3.9 3.7  CL 89*  --   < > 97* 93* 89* 90* 91*  CO2 22  --   < > 21* 23 26 27 29   GLUCOSE 119*  --   < > 111* 111* 107* 105* 108*  BUN 23*  --   < > 18 14 8 13 16   CREATININE 1.15  --   < > 0.79 0.65 0.50* 0.56* 0.58*  CALCIUM 9.1  --   < > 8.9 8.3* 8.5* 8.5* 8.5*  MG 2.2 2.0  --   --   --   --   --   --   < > = values in this interval not displayed. GFR: Estimated Creatinine Clearance: 72.5 mL/min (by C-G formula based on SCr of 0.8 mg/dL). Liver Function Tests:  Recent Labs Lab 10/31/15 0202  AST 18  ALT 14*  ALKPHOS 36*  BILITOT 1.6*  PROT 5.5*  ALBUMIN 3.6   No results for input(s): LIPASE, AMYLASE in the last 168 hours. No results for input(s): AMMONIA in the last 168 hours. Coagulation Profile: No results for input(s): INR, PROTIME in the last 168 hours. Cardiac Enzymes:  Recent Labs Lab 10/30/15 1405 10/30/15 1939 10/31/15 0202 10/31/15 0525  TROPONINI <0.03 0.03* <0.03 <0.03   BNP (last 3 results) No results for input(s): PROBNP in the last 8760 hours. HbA1C: No results for input(s): HGBA1C in the last 72 hours. CBG: No results for input(s): GLUCAP in the last 168 hours. Lipid Profile: No results for input(s): CHOL, HDL, LDLCALC, TRIG, CHOLHDL, LDLDIRECT in the last 72 hours. Thyroid Function Tests: No results for input(s): TSH, T4TOTAL, FREET4, T3FREE, THYROIDAB in the last 72 hours. Anemia Panel: No results for input(s): VITAMINB12, FOLATE, FERRITIN, TIBC, IRON, RETICCTPCT in the last 72 hours. Urine analysis:    Component Value Date/Time   COLORURINE YELLOW  10/30/2015 1552   APPEARANCEUR CLEAR 10/30/2015 1552   LABSPEC 1.015 10/30/2015 1552   PHURINE 5.5 10/30/2015 1552   GLUCOSEU NEGATIVE 10/30/2015 1552   HGBUR NEGATIVE 10/30/2015 1552   BILIRUBINUR NEGATIVE 10/30/2015 1552   KETONESUR NEGATIVE 10/30/2015 1552   PROTEINUR NEGATIVE 10/30/2015 1552   NITRITE NEGATIVE 10/30/2015 1552   LEUKOCYTESUR NEGATIVE 10/30/2015 1552   Sepsis Labs: @LABRCNTIP (procalcitonin:4,lacticidven:4)  )No results found for this or any previous visit (from the past 240 hour(s)).       Radiology Studies: No results  found.      Scheduled Meds: . atenolol  50 mg Oral Daily  . cloNIDine  0.1 mg Oral BID  . diltiazem  180 mg Oral Daily  . docusate sodium  100 mg Oral Daily  . dorzolamide-timolol  1 drop Both Eyes Daily  . fluticasone  1 spray Each Nare Daily  . polyvinyl alcohol  2 drop Both Eyes BID  . potassium chloride  20 mEq Oral Once  . rivaroxaban  20 mg Oral QPM  . sodium chloride flush  3 mL Intravenous Q12H   Continuous Infusions:     LOS: 6 days     Marylu Lund, MD [facility] Pager [phone]  If 7PM-7AM, please contact night-coverage [email or web address] Password [email or web address] 11/05/2015, 3:33 PM

## 2015-11-06 ENCOUNTER — Encounter (HOSPITAL_COMMUNITY): Payer: Self-pay

## 2015-11-06 DIAGNOSIS — I495 Sick sinus syndrome: Secondary | ICD-10-CM

## 2015-11-06 DIAGNOSIS — I5031 Acute diastolic (congestive) heart failure: Secondary | ICD-10-CM

## 2015-11-06 LAB — BASIC METABOLIC PANEL
ANION GAP: 8 (ref 5–15)
BUN: 15 mg/dL (ref 6–20)
CALCIUM: 8.7 mg/dL — AB (ref 8.9–10.3)
CO2: 30 mmol/L (ref 22–32)
Chloride: 93 mmol/L — ABNORMAL LOW (ref 101–111)
Creatinine, Ser: 0.72 mg/dL (ref 0.61–1.24)
Glucose, Bld: 103 mg/dL — ABNORMAL HIGH (ref 65–99)
POTASSIUM: 3.8 mmol/L (ref 3.5–5.1)
Sodium: 131 mmol/L — ABNORMAL LOW (ref 135–145)

## 2015-11-06 MED ORDER — CLONIDINE HCL 0.1 MG PO TABS: 0.1000 mg | ORAL_TABLET | Freq: Two times a day (BID) | ORAL | 0 refills | Status: DC

## 2015-11-06 MED ORDER — DOCUSATE SODIUM 100 MG PO CAPS: 100.0000 mg | ORAL_CAPSULE | Freq: Every day | ORAL | 0 refills | Status: AC

## 2015-11-06 MED ORDER — DILTIAZEM HCL ER COATED BEADS 180 MG PO CP24: 180.0000 mg | ORAL_CAPSULE | Freq: Every day | ORAL | 0 refills | Status: DC

## 2015-11-06 MED ORDER — ATENOLOL 50 MG PO TABS: 50.0000 mg | ORAL_TABLET | Freq: Every day | ORAL | 0 refills | Status: DC

## 2015-11-06 NOTE — Progress Notes (Signed)
Discharge instructions and medications reviewed w/pt and his two sons.  All three verbalize understanding and have no questions at this time.

## 2015-11-06 NOTE — Progress Notes (Signed)
Physical Therapy Treatment Patient Details Name: Zachary Arellano MRN: [id number] DOB: 28-May-1931 Today's Date: 11/06/2015    History of Present Illness 82 yo M admitted 10/30/2015 s/p fall on Sunday and bruised some ribs on the right. Pt found to have hypotension & mild confusion. Dx: dehydration due to recent trial of Diamox for glaucoma. Severe hyponatremia and bradycardia. Pt also with some newly diagnosed Acutely decompensated CHF.  10/31/2015 XR of pelvis impression: Osteoporosis. Osteoarthritic change in the lower lumbar spine and both hip regions. Probable calcific tendinosis lateral right proximal femur region  adjacent to the greater trochanter. No acute fracture or dislocation. There is what is felt to represent a Mach band in the proximal right femur, seen to extend beyond the confines of the bone. Atherosclerotic calcification noted at several sites.  PMH: glaucoma, HTN, chronic Afib, CVA, rib fx.    PT Comments    Pt received in bed, son present, and pt is agreeable to PT tx.  Pt demonstrates all functional mobility at modified independent level including bed mobility, sit<>stand transfers, Ambulation x 561ft with RW, and 10 steps.  PT was asked to re-evaluate this pt due to family concerns for mobility and possible need for placement, however he continues to demonstrate improvements with mobility.  Discussed continued recommendations for HHPT with the family (2 sons and dtr), who were all agreeable to this.  Encouraged family to walk with the pt in the halls with the RW.  Continue to recommend HHPT at d/c.    Follow Up Recommendations  Home health PT;Supervision/Assistance - 24 hour     Equipment Recommendations  None recommended by PT    Recommendations for Other Services       Precautions / Restrictions Precautions Precautions: Fall Precaution Comments: Reason for admission.  Restrictions Weight Bearing Restrictions: No    Mobility  Bed Mobility Overal bed mobility:  Modified Independent Bed Mobility: Sidelying to Sit   Sidelying to sit: Modified independent (Device/Increase time)          Transfers Overall transfer level: Modified independent Equipment used: Rolling walker (2 wheeled) Transfers: Sit to/from Stand           General transfer comment: Continued vc's for hand placement.  Ambulation/Gait Ambulation/Gait assistance: Modified independent (Device/Increase time) Ambulation Distance (Feet): 550 Feet Assistive device: Rolling walker (2 wheeled) Gait Pattern/deviations: Step-through pattern         Stairs Stairs: Yes Stairs assistance: Modified independent (Device/Increase time) Stair Management: Two rails;Alternating pattern;Step to pattern (Alternating going up, and step to going down. ) Number of Stairs: 10    Wheelchair Mobility    Modified Rankin (Stroke Patients Only)       Balance                                    Cognition Arousal/Alertness: Awake/alert Behavior During Therapy: WFL for tasks assessed/performed Overall Cognitive Status: Within Functional Limits for tasks assessed                      Exercises      General Comments        Pertinent Vitals/Pain Pain Assessment: No/denies pain    Home Living                      Prior Function            PT Goals (current goals  can now be found in the care plan section) Acute Rehab PT Goals Patient Stated Goal: Pt wants to get strong enough to go back home. PT Goal Formulation: With patient/family Time For Goal Achievement: 11/07/15 Potential to Achieve Goals: Good Progress towards PT goals: Progressing toward goals    Frequency  Min 3X/week    PT Plan Frequency needs to be updated    Co-evaluation             End of Session Equipment Utilized During Treatment: Gait belt Activity Tolerance: Patient tolerated treatment well Patient left: in chair;with call bell/phone within reach;with  family/visitor present     Time: [id number] PT Time Calculation (min) (ACUTE ONLY): 17 min  Charges:  $Gait Training: 8-22 mins                    G Codes:      Beth Gilmore List, PT, DPT X: [phone]

## 2015-11-06 NOTE — Care Management Note (Addendum)
Case Management Note  Patient Details  Name: Zachary Arellano MRN: [id number] Date of Birth: [birth year]   Expected Discharge Date:  11/02/15               Expected Discharge Plan:  [place]  In-House Referral:  NA  Discharge planning Services  CM Consult  Post Acute Care Choice:  Home Health Choice offered to:  Patient, Spouse  DME Arranged:    DME Agency:     HH Arranged:  RN, PT, Social Work [name]:  [place]  Status of Service:  Completed, signed off  If discussed at [employer], dates discussed:    Additional Comments:  Patient wanting to go home, worked with PT this morning, still recommending HHPT and 24 hour supervision. Will continue with [facility] as previously discussed. Family at bedside and agreeable to plan. Provided patient and family with list of private duty agencies for help with 24 hour supervision. Alma Mohiuddin, [name], RN 11/06/2015, 11:23 AM

## 2015-11-06 NOTE — Discharge Summary (Signed)
Physician Discharge Summary  Zachary Arellano [id number] DOB: 1931-08-02 DOA: 10/30/2015  PCP: [name], MD  Admit date: 10/30/2015 Discharge date: 11/06/2015  Admitted From: Home Disposition:  Home  Recommendations for Outpatient Follow-up:  1. Follow up with PCP in 1-2 weeks 2. Follow up with Dr. Debara Pickett at next available appointment 3. Please obtain BMP in one week 4. Please follow up on the following pending results:  Home Health:yes     Discharge Condition:Improved CODE STATUS:Full Diet recommendation: Heart healthy   Brief/Interim Summary: 25 yom with a hx of afib (CHADsVASc=3), HTN, and glaucoma presented with complaints of hypotension and generalized weakness. While in the ED he was noted to be hypokalemic and hyponatremic. Pt was admitted for severe hyponatremia.   1. Hyponatremia. Likely secondary to diuretics and dehydration. Held lisinopril/HTCZ and acetazolamide. Patient was continued on IVF initially with improvement. Sodium later trended down with signs of volume overload and elevated BNP. EF normal. Diastolic function was indeterminate. Patient was given one dose of lasix. IVF held. Sodium gradually improved to 131. 2. Hypokalemia. Replaced potassium. Magnesium normal range.  3. Tachybradycardia syndrome possibly related to electrolyte disturbances. HR noted to be labile. Patient's Cardizem was decreased from 240 mg daily to 180 mg daily. Also decreased clonidine to 0.1 mg by mouth twice a day. Continued patient's atenolol at 50 mg. Present, patient's heart rate appears stable. Cardiology recommends close outpatient follow up with pt's primary cardiologist, Dr. Debara Pickett. 4. Afib CHADsVasc =3 . Anticoagulated on xarelto. HR is currently controlled.  5. Constipation. Patient was initially started on miralax prn. with no significant result. Patient was later given a trial of smog enema, as patient reports last bowel movement was approximately 1 week ago with good results 6. Gait  instability. Pt had a fall on 7/15. Hip xrays without fx reported. Instead DJD noted. PT consult requested. Recommendations for home health PT.  7. Essential HTN. Stable.  8.  Suspected Acutely decompensated CHF - per above, patient had evidence of volume overload with elevated BNP. Held further fluids. 2d echo with normal lvef, diastolic function indeterminate  Discharge Diagnoses:  Active Problems:   Atrial fibrillation (HCC)   Essential hypertension   Hyponatremia   Hypokalemia   Symptomatic bradycardia   General weakness   Rib contusion      Medication List    STOP taking these medications   acetaZOLAMIDE 250 MG tablet Commonly known as:  DIAMOX   aspirin EC 81 MG tablet   lisinopril-hydrochlorothiazide 20-25 MG tablet Commonly known as:  PRINZIDE,ZESTORETIC     TAKE these medications   atenolol 50 MG tablet Commonly known as:  TENORMIN Take 1 tablet (50 mg total) by mouth daily. What changed:  See the new instructions.   B-complex with vitamin C tablet Take 1 tablet by mouth daily.   cloNIDine 0.1 MG tablet Commonly known as:  CATAPRES Take 1 tablet (0.1 mg total) by mouth 2 (two) times daily. What changed:  See the new instructions.   diltiazem 180 MG 24 hr capsule Commonly known as:  CARDIZEM CD Take 1 capsule (180 mg total) by mouth daily. What changed:  See the new instructions.   docusate sodium 100 MG capsule Commonly known as:  COLACE Take 1 capsule (100 mg total) by mouth daily.   dorzolamide-timolol 22.3-6.8 MG/ML ophthalmic solution Commonly known as:  COSOPT 1 drop daily.   multivitamin with minerals Tabs tablet Take 1 tablet by mouth daily.   SYSTANE 0.4-0.3 % Soln Generic drug:  Polyethyl Glycol-Propyl Glycol Apply 2 drops to eye 2 (two) times daily.   VISION FORMULA EYE HEALTH Caps Take 1 capsule by mouth daily.   XARELTO 20 MG Tabs tablet Generic drug:  rivaroxaban TAKE ONE (1) TABLET EACH DAY      Follow-up Information     [name], MD. Go on 11/22/2015.   Specialty:  Internal Medicine Why:  At 3pm Contact information: [place] [address] [phone]        Pixie Casino, MD. Schedule an appointment as soon as possible for a visit today.   Specialty:  Cardiology Why:  at next available appointment for close follow up Contact information: [place] [address] [phone]          No Known Allergies  Consultations:  Cardiology  Procedures/Studies: Dg Chest 2 View  Result Date: 10/16/2015 CLINICAL DATA:  Cough and dizziness since May, 2017 after a fall. EXAM: CHEST  2 VIEW COMPARISON:  Single-view of the chest 09/08/2006. FINDINGS: There is cardiomegaly without edema. Lungs are clear. No pneumothorax or pleural effusion. Aortic atherosclerosis is noted. No focal bony abnormality. IMPRESSION: Cardiomegaly without acute disease. Electronically Signed   By: Inge Rise M.D.   On: 10/16/2015 14:41   Dg Ribs Unilateral W/chest Right  Result Date: 10/30/2015 CLINICAL DATA:  Status post fall yesterday with a right chest injury. Pain. EXAM: RIGHT RIBS AND CHEST - 3+ VIEW COMPARISON:  PA and lateral chest 10/16/2015. Single view of the chest 09/08/2006. FINDINGS: The lungs are clear. No pneumothorax or pleural effusion. Heart size is upper normal. Aortic atherosclerosis is noted. Remote healed right sixth and seventh rib fractures are unchanged. No acute fracture is identified. IMPRESSION: No acute abnormality. Remote healed right sixth and seventh rib fractures. Atherosclerosis. Electronically Signed   By: Inge Rise M.D.   On: 10/30/2015 16:35   Ct Head Wo Contrast  Result Date: 10/30/2015 CLINICAL DATA:  Confusion EXAM: CT HEAD WITHOUT CONTRAST TECHNIQUE: Contiguous axial images were obtained from the base of the skull through the vertex without intravenous contrast. COMPARISON:  10/16/2015 FINDINGS: No skull fracture is noted. Paranasal  sinuses and mastoid air cells are unremarkable. No intracranial hemorrhage, mass effect or midline shift. Stable atrophy.  Ventricular size is stable from prior exam. No acute cortical infarction. No mass lesion is noted on this unenhanced scan. 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 IMPRESSION: No acute intracranial abnormality. No definite acute cortical infarction. Stable cerebral atrophy. Again noted old infarct in left cerebellum. Electronically Signed   By: Lahoma Crocker M.D.   On: 10/30/2015 16:42   Ct Head Wo Contrast  Result Date: 10/16/2015 CLINICAL DATA:  Dizziness, head injury 5/ 26/17 EXAM: CT HEAD WITHOUT CONTRAST TECHNIQUE: Contiguous axial images were obtained from the base of the skull through the vertex without intravenous contrast. COMPARISON:  09/08/2015 FINDINGS: No skull fracture is noted. No intracranial hemorrhage, mass effect or midline shift. No acute cortical infarction. Paranasal sinuses and mastoid air cells are unremarkable. Stable cerebral atrophy. Stable old infarct in left cerebellum. No mass lesion is noted on this unenhanced scan. Ventricular size is stable from prior exam. IMPRESSION: No acute intracranial abnormality. Stable cerebral atrophy. Ventricular size is stable from prior exam. Old infarct in left cerebellum again noted. No acute cortical infarction. Electronically Signed   By: Lahoma Crocker M.D.   On: 10/16/2015 14:51   Mr Brain Wo Contrast  Result Date: 10/16/2015 CLINICAL DATA:  Altered  mental status and weakness for 2 days. History of prior stroke. Chronic atrial fibrillation. Hypertension. EXAM: MRI HEAD WITHOUT CONTRAST TECHNIQUE: Multiplanar, multiecho pulse sequences of the brain and surrounding structures were obtained without intravenous contrast. COMPARISON:  CT head most recent 10/16/2015 also 09/08/2015. FINDINGS: No evidence for acute infarction, hemorrhage, mass lesion, or extra-axial fluid. Global atrophy. Hydrocephalus ex vacuo. Moderately extensive  T2 and FLAIR hyperintensities throughout the white matter, likely chronic microvascular ischemic change. Chronic LEFT cerebellar infarct. Partial empty sella. No tonsillar herniation. Cervical spondylosis. Flow voids are maintained, specifically the LEFT vertebral appears patent. No significant foci of chronic hemorrhage either in the cerebral hemispheres or related to the LEFT cerebellar infarct. Extracranial soft tissues appear unremarkable. Artifactual signal loss related to dental work on the RIGHT. IMPRESSION: Atrophy and small vessel disease. Old LEFT cerebellar infarct. No acute intracranial findings. Electronically Signed   By: Staci Righter M.D.   On: 10/16/2015 16:56   Dg Chest Port 1 View  Result Date: 11/02/2015 CLINICAL DATA:  Congestive heart failure. EXAM: PORTABLE CHEST 1 VIEW COMPARISON:  10/30/2015. FINDINGS: Stable enlarged cardiac silhouette. Interval bibasilar airspace opacity. No visible pleural fluid. Unremarkable bones. IMPRESSION: 1. Interval bibasilar pneumonia or alveolar edema. 2. Stable cardiomegaly. Electronically Signed   By: Claudie Revering M.D.   On: 11/02/2015 16:54   Dg Hip Unilat With Pelvis 2-3 Views Right  Result Date: 10/31/2015 CLINICAL DATA:  Pain following fall EXAM: DG HIP (WITH OR WITHOUT PELVIS) 2-3V RIGHT COMPARISON:  None. FINDINGS: Frontal pelvis as well as frontal and lateral right hip images were obtained. Bones are osteoporotic. No acute fracture or dislocation is apparent. There is mild symmetric narrowing of both hip joints. There is calcification adjacent to the greater trochanter on the right, likely due to calcific tendinosis. There is levorotoscoliosis in the lower lumbar region with degenerative type change in this area. There are focal areas of vascular calcification. IMPRESSION: Osteoporosis. Osteoarthritic change in the lower lumbar spine and both hip regions. Probable calcific tendinosis lateral right proximal femur region adjacent to the greater  trochanter. No acute fracture or dislocation. There is what is felt to represent a Mach band in the proximal right femur, seen to extend beyond the confines of the bone. Atherosclerotic calcification noted at several sites. Electronically Signed   By: Lowella Grip III M.D.   On: 10/31/2015 15:39    Subjective: No complaints at this time. Pt very eager to go home  Discharge Exam: Vitals:   11/06/15 0417 11/06/15 1304  BP: (!) 163/97 138/74  Pulse: 96 64  Resp: 20 20  Temp: 98.8 F (37.1 C) 97.7 F (36.5 C)   Vitals:   11/05/15 1410 11/05/15 2113 11/06/15 0417 11/06/15 1304  BP: 103/61 136/71 (!) 163/97 138/74  Pulse: (!) 50 61 96 64  Resp: 20 20 20 20   Temp: 97.8 F (36.6 C) 98.6 F (37 C) 98.8 F (37.1 C) 97.7 F (36.5 C)  TempSrc: Oral Oral Oral Oral  SpO2: 99% 96% 97% 99%  Weight:   71.8 kg (158 lb 4.8 oz)   Height:        General: Pt is alert, awake, not in acute distress Cardiovascular: RRR, S1/S2 +, no rubs, no gallops Respiratory: CTA bilaterally, no wheezing, no rhonchi Abdominal: Soft, NT, ND, bowel sounds + Extremities: no edema, no cyanosis   The results of significant diagnostics from this hospitalization (including imaging, microbiology, ancillary and laboratory) are listed below for reference.     Microbiology: No  results found for this or any previous visit (from the past 240 hour(s)).   Labs: BNP (last 3 results)  Recent Labs  11/02/15 0447  BNP [id number]*   Basic Metabolic Panel:  Recent Labs Lab 10/30/15 1939  11/02/15 0447 11/03/15 0551 11/04/15 0515 11/05/15 0530 11/06/15 0435  NA  --   < > 123* 124* 125* 127* 131*  K  --   < > 3.8 3.2* 3.9 3.7 3.8  CL  --   < > 93* 89* 90* 91* 93*  CO2  --   < > 23 26 27 29 30   GLUCOSE  --   < > 111* 107* 105* 108* 103*  BUN  --   < > 14 8 13 16 15   CREATININE  --   < > 0.65 0.50* 0.56* 0.58* 0.72  CALCIUM  --   < > 8.3* 8.5* 8.5* 8.5* 8.7*  MG 2.0  --   --   --   --   --   --   < > = values  in this interval not displayed. Liver Function Tests:  Recent Labs Lab 10/31/15 0202  AST 18  ALT 14*  ALKPHOS 36*  BILITOT 1.6*  PROT 5.5*  ALBUMIN 3.6   No results for input(s): LIPASE, AMYLASE in the last 168 hours. No results for input(s): AMMONIA in the last 168 hours. CBC:  Recent Labs Lab 10/31/15 0202  WBC 6.9  HGB 12.4*  HCT 33.3*  MCV 91.2  PLT 180   Cardiac Enzymes:  Recent Labs Lab 10/30/15 1939 10/31/15 0202 10/31/15 0525  TROPONINI 0.03* <0.03 <0.03   BNP: Invalid input(s): POCBNP CBG: No results for input(s): GLUCAP in the last 168 hours. D-Dimer No results for input(s): DDIMER in the last 72 hours. Hgb A1c No results for input(s): HGBA1C in the last 72 hours. Lipid Profile No results for input(s): CHOL, HDL, LDLCALC, TRIG, CHOLHDL, LDLDIRECT in the last 72 hours. Thyroid function studies No results for input(s): TSH, T4TOTAL, T3FREE, THYROIDAB in the last 72 hours.  Invalid input(s): FREET3 Anemia work up No results for input(s): VITAMINB12, FOLATE, FERRITIN, TIBC, IRON, RETICCTPCT in the last 72 hours. Urinalysis    Component Value Date/Time   COLORURINE YELLOW 10/30/2015 1552   APPEARANCEUR CLEAR 10/30/2015 1552   LABSPEC 1.015 10/30/2015 1552   PHURINE 5.5 10/30/2015 1552   GLUCOSEU NEGATIVE 10/30/2015 1552   HGBUR NEGATIVE 10/30/2015 1552   BILIRUBINUR NEGATIVE 10/30/2015 1552   KETONESUR NEGATIVE 10/30/2015 1552   PROTEINUR NEGATIVE 10/30/2015 1552   NITRITE NEGATIVE 10/30/2015 1552   LEUKOCYTESUR NEGATIVE 10/30/2015 1552   Sepsis Labs Invalid input(s): PROCALCITONIN,  WBC,  LACTICIDVEN Microbiology No results found for this or any previous visit (from the past 240 hour(s)).   SIGNED:   Donne Hazel, MD  [facility] 11/06/2015, 4:42 PM  If 7PM-7AM, please contact night-coverage [email or web address] Password [email or web address]

## 2015-11-06 NOTE — Care Management Important Message (Signed)
Important Message  Patient Details  Name: Zachary Arellano MRN: [id number] Date of Birth: 03/29/1932   Medicare Important Message Given:  Yes    Graylee Arutyunyan, [name], RN 11/06/2015, 10:24 AM

## 2015-11-06 NOTE — Progress Notes (Signed)
SUBJECTIVE: Feels well. Wants to go home. 2 sons in room.   ROS: Other than pertinent positives in "Subjective", all others were reviewed and found to be negative.   Intake/Output Summary (Last 24 hours) at 11/06/15 1640 Last data filed at 11/06/15 0916  Gross per 24 hour  Intake              480 ml  Output             1420 ml  Net             -940 ml    Current Facility-Administered Medications  Medication Dose Route Frequency Provider Last Rate Last Dose  . acetaminophen (TYLENOL) tablet 650 mg  650 mg Oral Q6H PRN Jani Gravel, MD   650 mg at 11/02/15 2139   Or  . acetaminophen (TYLENOL) suppository 650 mg  650 mg Rectal Q6H PRN Jani Gravel, MD      . atenolol (TENORMIN) tablet 50 mg  50 mg Oral Daily Jani Gravel, MD   50 mg at 11/06/15 1036  . benzonatate (TESSALON) capsule 100 mg  100 mg Oral TID PRN Donne Hazel, MD   100 mg at 11/02/15 2139  . cloNIDine (CATAPRES) tablet 0.1 mg  0.1 mg Oral BID Donne Hazel, MD   0.1 mg at 11/06/15 1037  . diltiazem (CARDIZEM CD) 24 hr capsule 180 mg  180 mg Oral Daily Satira Sark, MD   180 mg at 11/06/15 1036  . docusate sodium (COLACE) capsule 100 mg  100 mg Oral Daily Jani Gravel, MD   100 mg at 11/06/15 1036  . dorzolamide-timolol (COSOPT) 22.3-6.8 MG/ML ophthalmic solution 1 drop  1 drop Both Eyes Daily Jani Gravel, MD   1 drop at 11/06/15 1034  . fluticasone (FLONASE) 50 MCG/ACT nasal spray 1 spray  1 spray Each Nare Daily Donne Hazel, MD   1 spray at 11/06/15 1034  . mineral oil enema 1 enema  1 enema Rectal Daily PRN Jani Gravel, MD      . polyethylene glycol (MIRALAX / GLYCOLAX) packet 17 g  17 g Oral Daily PRN Jani Gravel, MD   17 g at 11/03/15 1753  . polyvinyl alcohol (LIQUIFILM TEARS) 1.4 % ophthalmic solution 2 drop  2 drop Both Eyes BID Jani Gravel, MD   2 drop at 11/05/15 2128  . potassium chloride SA (K-DUR,KLOR-CON) CR tablet 20 mEq  20 mEq Oral Once Jani Gravel, MD      . rivaroxaban Alveda Reasons) tablet 20 mg  20 mg Oral QPM  Jani Gravel, MD   20 mg at 11/05/15 1800  . sodium chloride flush (NS) 0.9 % injection 3 mL  3 mL Intravenous Q12H Jani Gravel, MD   3 mL at 11/06/15 1036    Vitals:   11/05/15 1410 11/05/15 2113 11/06/15 0417 11/06/15 1304  BP: 103/61 136/71 (!) 163/97 138/74  Pulse: (!) 50 61 96 64  Resp: 20 20 20 20   Temp: 97.8 F (36.6 C) 98.6 F (37 C) 98.8 F (37.1 C) 97.7 F (36.5 C)  TempSrc: Oral Oral Oral Oral  SpO2: 99% 96% 97% 99%  Weight:   158 lb 4.8 oz (71.8 kg)   Height:        PHYSICAL EXAM General: NAD HEENT: Normal. Neck: No JVD, no thyromegaly.  Lungs: Clear to auscultation bilaterally with normal respiratory effort. CV: Nondisplaced PMI.  Regular rate and irregular rhythm, normal S1/S2, no S3,  no murmur.  No pretibial edema.    Abdomen: Soft, nontender, no distention.  Neurologic: Alert and oriented.  Psych: Normal affect.     LABS: Basic Metabolic Panel:  Recent Labs  11/05/15 0530 11/06/15 0435  NA 127* 131*  K 3.7 3.8  CL 91* 93*  CO2 29 30  GLUCOSE 108* 103*  BUN 16 15  CREATININE 0.58* 0.72  CALCIUM 8.5* 8.7*   Liver Function Tests: No results for input(s): AST, ALT, ALKPHOS, BILITOT, PROT, ALBUMIN in the last 72 hours. No results for input(s): LIPASE, AMYLASE in the last 72 hours. CBC: No results for input(s): WBC, NEUTROABS, HGB, HCT, MCV, PLT in the last 72 hours. Cardiac Enzymes: No results for input(s): CKTOTAL, CKMB, CKMBINDEX, TROPONINI in the last 72 hours. BNP: Invalid input(s): POCBNP D-Dimer: No results for input(s): DDIMER in the last 72 hours. Hemoglobin A1C: No results for input(s): HGBA1C in the last 72 hours. Fasting Lipid Panel: No results for input(s): CHOL, HDL, LDLCALC, TRIG, CHOLHDL, LDLDIRECT in the last 72 hours. Thyroid Function Tests: No results for input(s): TSH, T4TOTAL, T3FREE, THYROIDAB in the last 72 hours.  Invalid input(s): FREET3 Anemia Panel: No results for input(s): VITAMINB12, FOLATE, FERRITIN, TIBC, IRON,  RETICCTPCT in the last 72 hours.  RADIOLOGY: Dg Chest 2 View  Result Date: 10/16/2015 CLINICAL DATA:  Cough and dizziness since May, 2017 after a fall. EXAM: CHEST  2 VIEW COMPARISON:  Single-view of the chest 09/08/2006. FINDINGS: There is cardiomegaly without edema. Lungs are clear. No pneumothorax or pleural effusion. Aortic atherosclerosis is noted. No focal bony abnormality. IMPRESSION: Cardiomegaly without acute disease. Electronically Signed   By: Inge Rise M.D.   On: 10/16/2015 14:41   Dg Ribs Unilateral W/chest Right  Result Date: 10/30/2015 CLINICAL DATA:  Status post fall yesterday with a right chest injury. Pain. EXAM: RIGHT RIBS AND CHEST - 3+ VIEW COMPARISON:  PA and lateral chest 10/16/2015. Single view of the chest 09/08/2006. FINDINGS: The lungs are clear. No pneumothorax or pleural effusion. Heart size is upper normal. Aortic atherosclerosis is noted. Remote healed right sixth and seventh rib fractures are unchanged. No acute fracture is identified. IMPRESSION: No acute abnormality. Remote healed right sixth and seventh rib fractures. Atherosclerosis. Electronically Signed   By: Inge Rise M.D.   On: 10/30/2015 16:35   Ct Head Wo Contrast  Result Date: 10/30/2015 CLINICAL DATA:  Confusion EXAM: CT HEAD WITHOUT CONTRAST TECHNIQUE: Contiguous axial images were obtained from the base of the skull through the vertex without intravenous contrast. COMPARISON:  10/16/2015 FINDINGS: No skull fracture is noted. Paranasal sinuses and mastoid air cells are unremarkable. No intracranial hemorrhage, mass effect or midline shift. Stable atrophy.  Ventricular size is stable from prior exam. No acute cortical infarction. No mass lesion is noted on this unenhanced scan. 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 IMPRESSION: No acute intracranial abnormality. No definite acute cortical infarction. Stable cerebral atrophy. Again noted old infarct in left cerebellum. Electronically Signed    By: Lahoma Crocker M.D.   On: 10/30/2015 16:42   Ct Head Wo Contrast  Result Date: 10/16/2015 CLINICAL DATA:  Dizziness, head injury 5/ 26/17 EXAM: CT HEAD WITHOUT CONTRAST TECHNIQUE: Contiguous axial images were obtained from the base of the skull through the vertex without intravenous contrast. COMPARISON:  09/08/2015 FINDINGS: No skull fracture is noted. No intracranial hemorrhage, mass effect or midline shift. No acute cortical infarction. Paranasal sinuses and mastoid air cells are unremarkable. Stable cerebral atrophy. Stable old infarct  in left cerebellum. No mass lesion is noted on this unenhanced scan. Ventricular size is stable from prior exam. IMPRESSION: No acute intracranial abnormality. Stable cerebral atrophy. Ventricular size is stable from prior exam. Old infarct in left cerebellum again noted. No acute cortical infarction. Electronically Signed   By: Lahoma Crocker M.D.   On: 10/16/2015 14:51   Mr Brain Wo Contrast  Result Date: 10/16/2015 CLINICAL DATA:  Altered mental status and weakness for 2 days. History of prior stroke. Chronic atrial fibrillation. Hypertension. EXAM: MRI HEAD WITHOUT CONTRAST TECHNIQUE: Multiplanar, multiecho pulse sequences of the brain and surrounding structures were obtained without intravenous contrast. COMPARISON:  CT head most recent 10/16/2015 also 09/08/2015. FINDINGS: No evidence for acute infarction, hemorrhage, mass lesion, or extra-axial fluid. Global atrophy. Hydrocephalus ex vacuo. Moderately extensive T2 and FLAIR hyperintensities throughout the white matter, likely chronic microvascular ischemic change. Chronic LEFT cerebellar infarct. Partial empty sella. No tonsillar herniation. Cervical spondylosis. Flow voids are maintained, specifically the LEFT vertebral appears patent. No significant foci of chronic hemorrhage either in the cerebral hemispheres or related to the LEFT cerebellar infarct. Extracranial soft tissues appear unremarkable. Artifactual signal  loss related to dental work on the RIGHT. IMPRESSION: Atrophy and small vessel disease. Old LEFT cerebellar infarct. No acute intracranial findings. Electronically Signed   By: Staci Righter M.D.   On: 10/16/2015 16:56   Dg Chest Port 1 View  Result Date: 11/02/2015 CLINICAL DATA:  Congestive heart failure. EXAM: PORTABLE CHEST 1 VIEW COMPARISON:  10/30/2015. FINDINGS: Stable enlarged cardiac silhouette. Interval bibasilar airspace opacity. No visible pleural fluid. Unremarkable bones. IMPRESSION: 1. Interval bibasilar pneumonia or alveolar edema. 2. Stable cardiomegaly. Electronically Signed   By: Claudie Revering M.D.   On: 11/02/2015 16:54   Dg Hip Unilat With Pelvis 2-3 Views Right  Result Date: 10/31/2015 CLINICAL DATA:  Pain following fall EXAM: DG HIP (WITH OR WITHOUT PELVIS) 2-3V RIGHT COMPARISON:  None. FINDINGS: Frontal pelvis as well as frontal and lateral right hip images were obtained. Bones are osteoporotic. No acute fracture or dislocation is apparent. There is mild symmetric narrowing of both hip joints. There is calcification adjacent to the greater trochanter on the right, likely due to calcific tendinosis. There is levorotoscoliosis in the lower lumbar region with degenerative type change in this area. There are focal areas of vascular calcification. IMPRESSION: Osteoporosis. Osteoarthritic change in the lower lumbar spine and both hip regions. Probable calcific tendinosis lateral right proximal femur region adjacent to the greater trochanter. No acute fracture or dislocation. There is what is felt to represent a Mach band in the proximal right femur, seen to extend beyond the confines of the bone. Atherosclerotic calcification noted at several sites. Electronically Signed   By: Lowella Grip III M.D.   On: 10/31/2015 15:39      ASSESSMENT AND PLAN: 1. Tachycardia-bradycardia syndrome with chronic atrial fibrillation: Symptomatically improved with less labile HR with medication  adjustments made last Friday (reduction of atenolol, clonidine, and diltiazem doses). Continue Xarelto. Have made arrangements for him to have close fu with Dr. Debara Pickett.  2. Essential HTN: Controlled. No changes.  3. Acute diastolic HF: Resolved with one dose of IV Lasix. Likely related to labile HR. Would not discharge on diuretics. LVEF is normal.  Dispo: Spoke with Dr. Wyline Copas. Being discharged to home.  Kate Sable, M.D., F.A.C.C.

## 2015-11-07 ENCOUNTER — Telehealth: Payer: Self-pay | Admitting: Internal Medicine

## 2015-11-07 NOTE — Telephone Encounter (Signed)
Patient contacted regarding discharge from [facility] on 11/06/15}.  Patient understands to follow up with provider Ignacia Bayley  on 11/16/15 at 9:30 am at [facility] office understands discharge instructions? yes  Patient understands medications and regiment? yes Patient understands to bring all medications to this visit? yes

## 2015-11-07 NOTE — Telephone Encounter (Signed)
TOC Phone call Appt on 11/16/2015 at 9:30am w/ Zachary Arellano at [facility] .  Thanks

## 2015-11-08 ENCOUNTER — Ambulatory Visit (INDEPENDENT_AMBULATORY_CARE_PROVIDER_SITE_OTHER): Payer: Medicare Other | Admitting: Cardiology

## 2015-11-08 ENCOUNTER — Encounter: Payer: Self-pay | Admitting: Cardiology

## 2015-11-08 VITALS — BP 142/60 | HR 77 | Ht 68.0 in | Wt 160.8 lb

## 2015-11-08 DIAGNOSIS — E876 Hypokalemia: Secondary | ICD-10-CM | POA: Diagnosis not present

## 2015-11-08 DIAGNOSIS — E871 Hypo-osmolality and hyponatremia: Secondary | ICD-10-CM

## 2015-11-08 DIAGNOSIS — I482 Chronic atrial fibrillation, unspecified: Secondary | ICD-10-CM

## 2015-11-08 DIAGNOSIS — I10 Essential (primary) hypertension: Secondary | ICD-10-CM | POA: Diagnosis not present

## 2015-11-08 DIAGNOSIS — Z79899 Other long term (current) drug therapy: Secondary | ICD-10-CM | POA: Diagnosis not present

## 2015-11-08 DIAGNOSIS — Z7901 Long term (current) use of anticoagulants: Secondary | ICD-10-CM

## 2015-11-08 DIAGNOSIS — I495 Sick sinus syndrome: Secondary | ICD-10-CM

## 2015-11-08 NOTE — Telephone Encounter (Signed)
Returned pt call, not at home.  Called cell phone and son Jenny Reichmann answered (EC, who was with pt) -states they talked to someone yesterday and did not have any further questions or concerns at this time.

## 2015-11-08 NOTE — Telephone Encounter (Signed)
Follow-up ° ° ° ° °The pt is returning the nurses call °

## 2015-11-08 NOTE — Progress Notes (Signed)
Cardiology Office Note   Date:  11/08/2015   ID:  Zachary Arellano, DOB 1932-04-06, MRN [id number]  PCP:  [name], MD  Cardiologist:  Dr. Debara Pickett     Chief Complaint  Patient presents with  . Hospitalization Follow-up    no complaints       History of Present Illness: Zachary Arellano is a 82 y.o. male who presents for post hospitalization for tachy brady -   He presented to ER after being seen by urgent care due to generalized weakness, hypotension,and mild confusion. He apparently fell the day before.   The patient reports that he has been followed by an ophthalmologist who is treating intraocular pressure on the left which is been increasing. He was placed on Diamox 250 mg twice a day and if tolerated to increase it to every 6 hours. He began this approximately 4 days ago, and began taking 4 tablets a day right away. He continued this for 4 days. On Sunday, the patient fell and injured his right lateral chest bruising some ribs. He stopped taking the Diamox that day. It was the following day that he reported to urgent care.  On arrival to ER, BP 103/89, HR 71 afebrile. He was found to be significantly hypokalemic, K+ 2.5, hyponatremic Na+ 121. Creatinine 1.15. He was also found to have leukocytosis, WBC at 12.9. 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 CXR cardiomegaly with out disease.  EKG demonstrated atrial fib with slow ventricular response, rate of 51 bpm.  He was treated with NS, potassium replacement, and tylenol.    Cardizem was decreased from 240 to 180, his lisniopril hctz was stopped. Atenolol was decreased to 50 mg.  And Diamox was stopped.    Acute diastolic HF in hospital resolved with 1 dose of IV lasix.  His meds were adjusted and he was discharged 11/06/15.    He has a history of chronic atrial fibrillation CHADsVASc=3 and hypertension which has been well controlled. Recently, he is on Xarelto which he  seems to be tolerating very well. I had also decreased his aspirin to 81 mg daily. With regards to blood pressure control, he is on several medications. Blood pressure has remained well controlled. He has managed to lose a couple of pounds and remains active and denies any chest pain, worsening shortness of breath, palpitations, presyncope or syncopal symptoms.   Today he feels great.  He was orthostatic in the hospital but today no orthostasis.  BP was elevated some but did not want to increase meds just yet.  He has home health come out as well. No chest pain and not SOB.     Past Medical History:  Diagnosis Date  . Chronic atrial fibrillation (HCC)    2D echo  09/23/2006 LA and right atrium severely dilated, moderate pulmonary hypertension  . Glaucoma   . Hypertension   . Rib fracture   . Stroke (cerebrum) (HCC)    Left cerebellum.    Past Surgical History:  Procedure Laterality Date  . CATARACT EXTRACTION    . skin cancer removal       Current Outpatient Prescriptions  Medication Sig Dispense Refill  . atenolol (TENORMIN) 50 MG tablet Take 1 tablet (50 mg total) by mouth daily. 30 tablet 0  . B Complex-C (B-COMPLEX WITH VITAMIN C) tablet Take 1 tablet by mouth daily.    . bimatoprost (LUMIGAN) 0.03 % ophthalmic solution Place 1  drop into both eyes daily.    . brimonidine-timolol (COMBIGAN) 0.2-0.5 % ophthalmic solution Place 1 drop into both eyes 2 (two) times daily.    . cloNIDine (CATAPRES) 0.1 MG tablet Take 1 tablet (0.1 mg total) by mouth 2 (two) times daily. 60 tablet 0  . diltiazem (CARDIZEM CD) 180 MG 24 hr capsule Take 1 capsule (180 mg total) by mouth daily. 30 capsule 0  . docusate sodium (COLACE) 100 MG capsule Take 1 capsule (100 mg total) by mouth daily. 10 capsule 0  . Multiple Vitamin (MULTIVITAMIN WITH MINERALS) TABS tablet Take 1 tablet by mouth daily.    . Multiple Vitamins-Minerals (VISION FORMULA EYE HEALTH) CAPS Take 1 capsule by mouth daily.    .  pilocarpine (PILOCAR) 2 % ophthalmic solution Place 1 drop into both eyes 4 (four) times daily.     Vladimir Faster Glycol-Propyl Glycol (SYSTANE) 0.4-0.3 % SOLN Apply 2 drops to eye 2 (two) times daily.    Alveda Reasons 20 MG TABS tablet TAKE ONE (1) TABLET EACH DAY 30 tablet 9   No current facility-administered medications for this visit.     Allergies:   Review of patient's allergies indicates no known allergies.    Social History:  The patient  reports that he has quit smoking. He has quit using smokeless tobacco. He reports that he drinks about 6.0 oz of alcohol per week . He reports that he does not use drugs.   Family History:  The patient's family history includes Cancer in his mother; Emphysema in his father; Heart disease in his brother and father.    ROS:  General:no colds or fevers, + weight increase by 2 lbs in clothes. Skin:+ rashes on neck, family will monitor no ulcers HEENT:no blurred vision, no congestion CV:see HPI PUL:see HPI GI:no diarrhea constipation or melena, no indigestion GU:no hematuria, no dysuria MS:no joint pain, no claudication Neuro:no syncope, occ lightheadedness when he stands up  Endo:no diabetes, no thyroid disease  Wt Readings from Last 3 Encounters:  11/08/15 160 lb 12.8 oz (72.9 kg)  11/06/15 158 lb 4.8 oz (71.8 kg)  10/16/15 165 lb (74.8 kg)     PHYSICAL EXAM: VS:  BP (!) 142/60   Pulse 77   Ht 5\' 8"  (1.727 m)   Wt 160 lb 12.8 oz (72.9 kg)   BMI 24.45 kg/m  , BMI Body mass index is 24.45 kg/m. General:Pleasant affect, NAD Skin:Warm and dry, brisk capillary refill, pale pink macular rash at neck.  HEENT:normocephalic, sclera clear, mucus membranes moist Neck:supple, no JVD, no bruits  Heart:irerg irreg without murmur, gallup, rub or click Lungs:clear without rales, rhonchi, or wheezes [id number], non tender, + BS, do not palpate liver spleen or masses Ext:tr to 1+ lower ext edema at ankles, 2+ pedal pulses, 2+ radial pulses Neuro:alert and  oriented X 3, MAE, follows commands, + facial symmetry    EKG:  EKG is ordered today. The ekg ordered today demonstrates a fib rate controlled at 77 no acute changes, LVH   Recent Labs: 10/30/2015: Magnesium 2.0; TSH 2.718 10/31/2015: ALT 14; Hemoglobin 12.4; Platelets 180 11/02/2015: B Natriuretic Peptide 712.0 11/06/2015: BUN 15; Creatinine, Ser 0.72; Potassium 3.8; Sodium 131    Lipid Panel No results found for: CHOL, TRIG, HDL, CHOLHDL, VLDL, LDLCALC, LDLDIRECT     Other studies Reviewed: Additional studies/ records that were reviewed today include: Echo 11/03/15  Study Conclusions  - Left ventricle: The cavity size was normal. Wall thickness was   increased in  a pattern of mild LVH. Systolic function was normal.   The estimated ejection fraction was 55%. Wall motion was normal;   there were no regional wall motion abnormalities. The study is   not technically sufficient to allow evaluation of LV diastolic   function. - Aortic valve: Mildly calcified annulus. Trileaflet. There was   mild regurgitation. Mean gradient (S): 3 mm Hg. Valve area (VTI):   2.19 cm^2. Valve area (Vmax): 1.88 cm^2. - Mitral valve: Mildly calcified leaflets . There was mild   regurgitation. - Left atrium: The atrium was severely dilated. - Right atrium: The atrium was severely dilated. Central venous   pressure (est): 15 mm Hg. - Tricuspid valve: There was mild regurgitation. - Pulmonary arteries: PA peak pressure: 47 mm Hg (S). - Pericardium, extracardiac: There was no pericardial effusion.  Impressions:  - Mild LVH with LVEF approximately 55%. Indeterminate diastolic   function in the setting of atrial fibrillation. Mildly calcified   mitral leaflets with mild mitral regurgitation. Severe left   atrial enlargement. Sclerotic aortic valve with mild aortic   regurgitation. Mild tricuspid regurgitation with PASP 47 mmHg.   Severe right atrial enlargement. Elevated estimated CVP.  ASSESSMENT  AND PLAN:  1. 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 Current medications include atenolol at 50 mg daily, Cardizem CD which was decreased to 180 mg daily from 240. Bradycardia has improved, and rate control at 77.  2. Essential hypertension,  With orthostatic hypotension in hospital - today improved, would add lisinopril HCTZ at lower dose to begin- back if BP remains elevated.  3. Hyponatremia and hypokalemia,  Diamox (treatment of glaucoma) was stopped. Volume overload  Occurred with recent hydration. CXR showed cardiomegaly and possible edema. Echocardiogram ordered. EF normal.  Volume overload resolved with one dose of IV lasix. Will check BMP today.     4. Chronic a fib stable today, on xarelto for anticoagulation without bleeding.  BP check on Friday, follow up with APP in [place] in 2 weeks and keep follow up with Dr. Debara Pickett.    Current medicines are reviewed with the patient today.  The patient Has no concerns regarding medicines.  The following changes have been made:  See above Labs/ tests ordered today include:see above  Disposition:   FU:  see above  Signed, Cecilie Kicks, NP  11/08/2015 6:11 PM    [facility], [place], [place], [place] Phone: [phone]; Fax: [phone]

## 2015-11-08 NOTE — Patient Instructions (Signed)
Your physician recommends that you return for lab work today.  Your physician recommends that you schedule a follow-up appointment in: 2 weeks with APP at the [facility] office. Keep September appointment with Dr. Debara Pickett.

## 2015-11-09 ENCOUNTER — Telehealth: Payer: Self-pay | Admitting: Internal Medicine

## 2015-11-09 LAB — BASIC METABOLIC PANEL WITH GFR
BUN: 15 mg/dL (ref 7–25)
CO2: 33 mmol/L — ABNORMAL HIGH (ref 20–31)
Calcium: 9.6 mg/dL (ref 8.6–10.3)
Chloride: 93 mmol/L — ABNORMAL LOW (ref 98–110)
Creat: 0.75 mg/dL (ref 0.70–1.11)
Glucose, Bld: 102 mg/dL — ABNORMAL HIGH (ref 65–99)
Potassium: 4.5 mmol/L (ref 3.5–5.3)
Sodium: 132 mmol/L — ABNORMAL LOW (ref 135–146)

## 2015-11-09 NOTE — Telephone Encounter (Signed)
New Message  Pt dtrin law calling to speak w/ RN concerning pt's follow up appts after his hospitalization- wanted to know what to do going forward. Please call back and discuss.

## 2015-11-09 NOTE — Telephone Encounter (Signed)
Spoke with pt dtrnlaw, they have lost the paper with the appointment made at discharge from the hospital. Per discharge summary the pt has an appointment with PCP zack hall 11-22-15 @ 3pm.

## 2015-11-10 ENCOUNTER — Ambulatory Visit (INDEPENDENT_AMBULATORY_CARE_PROVIDER_SITE_OTHER): Payer: Medicare Other | Admitting: *Deleted

## 2015-11-10 VITALS — BP 142/84 | HR 54

## 2015-11-10 DIAGNOSIS — I10 Essential (primary) hypertension: Secondary | ICD-10-CM

## 2015-11-10 NOTE — Progress Notes (Signed)
Patient states that he feels great. No complaints at this time.

## 2015-11-10 NOTE — Patient Instructions (Signed)
Your physician recommends that you schedule a follow-up appointment as scheduled.   Your physician recommends that you continue on your current medications as directed. Please refer to the Current Medication list given to you today.  If you need a refill on your cardiac medications before your next appointment, please call your pharmacy.  Thank you for choosing [facility]!

## 2015-11-16 ENCOUNTER — Ambulatory Visit: Payer: 59 | Admitting: Nurse Practitioner

## 2015-11-17 ENCOUNTER — Emergency Department (HOSPITAL_COMMUNITY): Payer: Medicare Other

## 2015-11-17 ENCOUNTER — Inpatient Hospital Stay (HOSPITAL_COMMUNITY)
Admission: EM | Admit: 2015-11-17 | Discharge: 2015-11-27 | DRG: 312 | Disposition: A | Discharge status: Skilled Nursing Facility | Payer: Medicare Other | Attending: Internal Medicine | Admitting: Internal Medicine

## 2015-11-17 ENCOUNTER — Encounter (HOSPITAL_COMMUNITY): Payer: Self-pay | Admitting: Emergency Medicine

## 2015-11-17 DIAGNOSIS — Z825 Family history of asthma and other chronic lower respiratory diseases: Secondary | ICD-10-CM

## 2015-11-17 DIAGNOSIS — N2889 Other specified disorders of kidney and ureter: Secondary | ICD-10-CM | POA: Diagnosis present

## 2015-11-17 DIAGNOSIS — R531 Weakness: Secondary | ICD-10-CM

## 2015-11-17 DIAGNOSIS — Z8249 Family history of ischemic heart disease and other diseases of the circulatory system: Secondary | ICD-10-CM

## 2015-11-17 DIAGNOSIS — K5641 Fecal impaction: Secondary | ICD-10-CM | POA: Diagnosis present

## 2015-11-17 DIAGNOSIS — I4891 Unspecified atrial fibrillation: Secondary | ICD-10-CM | POA: Diagnosis not present

## 2015-11-17 DIAGNOSIS — I482 Chronic atrial fibrillation: Secondary | ICD-10-CM | POA: Diagnosis not present

## 2015-11-17 DIAGNOSIS — H409 Unspecified glaucoma: Secondary | ICD-10-CM | POA: Diagnosis present

## 2015-11-17 DIAGNOSIS — Z9181 History of falling: Secondary | ICD-10-CM

## 2015-11-17 DIAGNOSIS — I10 Essential (primary) hypertension: Secondary | ICD-10-CM | POA: Diagnosis present

## 2015-11-17 DIAGNOSIS — Z7901 Long term (current) use of anticoagulants: Secondary | ICD-10-CM | POA: Diagnosis not present

## 2015-11-17 DIAGNOSIS — Z85828 Personal history of other malignant neoplasm of skin: Secondary | ICD-10-CM

## 2015-11-17 DIAGNOSIS — Z8673 Personal history of transient ischemic attack (TIA), and cerebral infarction without residual deficits: Secondary | ICD-10-CM

## 2015-11-17 DIAGNOSIS — R32 Unspecified urinary incontinence: Secondary | ICD-10-CM | POA: Diagnosis not present

## 2015-11-17 DIAGNOSIS — F0390 Unspecified dementia without behavioral disturbance: Secondary | ICD-10-CM | POA: Diagnosis present

## 2015-11-17 DIAGNOSIS — J690 Pneumonitis due to inhalation of food and vomit: Secondary | ICD-10-CM | POA: Diagnosis present

## 2015-11-17 DIAGNOSIS — I951 Orthostatic hypotension: Secondary | ICD-10-CM | POA: Diagnosis not present

## 2015-11-17 DIAGNOSIS — R1310 Dysphagia, unspecified: Secondary | ICD-10-CM | POA: Diagnosis present

## 2015-11-17 DIAGNOSIS — Y95 Nosocomial condition: Secondary | ICD-10-CM | POA: Diagnosis present

## 2015-11-17 DIAGNOSIS — J189 Pneumonia, unspecified organism: Secondary | ICD-10-CM | POA: Diagnosis present

## 2015-11-17 DIAGNOSIS — Z87891 Personal history of nicotine dependence: Secondary | ICD-10-CM

## 2015-11-17 DIAGNOSIS — I495 Sick sinus syndrome: Secondary | ICD-10-CM

## 2015-11-17 DIAGNOSIS — K649 Unspecified hemorrhoids: Secondary | ICD-10-CM | POA: Diagnosis present

## 2015-11-17 DIAGNOSIS — I5032 Chronic diastolic (congestive) heart failure: Secondary | ICD-10-CM | POA: Diagnosis present

## 2015-11-17 DIAGNOSIS — I110 Hypertensive heart disease with heart failure: Secondary | ICD-10-CM | POA: Diagnosis present

## 2015-11-17 DIAGNOSIS — Z809 Family history of malignant neoplasm, unspecified: Secondary | ICD-10-CM

## 2015-11-17 DIAGNOSIS — T50905A Adverse effect of unspecified drugs, medicaments and biological substances, initial encounter: Secondary | ICD-10-CM | POA: Diagnosis present

## 2015-11-17 DIAGNOSIS — I959 Hypotension, unspecified: Secondary | ICD-10-CM | POA: Diagnosis present

## 2015-11-17 DIAGNOSIS — R198 Other specified symptoms and signs involving the digestive system and abdomen: Secondary | ICD-10-CM

## 2015-11-17 LAB — CBC WITH DIFFERENTIAL/PLATELET
BASOS ABS: 0 10*3/uL (ref 0.0–0.1)
BASOS PCT: 0 %
EOS ABS: 0.1 10*3/uL (ref 0.0–0.7)
Eosinophils Relative: 2 %
HEMATOCRIT: 37.8 % — AB (ref 39.0–52.0)
Hemoglobin: 12.8 g/dL — ABNORMAL LOW (ref 13.0–17.0)
Lymphocytes Relative: 16 %
Lymphs Abs: 1.3 10*3/uL (ref 0.7–4.0)
MCH: 33.2 pg (ref 26.0–34.0)
MCHC: 33.9 g/dL (ref 30.0–36.0)
MCV: 97.9 fL (ref 78.0–100.0)
MONO ABS: 1.1 10*3/uL — AB (ref 0.1–1.0)
Monocytes Relative: 13 %
NEUTROS ABS: 5.4 10*3/uL (ref 1.7–7.7)
Neutrophils Relative %: 69 %
PLATELETS: 231 10*3/uL (ref 150–400)
RBC: 3.86 MIL/uL — ABNORMAL LOW (ref 4.22–5.81)
RDW: 14.2 % (ref 11.5–15.5)
WBC: 7.9 10*3/uL (ref 4.0–10.5)

## 2015-11-17 LAB — COMPREHENSIVE METABOLIC PANEL
ALBUMIN: 3.6 g/dL (ref 3.5–5.0)
ALT: 26 U/L (ref 17–63)
ANION GAP: 9 (ref 5–15)
AST: 20 U/L (ref 15–41)
Alkaline Phosphatase: 59 U/L (ref 38–126)
BILIRUBIN TOTAL: 1.2 mg/dL (ref 0.3–1.2)
BUN: 20 mg/dL (ref 6–20)
CHLORIDE: 95 mmol/L — AB (ref 101–111)
CO2: 30 mmol/L (ref 22–32)
Calcium: 9.4 mg/dL (ref 8.9–10.3)
Creatinine, Ser: 0.89 mg/dL (ref 0.61–1.24)
GFR calc non Af Amer: >60 mL/min (ref >60)
GLUCOSE: 93 mg/dL (ref 65–99)
POTASSIUM: 3.8 mmol/L (ref 3.5–5.1)
SODIUM: 134 mmol/L — AB (ref 135–145)
Total Protein: 6.1 g/dL — ABNORMAL LOW (ref 6.5–8.1)

## 2015-11-17 LAB — URINALYSIS, ROUTINE W REFLEX MICROSCOPIC
Bilirubin Urine: NEGATIVE
GLUCOSE, UA: NEGATIVE mg/dL
Hgb urine dipstick: NEGATIVE
Ketones, ur: NEGATIVE mg/dL
LEUKOCYTES UA: NEGATIVE
NITRITE: NEGATIVE
PH: 6 (ref 5.0–8.0)
Protein, ur: 30 mg/dL — AB
Specific Gravity, Urine: 1.015 (ref 1.005–1.030)

## 2015-11-17 LAB — TROPONIN I
TROPONIN I: 0.05 ng/mL — AB (ref <0.03)
Troponin I: 0.04 ng/mL (ref <0.03)
Troponin I: 0.04 ng/mL (ref <0.03)

## 2015-11-17 LAB — PROTIME-INR
INR: 1.62
Prothrombin Time: 19.4 seconds — ABNORMAL HIGH (ref 11.4–15.2)

## 2015-11-17 LAB — URINE MICROSCOPIC-ADD ON: RBC / HPF: NONE SEEN RBC/hpf (ref 0–5)

## 2015-11-17 MED ORDER — DILTIAZEM HCL ER COATED BEADS 120 MG PO CP24: 120.0000 mg | ORAL_CAPSULE | Freq: Every day | ORAL | 1 refills | Status: DC

## 2015-11-17 MED ORDER — MAGNESIUM HYDROXIDE 400 MG/5ML PO SUSP
30.0000 mL | Freq: Once | ORAL | Status: AC
  Administered 2015-11-17: 30 mL via ORAL
  Filled 2015-11-17: qty 30

## 2015-11-17 MED ORDER — ATENOLOL 25 MG PO TABS
25.0000 mg | ORAL_TABLET | Freq: Every day | ORAL | Status: DC
  Administered 2015-11-18 – 2015-11-22 (×5): 25 mg via ORAL
  Filled 2015-11-17 (×5): qty 1

## 2015-11-17 MED ORDER — SODIUM CHLORIDE 0.9% FLUSH
3.0000 mL | Freq: Two times a day (BID) | INTRAVENOUS | Status: DC
  Administered 2015-11-17 – 2015-11-27 (×16): 3 mL via INTRAVENOUS

## 2015-11-17 MED ORDER — SODIUM CHLORIDE 0.9 % IV SOLN
Freq: Once | INTRAVENOUS | Status: AC
  Administered 2015-11-17: 15:00:00 via INTRAVENOUS

## 2015-11-17 MED ORDER — SODIUM CHLORIDE 0.9 % IV SOLN
INTRAVENOUS | Status: AC
  Administered 2015-11-17 – 2015-11-18 (×2): via INTRAVENOUS

## 2015-11-17 MED ORDER — SODIUM CHLORIDE 0.9 % IV BOLUS (SEPSIS)
1000.0000 mL | Freq: Once | INTRAVENOUS | Status: AC
  Administered 2015-11-17: 1000 mL via INTRAVENOUS

## 2015-11-17 NOTE — H&P (Signed)
History and Physical    CALIXTO VASILOPOULOS [id number] DOB: 1931/09/18 DOA: 11/17/2015  PCP: [name], MD  Patient coming from:  home  Chief Complaint:  Fall, getting dizzy  HPI: Zachary Arellano is a 82 y.o. male with medical history significant of diastolic CHF, recent diagnosis of glaucoma, HTN, tachy-brady syndrome comes in after a fall today after getting dizzy upon standing and weak.  His wife and multiple sons are present.  They relate his problem of dizziness and weakness all began when he was started on diamox for his gluacuma 2 months ago.  This has since been stopped and changed to eye drops.  He has a hospitalization where his clonidine was stopped, his calcium channel blocker was cut in half and his dizzy spells persist when he stands up.  Pt reports that he eats and drinks appropriate fluids every day, verified by his wife.  He has had no LOC during these spells.  Always happens when he gets up to stand and walk.  He does use a walker and gets up slowly.  He has had no fevers.   No recent illnesses.  No n/v/d.  Pt is found to be orthostatic, family is concerned about him falling.  Referred for admission for orthostatic hypotension.  Review of Systems: As per HPI otherwise 10 point review of systems negative.   Past Medical History:  Diagnosis Date  . Chronic atrial fibrillation (HCC)    2D echo  09/23/2006 LA and right atrium severely dilated, moderate pulmonary hypertension  . Glaucoma   . Hypertension   . Rib fracture   . Stroke (cerebrum) (HCC)    Left cerebellum.    Past Surgical History:  Procedure Laterality Date  . CATARACT EXTRACTION    . skin cancer removal       reports that he has quit smoking. He has quit using smokeless tobacco. He reports that he drinks about 6.0 oz of alcohol per week . He reports that he does not use drugs.  No Known Allergies  Family History  Problem Relation Age of Onset  . Cancer Mother   . Heart disease Father   . Emphysema Father     . Heart disease Brother     Prior to Admission medications   Medication Sig Start Date End Date Taking? Authorizing Provider  acetaZOLAMIDE (DIAMOX) 250 MG tablet Take 250 mg by mouth 4 (four) times daily. 10/25/15  Yes Historical Provider, MD  atenolol (TENORMIN) 50 MG tablet Take 1 tablet (50 mg total) by mouth daily. 11/06/15  Yes Donne Hazel, MD  B Complex-C (B-COMPLEX WITH VITAMIN C) tablet Take 1 tablet by mouth daily.   Yes Historical Provider, MD  bimatoprost (LUMIGAN) 0.03 % ophthalmic solution Place 1 drop into both eyes daily.   Yes Historical Provider, MD  brimonidine-timolol (COMBIGAN) 0.2-0.5 % ophthalmic solution Place 1 drop into both eyes 2 (two) times daily.   Yes Historical Provider, MD  docusate sodium (COLACE) 100 MG capsule Take 1 capsule (100 mg total) by mouth daily. 11/06/15  Yes Donne Hazel, MD  Multiple Vitamin (MULTIVITAMIN WITH MINERALS) TABS tablet Take 1 tablet by mouth daily.   Yes Historical Provider, MD  Multiple Vitamins-Minerals (VISION FORMULA EYE HEALTH) CAPS Take 1 capsule by mouth daily.   Yes Historical Provider, MD  pilocarpine (PILOCAR) 2 % ophthalmic solution Place 1 drop into both eyes 4 (four) times daily.  10/25/15  Yes Historical Provider, MD  Polyethyl Glycol-Propyl Glycol (SYSTANE) 0.4-0.3 %  SOLN Apply 2 drops to eye 2 (two) times daily.   Yes Historical Provider, MD  XARELTO 20 MG TABS tablet TAKE ONE (1) TABLET EACH DAY 04/13/15  Yes Pixie Casino, MD  cloNIDine (CATAPRES) 0.1 MG tablet Take 1 tablet (0.1 mg total) by mouth 2 (two) times daily. Patient not taking: Reported on 11/17/2015 11/06/15   Donne Hazel, MD  diltiazem (CARDIZEM CD) 120 MG 24 hr capsule Take 1 capsule (120 mg total) by mouth daily. 11/17/15   Nat Christen, MD    Physical Exam: Vitals:   11/17/15 1702 11/17/15 1800 11/17/15 1900 11/17/15 1930  BP:  91/59 (!) 89/55 94/57  Pulse:   72 104  Resp:  19 23 24   Temp:      TempSrc:      SpO2: 100%  100% 99%  Weight:           Constitutional: NAD, calm, comfortable Vitals:   11/17/15 1702 11/17/15 1800 11/17/15 1900 11/17/15 1930  BP:  91/59 (!) 89/55 94/57  Pulse:   72 104  Resp:  19 23 24   Temp:      TempSrc:      SpO2: 100%  100% 99%  Weight:       Eyes: PERRL, lids and conjunctivae normal ENMT: Mucous membranes are moist. Posterior pharynx clear of any exudate or lesions.Normal dentition.  Neck: normal, supple, no masses, no thyromegaly Respiratory: clear to auscultation bilaterally, no wheezing, no crackles. Normal respiratory effort. No accessory muscle use.  Cardiovascular: Regular rate and rhythm, no murmurs / rubs / gallops. No extremity edema. 2+ pedal pulses. No carotid bruits.  Abdomen: no tenderness, no masses palpated. No hepatosplenomegaly. Bowel sounds positive.  Musculoskeletal: no clubbing / cyanosis. No joint deformity upper and lower extremities. Good ROM, no contractures. Normal muscle tone.  Skin: no rashes, lesions, ulcers. No induration Neurologic: CN 2-12 grossly intact. Sensation intact, DTR normal. Strength 5/5 in all 4.  Psychiatric: Normal judgment and insight. Alert and oriented x 3. Normal mood.    Labs on Admission: I have personally reviewed following labs and imaging studies  CBC:  Recent Labs Lab 11/17/15 1435  WBC 7.9  NEUTROABS 5.4  HGB 12.8*  HCT 37.8*  MCV 97.9  PLT [id number]   Basic Metabolic Panel:  Recent Labs Lab 11/17/15 1435  NA 134*  K 3.8  CL 95*  CO2 30  GLUCOSE 93  BUN 20  CREATININE 0.89  CALCIUM 9.4   GFR: Estimated Creatinine Clearance: 61.9 mL/min (by C-G formula based on SCr of 0.89 mg/dL). Liver Function Tests:  Recent Labs Lab 11/17/15 1435  AST 20  ALT 26  ALKPHOS 59  BILITOT 1.2  PROT 6.1*  ALBUMIN 3.6   Coagulation Profile:  Recent Labs Lab 11/17/15 1435  INR 1.62   Cardiac Enzymes:  Recent Labs Lab 11/17/15 1435 11/17/15 1712  TROPONINI 0.05* 0.04*   Urine analysis:    Component Value Date/Time    COLORURINE YELLOW 11/17/2015 [employer] 11/17/2015 1355   LABSPEC 1.015 11/17/2015 1355   PHURINE 6.0 11/17/2015 1355   GLUCOSEU NEGATIVE 11/17/2015 1355   HGBUR NEGATIVE 11/17/2015 1355   BILIRUBINUR NEGATIVE 11/17/2015 1355   KETONESUR NEGATIVE 11/17/2015 1355   PROTEINUR 30 (A) 11/17/2015 1355   NITRITE NEGATIVE 11/17/2015 1355   LEUKOCYTESUR NEGATIVE 11/17/2015 1355    Radiological Exams on Admission: Dg Chest Portable 1 View  Result Date: 11/17/2015 CLINICAL DATA:  Hypotension.  Sudden onset dizziness today. EXAM:  PORTABLE CHEST 1 VIEW COMPARISON:  Most recent chest radiographs 11/02/2015, additional priors. FINDINGS: Improved bibasilar and perihilar aeration from prior exam. Question of residual retrocardiac opacity is suboptimally assessed by portable technique. There stable cardiomegaly and atherosclerotic calcification of the aortic Stefanos. No large pleural effusion or pneumothorax. Remote right rib fractures. IMPRESSION: Improved perihilar and bibasilar aeration from prior exam, improved pneumonia or pulmonary edema. Question residual opacity at the left lung base suboptimally assessed by portable technique. Stable cardiomegaly and aortic atherosclerosis. Electronically Signed   By: Jeb Levering M.D.   On: 11/17/2015 19:48   Dg Abd Portable 2 Views  Result Date: 11/17/2015 CLINICAL DATA:  Constipation.  Abdominal pain. EXAM: PORTABLE ABDOMEN - 2 VIEW COMPARISON:  None. FINDINGS: Lung bases are within normal limits. No free air, portal venous gas, or pneumatosis. Mild fecal loading in the colon. No bowel obstruction. Scoliotic and degenerative changes seen in the spine. No other acute abnormalities. IMPRESSION: Mild fecal loading in the colon.  No other acute abnormalities. Electronically Signed   By: Dorise Bullion III M.D   On: 11/17/2015 14:39    EKG: Independently reviewed. afib  Assessment/Plan 82 yo male with orthostatic hypotension  Principal Problem:    Orthostatic hypotension- pts med list is incorrect.  He is suppose to also be on cardizem at lower dose adjusted last couple of weeks.  hes not taking the diamox.  Will decrease his atenolol to 25mg  daily from 50mg  daily.  His clonidine has been stopped.  Gentle ivf overnight.  Will repeat orthostatic vitals q shift. Pt placed on fall precautions.  Active Problems:   Atrial fibrillation ([place])- noted   Essential hypertension- adjusting bp meds   Current use of long term anticoagulation- on xaralto, continue once med list is completed   General weakness- due to above   Tachycardia-bradycardia syndrome ([place])- they have been told he does not need a pacemaker at this time   Chronic diastolic CHF (congestive heart failure) ([place])- compensated and stable at this time.  Not on chronic diuretics   Family interested in some short term rehab, CM consult has been put in.  I had a full discussion about every medication has side effects and the benefits of his cardiac medications and his glaucoma meds.  They believe it is his eye drops causing this problem.  I have recommended further adjustments of his oral cardiac meds before stopping his glaucoma drops.  I have also explained that stopping some of his cardiac meds could result in other problems such as uncontrolled heart rate with his afib.  Finding the right combination of medications for him may take some time.  They understand, and would really like rehab if this is possible.   DVT prophylaxis:   Scds on xaralto Code Status:   Full code.  Cortland Crehan A MD [facility]  If 7PM-7AM, please contact night-coverage [email or web address] Password [facility]  11/17/2015, 8:32 PM

## 2015-11-17 NOTE — ED Triage Notes (Signed)
Per EMS patient from home due to dizziness and weakness. Patient alert and oriented. Patient's home health nurse called ems.

## 2015-11-17 NOTE — ED Notes (Signed)
CRITICAL VALUE ALERT  Critical value received:  Troponin 0.05  Date of notification: 11/17/2015  Time of notification:  1533  Critical value read back: Yes  Nurse who received alert:  Hinton Rao, RN   MD notified (1st page):  Dr. Lacinda Axon

## 2015-11-17 NOTE — Discharge Instructions (Signed)
Tests showed no life-threatening condition. Increase fluids. Rise slowly. Use your walker. Recommend help at home.  Reduce Cardizem CD to 120 mg daily. Prescription given.

## 2015-11-17 NOTE — ED Provider Notes (Addendum)
[facility] DEPT Provider Note   CSN: [id number] Arrival date & time: 11/17/15  1324  First Provider Contact:  First MD Initiated Contact with Patient 11/17/15 1346   By signing my name below, I, Georgette Shell, attest that this documentation has been prepared under the direction and in the presence of Nat Christen, MD. Electronically Signed: Georgette Shell, ED Scribe. 11/17/15. 2:06 PM.  History   Chief Complaint Chief Complaint  Patient presents with  . Dizziness   HPI Comments: Zachary Arellano is a 82 y.o. male with h/o A-fib, HTN, and stroke who presents to the Emergency Department by EMS complaining of sudden onset, constant dizziness onset today. Pt also has associated weakness and urinary frequency. Pt has a home health nurse. According to the nurse, his blood sugar might have been low which caused him to become light-headed. Pt was at baseline yesterday but notes he had a minor fall. Pt is ambulatory with a walker. Pt was recently diagnosed with A-fib a couple weeks ago and was hospitalized. He was put on Lasix during his visit but is not on it at this time. Pt is on Xarelto and is compliant with his other medications. Pt is not in any pain at this time. Denies chest pain, shortness of breath, fever, cough, or any other associated symptoms. Per pt's son, he seems to be at baseline at this time.   PCP: Lenetta Quaker The history is provided by the patient. No language interpreter was used.    Past Medical History:  Diagnosis Date  . Chronic atrial fibrillation (HCC)    2D echo  09/23/2006 LA and right atrium severely dilated, moderate pulmonary hypertension  . Glaucoma   . Hypertension   . Rib fracture   . Stroke (cerebrum) (HCC)    Left cerebellum.    Patient Active Problem List   Diagnosis Date Noted  . Tachycardia-bradycardia syndrome ([place])   . Acute diastolic heart failure ([place])   . General weakness   . Rib contusion   . Hyponatremia 10/30/2015  . Hypokalemia 10/30/2015  .  Symptomatic bradycardia   . Current use of long term anticoagulation 10/14/2013  . Atrial fibrillation ([place]) 09/08/2012  . Essential hypertension 09/08/2012    Past Surgical History:  Procedure Laterality Date  . CATARACT EXTRACTION    . skin cancer removal         Home Medications    Prior to Admission medications   Medication Sig Start Date End Date Taking? Authorizing Provider  acetaZOLAMIDE (DIAMOX) 250 MG tablet Take 250 mg by mouth 4 (four) times daily. 10/25/15  Yes Historical Provider, MD  atenolol (TENORMIN) 50 MG tablet Take 1 tablet (50 mg total) by mouth daily. 11/06/15  Yes Donne Hazel, MD  B Complex-C (B-COMPLEX WITH VITAMIN C) tablet Take 1 tablet by mouth daily.   Yes Historical Provider, MD  bimatoprost (LUMIGAN) 0.03 % ophthalmic solution Place 1 drop into both eyes daily.   Yes Historical Provider, MD  brimonidine-timolol (COMBIGAN) 0.2-0.5 % ophthalmic solution Place 1 drop into both eyes 2 (two) times daily.   Yes Historical Provider, MD  diltiazem (CARDIZEM CD) 180 MG 24 hr capsule Take 1 capsule (180 mg total) by mouth daily. 11/06/15  Yes Donne Hazel, MD  docusate sodium (COLACE) 100 MG capsule Take 1 capsule (100 mg total) by mouth daily. 11/06/15  Yes Donne Hazel, MD  Multiple Vitamin (MULTIVITAMIN WITH MINERALS) TABS tablet Take 1 tablet by mouth daily.   Yes Historical Provider,  MD  Multiple Vitamins-Minerals (VISION FORMULA EYE HEALTH) CAPS Take 1 capsule by mouth daily.   Yes Historical Provider, MD  pilocarpine (PILOCAR) 2 % ophthalmic solution Place 1 drop into both eyes 4 (four) times daily.  10/25/15  Yes Historical Provider, MD  Polyethyl Glycol-Propyl Glycol (SYSTANE) 0.4-0.3 % SOLN Apply 2 drops to eye 2 (two) times daily.   Yes Historical Provider, MD  XARELTO 20 MG TABS tablet TAKE ONE (1) TABLET EACH DAY 04/13/15  Yes Pixie Casino, MD  cloNIDine (CATAPRES) 0.1 MG tablet Take 1 tablet (0.1 mg total) by mouth 2 (two) times daily. Patient  not taking: Reported on 11/17/2015 11/06/15   Donne Hazel, MD    Family History Family History  Problem Relation Age of Onset  . Cancer Mother   . Heart disease Father   . Emphysema Father   . Heart disease Brother     Social History Social History  Substance Use Topics  . Smoking status: Former Research scientist (life sciences)  . Smokeless tobacco: Former Systems developer     Comment: quit smoking in his 45's  . Alcohol use 6.0 oz/week    10 Standard drinks or equivalent per week     Comment: two beers per day    Allergies   Review of patient's allergies indicates no known allergies.   Review of Systems Review of Systems  Ten systems reviewed and are negative for acute change, except as noted in the HPI.  Physical Exam Updated Vital Signs BP 109/64   Pulse 74   Temp 99.8 F (37.7 C) (Rectal)   Resp 12   Wt 160 lb (72.6 kg)   SpO2 100%   BMI 24.33 kg/m   Physical Exam  Constitutional: He is oriented to person, place, and time. He appears well-developed and well-nourished.  Alert, answering questions appropriately. Looked pale.   HENT:  Head: Normocephalic and atraumatic.  Eyes: Conjunctivae are normal.  Neck: Neck supple.  Cardiovascular: Normal rate and regular rhythm.   Pulmonary/Chest: Effort normal and breath sounds normal.  Abdominal: Soft. Bowel sounds are normal.  Musculoskeletal: Normal range of motion.  Neurological: He is alert and oriented to person, place, and time.  Skin: Skin is warm and dry. There is pallor.  Psychiatric: He has a normal mood and affect. His behavior is normal.  Nursing note and vitals reviewed.    ED Treatments / Results  DIAGNOSTIC STUDIES: Oxygen Saturation is 100% on RA, normal by my interpretation.    COORDINATION OF CARE: 2:04 PM Discussed treatment plan with pt at bedside which includes IVF and lab work and pt agreed to plan.  Labs (all labs ordered are listed, but only abnormal results are displayed) Labs Reviewed  CBC WITH DIFFERENTIAL/PLATELET  - Abnormal; Notable for the following:       Result Value   RBC 3.86 (*)    Hemoglobin 12.8 (*)    HCT 37.8 (*)    Monocytes Absolute 1.1 (*)    All other components within normal limits  COMPREHENSIVE METABOLIC PANEL - Abnormal; Notable for the following:    Sodium 134 (*)    Chloride 95 (*)    Total Protein 6.1 (*)    All other components within normal limits  URINALYSIS, ROUTINE W REFLEX MICROSCOPIC (NOT AT [facility]) - Abnormal; Notable for the following:    Protein, ur 30 (*)    All other components within normal limits  PROTIME-INR - Abnormal; Notable for the following:    Prothrombin Time 19.4 (*)  All other components within normal limits  TROPONIN I - Abnormal; Notable for the following:    Troponin I 0.05 (*)    All other components within normal limits  URINE MICROSCOPIC-ADD ON - Abnormal; Notable for the following:    Squamous Epithelial / LPF 0-5 (*)    Bacteria, UA RARE (*)    Casts GRANULAR CAST (*)    All other components within normal limits  TROPONIN I - Abnormal; Notable for the following:    Troponin I 0.04 (*)    All other components within normal limits    EKG  EKG Interpretation  Date/Time:  Friday November 17 2015 13:36:41 EDT Ventricular Rate:  96 PR Interval:    QRS Duration: 78 QT Interval:  396 QTC Calculation: 501 R Axis:   60 Text Interpretation:  Atrial fibrillation Prolonged QT interval Confirmed by Lacinda Axon  MD, Kieli Golladay ([id number]) on 11/17/2015 2:23:19 PM       Radiology Dg Abd Portable 2 Views  Result Date: 11/17/2015 CLINICAL DATA:  Constipation.  Abdominal pain. EXAM: PORTABLE ABDOMEN - 2 VIEW COMPARISON:  None. FINDINGS: Lung bases are within normal limits. No free air, portal venous gas, or pneumatosis. Mild fecal loading in the colon. No bowel obstruction. Scoliotic and degenerative changes seen in the spine. No other acute abnormalities. IMPRESSION: Mild fecal loading in the colon.  No other acute abnormalities. Electronically Signed   By: Dorise Bullion III M.D   On: 11/17/2015 14:39    Procedures Procedures (including critical care time)  Medications Ordered in ED Medications  0.9 %  sodium chloride infusion ( Intravenous Stopped 11/17/15 1612)  sodium chloride 0.9 % bolus 1,000 mL (1,000 mLs Intravenous New Bag/Given 11/17/15 1612)     Initial Impression / Assessment and Plan / ED Course  I have reviewed the triage vital signs and the nursing notes.  Pertinent labs & imaging results that were available during my care of the patient were reviewed by me and considered in my medical decision making (see chart for details).  Clinical Course    Patient complains of dizziness and weakness. Family reports he has slowly been failing over the last few months. He is alert and oriented 3 in the Emergency Department without neurological deficits. Screening labs, urinalysis, EKG all stable. First troponin minimally elevated. Second troponin has decreased. Discussed with family.  1900 Blood pressure now 90 systolic. No chest pain, dyspnea, neurodeficits. Will admit to telemetry.  Final Clinical Impressions(s) / ED Diagnoses   Final diagnoses:  Orthostatic hypotension    New Prescriptions New Prescriptions   No medications on file  I personally performed the services described in this documentation, which was scribed in my presence. The recorded information has been reviewed and is accurate.      Nat Christen, MD 11/17/15 [place], MD 11/17/15 [place], MD 11/17/15 [phone]

## 2015-11-17 NOTE — ED Notes (Signed)
Spoke to dr [employer] about patient and family concerns regarding low blood pressure and pt's concerns.  Dr Lacinda Axon will discuss with Hospitalist for possible admission.  Pt and family agreeable to same

## 2015-11-18 ENCOUNTER — Observation Stay (HOSPITAL_COMMUNITY): Payer: Medicare Other

## 2015-11-18 DIAGNOSIS — Z7901 Long term (current) use of anticoagulants: Secondary | ICD-10-CM | POA: Diagnosis not present

## 2015-11-18 DIAGNOSIS — I5032 Chronic diastolic (congestive) heart failure: Secondary | ICD-10-CM | POA: Diagnosis not present

## 2015-11-18 DIAGNOSIS — I951 Orthostatic hypotension: Secondary | ICD-10-CM | POA: Diagnosis not present

## 2015-11-18 LAB — BASIC METABOLIC PANEL
ANION GAP: 11 (ref 5–15)
BUN: 19 mg/dL (ref 6–20)
CALCIUM: 9.3 mg/dL (ref 8.9–10.3)
CO2: 27 mmol/L (ref 22–32)
CREATININE: 0.73 mg/dL (ref 0.61–1.24)
Chloride: 97 mmol/L — ABNORMAL LOW (ref 101–111)
Glucose, Bld: 101 mg/dL — ABNORMAL HIGH (ref 65–99)
Potassium: 3.8 mmol/L (ref 3.5–5.1)
SODIUM: 135 mmol/L (ref 135–145)

## 2015-11-18 LAB — TROPONIN I
TROPONIN I: 0.03 ng/mL — AB (ref <0.03)
Troponin I: 0.03 ng/mL (ref <0.03)

## 2015-11-18 LAB — CBC
HCT: 41.7 % (ref 39.0–52.0)
Hemoglobin: 14.2 g/dL (ref 13.0–17.0)
MCH: 33.3 pg (ref 26.0–34.0)
MCHC: 34.1 g/dL (ref 30.0–36.0)
MCV: 97.7 fL (ref 78.0–100.0)
PLATELETS: 271 10*3/uL (ref 150–400)
RBC: 4.27 MIL/uL (ref 4.22–5.81)
RDW: 14.3 % (ref 11.5–15.5)
WBC: 15.3 10*3/uL — AB (ref 4.0–10.5)

## 2015-11-18 MED ORDER — IOPAMIDOL (ISOVUE-300) INJECTION 61%
100.0000 mL | Freq: Once | INTRAVENOUS | Status: AC | PRN
  Administered 2015-11-18: 100 mL via INTRAVENOUS

## 2015-11-18 MED ORDER — DIATRIZOATE MEGLUMINE & SODIUM 66-10 % PO SOLN
ORAL | Status: AC
  Administered 2015-11-18: 30 mL
  Filled 2015-11-18: qty 30

## 2015-11-18 MED ORDER — GUAIFENESIN 100 MG/5ML PO SOLN
5.0000 mL | ORAL | Status: DC | PRN
  Administered 2015-11-18 – 2015-11-26 (×2): 100 mg via ORAL
  Filled 2015-11-18 (×3): qty 5

## 2015-11-18 MED ORDER — RIVAROXABAN 20 MG PO TABS
20.0000 mg | ORAL_TABLET | Freq: Every day | ORAL | Status: DC
  Administered 2015-11-19 – 2015-11-20 (×2): 20 mg via ORAL
  Filled 2015-11-18 (×3): qty 1

## 2015-11-18 NOTE — Progress Notes (Signed)
[facility] PROGRESS NOTE  Zachary Arellano [id number] DOB: 04/01/1932    PCP:   [name], MD   HPI: Zachary Arellano is an 82 y.o. male with hx of chronic afib, HTN, prior CVA, glaucoma, frequent falls, admitted for orthostatic symptomology, modest elevation of troponin above the normal range, no CP/SOB.  He complained to me of tenesmus, and never had a colonoscopy previously.  He has been taking laxatives.    Rewiew of Systems:  Constitutional: Negative for malaise, fever and chills. No significant weight loss or weight gain Eyes: Negative for eye pain, redness and discharge, diplopia, visual changes, or flashes of light. ENMT: Negative for ear pain, hoarseness, nasal congestion, sinus pressure and sore throat. No headaches; tinnitus, drooling, or problem swallowing. Cardiovascular: Negative for chest pain, palpitations, diaphoresis, dyspnea and peripheral edema. ; No orthopnea, PND Respiratory: Negative for cough, hemoptysis, wheezing and stridor. No pleuritic chestpain. Gastrointestinal: Negative for nausea, vomiting, diarrhea, constipation, abdominal pain, melena, blood in stool, hematemesis, jaundice and rectal bleeding.    Genitourinary: Negative for frequency, dysuria, incontinence,flank pain and hematuria; Musculoskeletal: Negative for back pain and neck pain. Negative for swelling and trauma.;  Skin: . Negative for pruritus, rash, abrasions, bruising and skin lesion.; ulcerations Neuro: Negative for headache, lightheadedness and neck stiffness. Negative for weakness, altered level of consciousness , altered mental status, extremity weakness, burning feet, involuntary movement, seizure and syncope.  Psych: negative for anxiety, depression, insomnia, tearfulness, panic attacks, hallucinations, paranoia, suicidal or homicidal ideation    Past Medical History:  Diagnosis Date  . Chronic atrial fibrillation (HCC)    2D echo  09/23/2006 LA and right atrium severely dilated,  moderate pulmonary hypertension  . Glaucoma   . Hypertension   . Rib fracture   . Stroke (cerebrum) (HCC)    Left cerebellum.    Past Surgical History:  Procedure Laterality Date  . CATARACT EXTRACTION    . skin cancer removal      Medications:  HOME MEDS: Prior to Admission medications   Medication Sig Start Date End Date Taking? Authorizing Provider  acetaZOLAMIDE (DIAMOX) 250 MG tablet Take 250 mg by mouth 4 (four) times daily. 10/25/15  Yes Historical Provider, MD  atenolol (TENORMIN) 50 MG tablet Take 1 tablet (50 mg total) by mouth daily. 11/06/15  Yes Donne Hazel, MD  B Complex-C (B-COMPLEX WITH VITAMIN C) tablet Take 1 tablet by mouth daily.   Yes Historical Provider, MD  bimatoprost (LUMIGAN) 0.03 % ophthalmic solution Place 1 drop into both eyes daily.   Yes Historical Provider, MD  brimonidine-timolol (COMBIGAN) 0.2-0.5 % ophthalmic solution Place 1 drop into both eyes 2 (two) times daily.   Yes Historical Provider, MD  docusate sodium (COLACE) 100 MG capsule Take 1 capsule (100 mg total) by mouth daily. 11/06/15  Yes Donne Hazel, MD  Multiple Vitamin (MULTIVITAMIN WITH MINERALS) TABS tablet Take 1 tablet by mouth daily.   Yes Historical Provider, MD  Multiple Vitamins-Minerals (VISION FORMULA EYE HEALTH) CAPS Take 1 capsule by mouth daily.   Yes Historical Provider, MD  pilocarpine (PILOCAR) 2 % ophthalmic solution Place 1 drop into both eyes 4 (four) times daily.  10/25/15  Yes Historical Provider, MD  Polyethyl Glycol-Propyl Glycol (SYSTANE) 0.4-0.3 % SOLN Apply 2 drops to eye 2 (two) times daily.   Yes Historical Provider, MD  XARELTO 20 MG TABS tablet TAKE ONE (1) TABLET EACH DAY 04/13/15  Yes Pixie Casino, MD  cloNIDine (CATAPRES) 0.1 MG tablet Take 1  tablet (0.1 mg total) by mouth 2 (two) times daily. Patient not taking: Reported on 11/17/2015 11/06/15   Donne Hazel, MD  diltiazem (CARDIZEM CD) 120 MG 24 hr capsule Take 1 capsule (120 mg total) by mouth daily.  11/17/15   Nat Christen, MD     Allergies:  No Known Allergies  Social History:   reports that he has quit smoking. He has quit using smokeless tobacco. He reports that he drinks about 6.0 oz of alcohol per week . He reports that he does not use drugs.  Family History: Family History  Problem Relation Age of Onset  . Cancer Mother   . Heart disease Father   . Emphysema Father   . Heart disease Brother      Physical Exam: Vitals:   11/17/15 1930 11/17/15 2040 11/17/15 2043 11/18/15 0654  BP: 94/57   126/88  Pulse: 104   71  Resp: 24 20  20   Temp:  98.3 F (36.8 C)  97.9 F (36.6 C)  TempSrc:    Oral  SpO2: 99%   99%  Weight:   67.4 kg (148 lb 11.2 oz)    Blood pressure 126/88, pulse 71, temperature 97.9 F (36.6 C), temperature source Oral, resp. rate 20, weight 67.4 kg (148 lb 11.2 oz), SpO2 99 %.  GEN:  Pleasant patient lying in the stretcher in no acute distress; cooperative with exam. PSYCH:  alert and oriented x4; does not appear anxious or depressed; affect is appropriate. HEENT: Mucous membranes pink and anicteric; PERRLA; EOM intact; no cervical lymphadenopathy nor thyromegaly or carotid bruit; no JVD; There were no stridor. Neck is very supple. Breasts:: Not examined CHEST WALL: No tenderness CHEST: Normal respiration, clear to auscultation bilaterally.  HEART: Regular rate and rhythm.  There are no murmur, rub, or gallops.   BACK: No kyphosis or scoliosis; no CVA tenderness ABDOMEN: soft and non-tender; no masses, no organomegaly, normal abdominal bowel sounds; no pannus; no intertriginous candida. There is no rebound and no distention. Rectal Exam: Not done EXTREMITIES: No bone or joint deformity; age-appropriate arthropathy of the hands and knees; no edema; no ulcerations.  There is no calf tenderness. Genitalia: not examined PULSES: 2+ and symmetric SKIN: Normal hydration no rash or ulceration CNS: Cranial nerves 2-12 grossly intact no focal lateralizing  neurologic deficit.  Speech is fluent; uvula elevated with phonation, facial symmetry and tongue midline. DTR are normal bilaterally, cerebella exam is intact, barbinski is negative and strengths are equaled bilaterally.  No sensory loss.   Labs on Admission:  Basic Metabolic Panel:  Recent Labs Lab 11/17/15 1435 11/18/15 0655  NA 134* 135  K 3.8 3.8  CL 95* 97*  CO2 30 27  GLUCOSE 93 101*  BUN 20 19  CREATININE 0.89 0.73  CALCIUM 9.4 9.3   Liver Function Tests:  Recent Labs Lab 11/17/15 1435  AST 20  ALT 26  ALKPHOS 59  BILITOT 1.2  PROT 6.1*  ALBUMIN 3.6   CBC:  Recent Labs Lab 11/17/15 1435 11/18/15 0655  WBC 7.9 15.3*  NEUTROABS 5.4  --   HGB 12.8* 14.2  HCT 37.8* 41.7  MCV 97.9 97.7  PLT 231 271   Cardiac Enzymes:  Recent Labs Lab 11/17/15 1435 11/17/15 1712 11/17/15 2110 11/18/15 0242  TROPONINI 0.05* 0.04* 0.04* 0.03*    Radiological Exams on Admission: Dg Chest Portable 1 View  Result Date: 11/17/2015 CLINICAL DATA:  Hypotension.  Sudden onset dizziness today. EXAM: PORTABLE CHEST 1 VIEW COMPARISON:  Most recent chest radiographs 11/02/2015, additional priors. FINDINGS: Improved bibasilar and perihilar aeration from prior exam. Question of residual retrocardiac opacity is suboptimally assessed by portable technique. There stable cardiomegaly and atherosclerotic calcification of the aortic [place]. No large pleural effusion or pneumothorax. Remote right rib fractures. IMPRESSION: Improved perihilar and bibasilar aeration from prior exam, improved pneumonia or pulmonary edema. Question residual opacity at the left lung base suboptimally assessed by portable technique. Stable cardiomegaly and aortic atherosclerosis. Electronically Signed   By: Jeb Levering M.D.   On: 11/17/2015 19:48   Dg Abd Portable 2 Views  Result Date: 11/17/2015 CLINICAL DATA:  Constipation.  Abdominal pain. EXAM: PORTABLE ABDOMEN - 2 VIEW COMPARISON:  None. FINDINGS: Lung bases  are within normal limits. No free air, portal venous gas, or pneumatosis. Mild fecal loading in the colon. No bowel obstruction. Scoliotic and degenerative changes seen in the spine. No other acute abnormalities. IMPRESSION: Mild fecal loading in the colon.  No other acute abnormalities. Electronically Signed   By: Dorise Bullion III M.D   On: 11/17/2015 14:39   Assessment/Plan Present on Admission: . Orthostatic hypotension . Atrial fibrillation ([place]) . Tachycardia-bradycardia syndrome ([place]) . Chronic diastolic CHF (congestive heart failure) ([place]) . Essential hypertension  PLAN:  Orthostatic symptomology:  Avoid sudden change in position.  Adjust medication to minimize side effect.  Obtain PT consult, and request STR and help from CM.  Tenesmus:  Will obtain abdominal pelvic CT with contrast.  I am concern about colon CA.  Will consult Dr Dereck Leep per patient and family request Dr Dereck Leep.    Afib:  Rate is controlled.  Will continue with Xarelto.    Chronic diastolic  CHF:  Stable.   HTN:  BP is controlled.  Off Clonidine and lower dose of Atenelol and Cardiazem.    Other plans as per orders. Code Status: FULL Haskel Khan, MD.  FACP [facility] Pager [phone] 7pm to 7am.  11/18/2015, 12:26 PM

## 2015-11-18 NOTE — Care Management Obs Status (Signed)
[place] NOTIFICATION   Patient Details  Name: Zachary Arellano MRN: [id number] Date of Birth: 02-19-1932   Medicare Observation Status Notification Given:  Yes    Briant Sites, RN 11/18/2015, 12:25 PM

## 2015-11-19 DIAGNOSIS — Z7901 Long term (current) use of anticoagulants: Secondary | ICD-10-CM | POA: Diagnosis not present

## 2015-11-19 DIAGNOSIS — I5032 Chronic diastolic (congestive) heart failure: Secondary | ICD-10-CM | POA: Diagnosis not present

## 2015-11-19 DIAGNOSIS — I10 Essential (primary) hypertension: Secondary | ICD-10-CM | POA: Diagnosis not present

## 2015-11-19 DIAGNOSIS — I951 Orthostatic hypotension: Secondary | ICD-10-CM | POA: Diagnosis not present

## 2015-11-19 LAB — TSH: TSH: 1.803 u[IU]/mL (ref 0.350–4.500)

## 2015-11-19 LAB — VITAMIN B12: VITAMIN B 12: 1760 pg/mL — AB (ref 180–914)

## 2015-11-19 MED ORDER — BISACODYL 10 MG RE SUPP
10.0000 mg | Freq: Once | RECTAL | Status: AC
Start: 2015-11-19 — End: 2015-11-19
  Administered 2015-11-19: 10 mg via RECTAL
  Filled 2015-11-19: qty 1

## 2015-11-19 MED ORDER — MAGNESIUM CITRATE PO SOLN
1.0000 | Freq: Once | ORAL | Status: AC
  Administered 2015-11-19: 1 via ORAL
  Filled 2015-11-19: qty 296

## 2015-11-19 NOTE — Progress Notes (Signed)
[facility] PROGRESS NOTE  Zachary Arellano [id number] DOB: [birth year]    PCP:   [name], MD   HPI:  Zachary Arellano is an 82 y.o. male with hx of chronic afib, HTN, prior CVA, glaucoma, frequent falls, admitted for orthostatic symptomology, modest elevation of troponin above the normal range, no CP/SOB.  He complained to me of tenesmus, and never had a colonoscopy previously.  He has been taking laxatives.  He was admitted and his beta blocker reduced, clonidine discontinued, and his orthostatic symptomology improved.  He was able to ambulate without significant problem.  Nevertheless, family and patient would like to go to [facility].  PT evaluation was requested.  For his tenesmus, I was concerned about colon maligancy, and family had requested Dr Dereck Leep to see him.  While waiting for the consultation, I obtained an abdominal pelvic CT which showed obstipation, along with bladder wall thickening, and a right exophylic renal mass needing follow up.  Family and patient was told of this.  He was also noted to have memory problem, raising possibility of dementia as well.   Rewiew of Systems:  Constitutional: Negative for malaise, fever and chills. No significant weight loss or weight gain Eyes: Negative for eye pain, redness and discharge, diplopia, visual changes, or flashes of light. ENMT: Negative for ear pain, hoarseness, nasal congestion, sinus pressure and sore throat. No headaches; tinnitus, drooling, or problem swallowing. Cardiovascular: Negative for chest pain, palpitations, diaphoresis, dyspnea and peripheral edema. ; No orthopnea, PND Respiratory: Negative for cough, hemoptysis, wheezing and stridor. No pleuritic chestpain. Gastrointestinal: Negative for nausea, vomiting, diarrhea, constipation, abdominal pain, melena, blood in stool, hematemesis, jaundice and rectal bleeding.    Genitourinary: Negative for frequency, dysuria, incontinence,flank pain and hematuria; Musculoskeletal:  Negative for back pain and neck pain. Negative for swelling and trauma.;  Skin: . Negative for pruritus, rash, abrasions, bruising and skin lesion.; ulcerations Neuro: Negative for headache, lightheadedness and neck stiffness. Negative for weakness, altered level of consciousness , altered mental status, extremity weakness, burning feet, involuntary movement, seizure and syncope.  Psych: negative for anxiety, depression, insomnia, tearfulness, panic attacks, hallucinations, paranoia, suicidal or homicidal ideation    Past Medical History:  Diagnosis Date  . Chronic atrial fibrillation (HCC)    2D echo  09/23/2006 LA and right atrium severely dilated, moderate pulmonary hypertension  . Glaucoma   . Hypertension   . Rib fracture   . Stroke (cerebrum) (HCC)    Left cerebellum.    Past Surgical History:  Procedure Laterality Date  . CATARACT EXTRACTION    . skin cancer removal      Medications:  HOME MEDS: Prior to Admission medications   Medication Sig Start Date End Date Taking? Authorizing Provider  acetaZOLAMIDE (DIAMOX) 250 MG tablet Take 250 mg by mouth 4 (four) times daily. 10/25/15  Yes Historical Provider, MD  atenolol (TENORMIN) 50 MG tablet Take 1 tablet (50 mg total) by mouth daily. 11/06/15  Yes Donne Hazel, MD  B Complex-C (B-COMPLEX WITH VITAMIN C) tablet Take 1 tablet by mouth daily.   Yes Historical Provider, MD  bimatoprost (LUMIGAN) 0.03 % ophthalmic solution Place 1 drop into both eyes daily.   Yes Historical Provider, MD  brimonidine-timolol (COMBIGAN) 0.2-0.5 % ophthalmic solution Place 1 drop into both eyes 2 (two) times daily.   Yes Historical Provider, MD  docusate sodium (COLACE) 100 MG capsule Take 1 capsule (100 mg total) by mouth daily. 11/06/15  Yes Donne Hazel, MD  Multiple  Vitamin (MULTIVITAMIN WITH MINERALS) TABS tablet Take 1 tablet by mouth daily.   Yes Historical Provider, MD  Multiple Vitamins-Minerals (VISION FORMULA EYE HEALTH) CAPS Take 1  capsule by mouth daily.   Yes Historical Provider, MD  pilocarpine (PILOCAR) 2 % ophthalmic solution Place 1 drop into both eyes 4 (four) times daily.  10/25/15  Yes Historical Provider, MD  Polyethyl Glycol-Propyl Glycol (SYSTANE) 0.4-0.3 % SOLN Apply 2 drops to eye 2 (two) times daily.   Yes Historical Provider, MD  XARELTO 20 MG TABS tablet TAKE ONE (1) TABLET EACH DAY 04/13/15  Yes Pixie Casino, MD  cloNIDine (CATAPRES) 0.1 MG tablet Take 1 tablet (0.1 mg total) by mouth 2 (two) times daily. Patient not taking: Reported on 11/17/2015 11/06/15   Donne Hazel, MD  diltiazem (CARDIZEM CD) 120 MG 24 hr capsule Take 1 capsule (120 mg total) by mouth daily. 11/17/15   Nat Christen, MD     Allergies:  No Known Allergies  Social History:   reports that he has quit smoking. He has quit using smokeless tobacco. He reports that he drinks about 6.0 oz of alcohol per week . He reports that he does not use drugs.  Family History: Family History  Problem Relation Age of Onset  . Cancer Mother   . Heart disease Father   . Emphysema Father   . Heart disease Brother      Physical Exam: Vitals:   11/18/15 1300 11/18/15 2254 11/19/15 0606 11/19/15 0700  BP: 118/64 (!) 156/75 (!) 164/103 117/83  Pulse: 76 (!) 115 (!) 105 93  Resp: 20 20 20 20   Temp: 98 F (36.7 C) 98.6 F (37 C) 98.1 F (36.7 C) 98.5 F (36.9 C)  TempSrc: Oral Oral Oral Oral  SpO2: 100% 99% 97% 97%  Weight:       Blood pressure 117/83, pulse 93, temperature 98.5 F (36.9 C), temperature source Oral, resp. rate 20, weight 67.4 kg (148 lb 11.2 oz), SpO2 97 %.  GEN:  Pleasant  patient lying in the stretcher in no acute distress; cooperative with exam. PSYCH:  alert and oriented x4; does not appear anxious or depressed; affect is appropriate. HEENT: Mucous membranes pink and anicteric; PERRLA; EOM intact; no cervical lymphadenopathy nor thyromegaly or carotid bruit; no JVD; There were no stridor. Neck is very supple. Breasts::  Not examined CHEST WALL: No tenderness CHEST: Normal respiration, clear to auscultation bilaterally.  HEART: Regular rate and rhythm.  There are no murmur, rub, or gallops.   BACK: No kyphosis or scoliosis; no CVA tenderness ABDOMEN: soft and non-tender; no masses, no organomegaly, normal abdominal bowel sounds; no pannus; no intertriginous candida. There is no rebound and no distention. Rectal Exam: Not done EXTREMITIES: No bone or joint deformity; age-appropriate arthropathy of the hands and knees; no edema; no ulcerations.  There is no calf tenderness. Genitalia: not examined PULSES: 2+ and symmetric SKIN: Normal hydration no rash or ulceration CNS: Cranial nerves 2-12 grossly intact no focal lateralizing neurologic deficit.  Speech is fluent; uvula elevated with phonation, facial symmetry and tongue midline. DTR are normal bilaterally, cerebella exam is intact, barbinski is negative and strengths are equaled bilaterally.  No sensory loss.   Labs on Admission:  Basic Metabolic Panel:  Recent Labs Lab 11/17/15 1435 11/18/15 0655  NA 134* 135  K 3.8 3.8  CL 95* 97*  CO2 30 27  GLUCOSE 93 101*  BUN 20 19  CREATININE 0.89 0.73  CALCIUM 9.4 9.3  Liver Function Tests:  Recent Labs Lab 11/17/15 1435  AST 20  ALT 26  ALKPHOS 59  BILITOT 1.2  PROT 6.1*  ALBUMIN 3.6   CBC:  Recent Labs Lab 11/17/15 1435 11/18/15 0655  WBC 7.9 15.3*  NEUTROABS 5.4  --   HGB 12.8* 14.2  HCT 37.8* 41.7  MCV 97.9 97.7  PLT 231 271   Cardiac Enzymes:  Recent Labs Lab 11/17/15 1435 11/17/15 1712 11/17/15 2110 11/18/15 0242 11/18/15 1117  TROPONINI 0.05* 0.04* 0.04* 0.03* 0.03*   Radiological Exams on Admission: Ct Abdomen Pelvis W Contrast  Result Date: 11/18/2015 CLINICAL DATA:  Admitted for orthostatics symptoms, elevation of troponin. EXAM: CT ABDOMEN AND PELVIS WITH CONTRAST TECHNIQUE: Multidetector CT imaging of the abdomen and pelvis was performed using the standard  protocol following bolus administration of intravenous contrast. CONTRAST:  183mL ISOVUE-300 IOPAMIDOL (ISOVUE-300) INJECTION 61% COMPARISON:  None. FINDINGS: Lower chest: Mild scarring/atelectasis at each lung base. Trace pleural effusions at each lung base. Hepatobiliary: Liver and gallbladder appear normal. Pancreas: No mass, inflammatory changes, or other significant abnormality. Spleen: Within normal limits in size and appearance. Adrenals/Urinary Tract: Right kidney appears normal without mass, stone or hydronephrosis. Multiple hypodense masses are seen within the kidneys, largest located in the lower and superior poles compatible with benign cysts by CT density measurements. An additional smaller lesion exophytic to the posterior cortex of the right kidney, measuring 1 x 1 cm, is too small to definitively characterize but not definitive for benign cyst by CT density measurement. Bladder walls appear thickened circumferentially, however, this may be due to the partial bladder decompression. Fluid stranding/inflammation noted in the anterior pelvis adjacent to the bladder, increasing my suspicion of cystitis. Stomach/Bowel: Large amount of stool within the rectosigmoid colon. Moderate amount of stool and gas throughout the remainder of the colon, suggesting constipation. Appendix is normal. No small bowel dilatation. No evidence of bowel wall inflammation. Stomach appears normal. Vascular/Lymphatic: Scattered atherosclerotic changes of the normal caliber abdominal aorta and pelvic vasculature. No enlarged lymph nodes seen. Reproductive: No mass or other significant abnormality. Other: No free fluid or abscess collection. Mild fluid stranding/ inflammation adjacent to the bladder. Musculoskeletal: Degenerative changes throughout the scoliotic thoracolumbar spine. No acute or suspicious osseous finding. IMPRESSION: 1. Large amount of stool within the rectosigmoid colon, distending the rectal vault, suggesting  fecal impaction. Moderate amount of stool and gas throughout the remainder of the colon suggesting associated constipation. 2. Circumferential thickening of the bladder walls, possibly accentuated to some degree by the partial bladder decompression, but suspicious for cystitis as there is fluid stranding/inflammation about the bladder. Consider correlation with urinalysis. 3. 1 cm hyperdense mass exophytic to the posterior cortex of the right kidney. This is too small to definitively characterize. I favor hemorrhagic cyst but would consider follow-up CT or MRI at some point to ensure benignity. Additional benign-appearing cysts within the upper and lower poles of the left kidney. 4. Small bilateral pleural effusions. 5. Aortic atherosclerosis. Electronically Signed   By: Franki Cabot M.D.   On: 11/18/2015 20:14   Dg Chest Portable 1 View  Result Date: 11/17/2015 CLINICAL DATA:  Hypotension.  Sudden onset dizziness today. EXAM: PORTABLE CHEST 1 VIEW COMPARISON:  Most recent chest radiographs 11/02/2015, additional priors. FINDINGS: Improved bibasilar and perihilar aeration from prior exam. Question of residual retrocardiac opacity is suboptimally assessed by portable technique. There stable cardiomegaly and atherosclerotic calcification of the aortic [place]. No large pleural effusion or pneumothorax. Remote right rib fractures. IMPRESSION:  Improved perihilar and bibasilar aeration from prior exam, improved pneumonia or pulmonary edema. Question residual opacity at the left lung base suboptimally assessed by portable technique. Stable cardiomegaly and aortic atherosclerosis. Electronically Signed   By: Jeb Levering M.D.   On: 11/17/2015 19:48   Dg Abd Portable 2 Views  Result Date: 11/17/2015 CLINICAL DATA:  Constipation.  Abdominal pain. EXAM: PORTABLE ABDOMEN - 2 VIEW COMPARISON:  None. FINDINGS: Lung bases are within normal limits. No free air, portal venous gas, or pneumatosis. Mild fecal loading in the  colon. No bowel obstruction. Scoliotic and degenerative changes seen in the spine. No other acute abnormalities. IMPRESSION: Mild fecal loading in the colon.  No other acute abnormalities. Electronically Signed   By: Dorise Bullion III M.D   On: 11/17/2015 14:39   Assessment/Plan Present on Admission: . Orthostatic hypotension . Atrial fibrillation ([place]) . Tachycardia-bradycardia syndrome ([place]) . Chronic diastolic CHF (congestive heart failure) ([place]) . Essential hypertension  PLAN:  Orthostatic symptomology:  Improved.  Family and patient would like to obtain PT eval and they prefer him to go to [facility].  He is at risk for falling and had fallen before with injury.  Obstipation:  Avoid future use of laxative.  Plan is to try Mag citrate, and if not working, will proceed with Go-Lytely.  Will await Dr Ewell Poe consultation for colonoscopy.    Afib:  Rate controlled.   On Xarelto.  Some risk with anticoagulation due to falling risk.  Chronic diastolic CHF:  Compensated.  Confusion:  Supsicious for dementia.  Check RPR, HIV, B12, and TSH.   Bladder wall thickening:  Will recommend urology follow up.  Renal mass:  3.1cm.  ? Hemorrhagic cyst, will need follow up.  Other plans as per orders.  Code Status: FULL Haskel Khan, MD.  FACP [facility] Pager [phone] 7pm to 7am.  11/19/2015, 12:07 PM

## 2015-11-19 NOTE — Progress Notes (Signed)
CM was not able to speak with family regarding ABN and current discharge plans.  Last communication was on 8/5 and with plans were to d/c home with  Home health and use [facility] because of the OBS status. Communicating with staff family wants patient for rehab.

## 2015-11-20 ENCOUNTER — Observation Stay (HOSPITAL_COMMUNITY): Payer: Medicare Other

## 2015-11-20 DIAGNOSIS — Z85828 Personal history of other malignant neoplasm of skin: Secondary | ICD-10-CM | POA: Diagnosis not present

## 2015-11-20 DIAGNOSIS — Y95 Nosocomial condition: Secondary | ICD-10-CM | POA: Diagnosis present

## 2015-11-20 DIAGNOSIS — I5032 Chronic diastolic (congestive) heart failure: Secondary | ICD-10-CM | POA: Diagnosis not present

## 2015-11-20 DIAGNOSIS — R198 Other specified symptoms and signs involving the digestive system and abdomen: Secondary | ICD-10-CM | POA: Diagnosis present

## 2015-11-20 DIAGNOSIS — I482 Chronic atrial fibrillation: Secondary | ICD-10-CM | POA: Diagnosis not present

## 2015-11-20 DIAGNOSIS — H409 Unspecified glaucoma: Secondary | ICD-10-CM | POA: Diagnosis present

## 2015-11-20 DIAGNOSIS — I959 Hypotension, unspecified: Secondary | ICD-10-CM

## 2015-11-20 DIAGNOSIS — K649 Unspecified hemorrhoids: Secondary | ICD-10-CM | POA: Diagnosis present

## 2015-11-20 DIAGNOSIS — K5909 Other constipation: Secondary | ICD-10-CM | POA: Diagnosis not present

## 2015-11-20 DIAGNOSIS — I495 Sick sinus syndrome: Secondary | ICD-10-CM | POA: Diagnosis present

## 2015-11-20 DIAGNOSIS — I110 Hypertensive heart disease with heart failure: Secondary | ICD-10-CM | POA: Diagnosis present

## 2015-11-20 DIAGNOSIS — Z9181 History of falling: Secondary | ICD-10-CM | POA: Diagnosis not present

## 2015-11-20 DIAGNOSIS — Z809 Family history of malignant neoplasm, unspecified: Secondary | ICD-10-CM | POA: Diagnosis not present

## 2015-11-20 DIAGNOSIS — I951 Orthostatic hypotension: Secondary | ICD-10-CM | POA: Diagnosis not present

## 2015-11-20 DIAGNOSIS — Z825 Family history of asthma and other chronic lower respiratory diseases: Secondary | ICD-10-CM | POA: Diagnosis not present

## 2015-11-20 DIAGNOSIS — Z7901 Long term (current) use of anticoagulants: Secondary | ICD-10-CM

## 2015-11-20 DIAGNOSIS — J189 Pneumonia, unspecified organism: Secondary | ICD-10-CM | POA: Diagnosis present

## 2015-11-20 DIAGNOSIS — T50905A Adverse effect of unspecified drugs, medicaments and biological substances, initial encounter: Secondary | ICD-10-CM | POA: Diagnosis present

## 2015-11-20 DIAGNOSIS — N2889 Other specified disorders of kidney and ureter: Secondary | ICD-10-CM | POA: Diagnosis present

## 2015-11-20 DIAGNOSIS — J690 Pneumonitis due to inhalation of food and vomit: Secondary | ICD-10-CM | POA: Diagnosis present

## 2015-11-20 DIAGNOSIS — Z8673 Personal history of transient ischemic attack (TIA), and cerebral infarction without residual deficits: Secondary | ICD-10-CM | POA: Diagnosis not present

## 2015-11-20 DIAGNOSIS — K5641 Fecal impaction: Secondary | ICD-10-CM | POA: Diagnosis present

## 2015-11-20 DIAGNOSIS — I4891 Unspecified atrial fibrillation: Secondary | ICD-10-CM | POA: Diagnosis not present

## 2015-11-20 DIAGNOSIS — F0390 Unspecified dementia without behavioral disturbance: Secondary | ICD-10-CM | POA: Diagnosis present

## 2015-11-20 DIAGNOSIS — Z8249 Family history of ischemic heart disease and other diseases of the circulatory system: Secondary | ICD-10-CM | POA: Diagnosis not present

## 2015-11-20 DIAGNOSIS — R1310 Dysphagia, unspecified: Secondary | ICD-10-CM | POA: Diagnosis present

## 2015-11-20 DIAGNOSIS — Z87891 Personal history of nicotine dependence: Secondary | ICD-10-CM | POA: Diagnosis not present

## 2015-11-20 DIAGNOSIS — I10 Essential (primary) hypertension: Secondary | ICD-10-CM | POA: Diagnosis not present

## 2015-11-20 DIAGNOSIS — R32 Unspecified urinary incontinence: Secondary | ICD-10-CM | POA: Diagnosis not present

## 2015-11-20 LAB — BLOOD GAS, ARTERIAL
Acid-Base Excess: 4.1 mmol/L — ABNORMAL HIGH (ref 0.0–2.0)
Bicarbonate: 28.2 mEq/L — ABNORMAL HIGH (ref 20.0–24.0)
DRAWN BY: 23534
FIO2: 0.21
O2 CONTENT: 21 L/min
O2 SAT: 91.1 %
pCO2 arterial: 36.8 mmHg (ref 35.0–45.0)
pH, Arterial: 7.485 — ABNORMAL HIGH (ref 7.350–7.450)
pO2, Arterial: 59.9 mmHg — ABNORMAL LOW (ref 80.0–100.0)

## 2015-11-20 LAB — RPR: RPR Ser Ql: NONREACTIVE

## 2015-11-20 LAB — HIV ANTIBODY (ROUTINE TESTING W REFLEX): HIV Screen 4th Generation wRfx: NONREACTIVE

## 2015-11-20 MED ORDER — DEXTROSE-NACL 5-0.9 % IV SOLN
INTRAVENOUS | Status: AC
  Administered 2015-11-20: 14:00:00 via INTRAVENOUS

## 2015-11-20 MED ORDER — AMOXICILLIN-POT CLAVULANATE 875-125 MG PO TABS: 1.0000 | ORAL_TABLET | Freq: Two times a day (BID) | ORAL | Status: DC

## 2015-11-20 MED ORDER — POLYETHYLENE GLYCOL 3350 17 G PO PACK
17.0000 g | PACK | Freq: Every day | ORAL | Status: DC
  Administered 2015-11-20 – 2015-11-27 (×5): 17 g via ORAL
  Filled 2015-11-20 (×6): qty 1

## 2015-11-20 MED ORDER — LACTULOSE 10 GM/15ML PO SOLN
10.0000 g | Freq: Every day | ORAL | Status: DC
  Administered 2015-11-20 – 2015-11-27 (×6): 10 g via ORAL
  Filled 2015-11-20 (×8): qty 30

## 2015-11-20 MED ORDER — NYSTATIN-TRIAMCINOLONE 100000-0.1 UNIT/GM-% EX CREA
TOPICAL_CREAM | Freq: Two times a day (BID) | CUTANEOUS | Status: AC
  Administered 2015-11-20 – 2015-11-22 (×3): via TOPICAL
  Administered 2015-11-22 – 2015-11-23 (×2): 1 via TOPICAL
  Administered 2015-11-24 – 2015-11-26 (×7): via TOPICAL
  Filled 2015-11-20: qty 15

## 2015-11-20 MED ORDER — PIPERACILLIN-TAZOBACTAM 3.375 G IVPB
3.3750 g | Freq: Three times a day (TID) | INTRAVENOUS | Status: DC
  Administered 2015-11-20 – 2015-11-25 (×14): 3.375 g via INTRAVENOUS
  Filled 2015-11-20 (×12): qty 50

## 2015-11-20 MED ORDER — NYSTATIN-TRIAMCINOLONE 100000-0.1 UNIT/GM-% EX CREA
TOPICAL_CREAM | Freq: Two times a day (BID) | CUTANEOUS | Status: DC
  Administered 2015-11-21 (×2): via TOPICAL
  Filled 2015-11-20 (×2): qty 15

## 2015-11-20 NOTE — Consult Note (Signed)
Referring Provider: No ref. provider found Primary Care Physician:  [name], MD Primary Gastroenterologist:  Dr. Laural Golden  Reason for Consultation:    Obstipation/fecal impaction.  HPI:   Patient is 82 year old Caucasian male who was admitted to hospitalist service 3 days ago for postural lightheadedness and a fall. Patient was noted to have orthostatic hypotension possibly due to his medications. While in the hospital he is been having difficulty with bowel movements. Abdominopelvic CT was obtained and revealed large amount of fecal matter in rectosigmoid area with distended rectum consistent with fecal impaction. Patient was given mag citrate last night but he only took a few sips. He was also given bisacodyl suppository last night. He is passing some stool per rectum. He has constant urge to have a bowel movement and has made multiple trips to the bathroom this morning. He feels his hemorrhoids are swollen. He states he has had problems with constipation for 4-5 years and has been using various preparations on as-needed basis. He denies abdominal pain melena or rectal bleeding. According to his wife he has not had a good appetite and has lost weight. Patient states is baseline weight has been around 170 pounds and this admission he weighed 148 pounds. Therefore he has lost around 22 pounds. He has never been screened for CRC. Patient is on Xarelto for chronic atrial fibrillation.   Past Medical History:  Diagnosis Date  . Chronic atrial fibrillation (HCC)    2D echo  09/23/2006 LA and right atrium severely dilated, moderate pulmonary hypertension  . Glaucoma   . Hypertension   . Rib fracture   . Stroke (cerebrum) (HCC)    Left cerebellum.    Past Surgical History:  Procedure Laterality Date  . CATARACT EXTRACTION    . skin cancer removal      Prior to Admission medications   Medication Sig Start Date End Date Taking? Authorizing Provider  acetaZOLAMIDE (DIAMOX) 250 MG tablet  Take 250 mg by mouth 4 (four) times daily. 10/25/15  Yes Historical Provider, MD  atenolol (TENORMIN) 50 MG tablet Take 1 tablet (50 mg total) by mouth daily. 11/06/15  Yes Donne Hazel, MD  B Complex-C (B-COMPLEX WITH VITAMIN C) tablet Take 1 tablet by mouth daily.   Yes Historical Provider, MD  bimatoprost (LUMIGAN) 0.03 % ophthalmic solution Place 1 drop into both eyes daily.   Yes Historical Provider, MD  brimonidine-timolol (COMBIGAN) 0.2-0.5 % ophthalmic solution Place 1 drop into both eyes 2 (two) times daily.   Yes Historical Provider, MD  docusate sodium (COLACE) 100 MG capsule Take 1 capsule (100 mg total) by mouth daily. 11/06/15  Yes Donne Hazel, MD  Multiple Vitamin (MULTIVITAMIN WITH MINERALS) TABS tablet Take 1 tablet by mouth daily.   Yes Historical Provider, MD  Multiple Vitamins-Minerals (VISION FORMULA EYE HEALTH) CAPS Take 1 capsule by mouth daily.   Yes Historical Provider, MD  pilocarpine (PILOCAR) 2 % ophthalmic solution Place 1 drop into both eyes 4 (four) times daily.  10/25/15  Yes Historical Provider, MD  Polyethyl Glycol-Propyl Glycol (SYSTANE) 0.4-0.3 % SOLN Apply 2 drops to eye 2 (two) times daily.   Yes Historical Provider, MD  XARELTO 20 MG TABS tablet TAKE ONE (1) TABLET EACH DAY 04/13/15  Yes Pixie Casino, MD  cloNIDine (CATAPRES) 0.1 MG tablet Take 1 tablet (0.1 mg total) by mouth 2 (two) times daily. Patient not taking: Reported on 11/17/2015 11/06/15   Donne Hazel, MD  diltiazem (CARDIZEM CD) 120 MG 24  hr capsule Take 1 capsule (120 mg total) by mouth daily. 11/17/15   Nat Christen, MD    Current Facility-Administered Medications  Medication Dose Route Frequency Provider Last Rate Last Dose  . atenolol (TENORMIN) tablet 25 mg  25 mg Oral Daily Phillips Grout, MD   25 mg at 11/20/15 0907  . guaiFENesin (ROBITUSSIN) 100 MG/5ML solution 100 mg  5 mL Oral Q4H PRN Phillips Grout, MD   100 mg at 11/18/15 0505  . rivaroxaban (XARELTO) tablet 20 mg  20 mg Oral Daily  Orvan Falconer, MD   20 mg at 11/20/15 0907  . sodium chloride flush (NS) 0.9 % injection 3 mL  3 mL Intravenous Q12H Phillips Grout, MD   3 mL at 11/20/15 0907    Allergies as of 11/17/2015  . (No Known Allergies)    Family History  Problem Relation Age of Onset  . Cancer Mother   . Heart disease Father   . Emphysema Father   . Heart disease Brother    Social history: He is married. He has or grownup sons in good health. One son committed suicide at age 48 or 30. 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 He is very tired for 30. He used to drink beer but hasn't had any in one month. He smoked about a pack a day for 20 but quit 30 years ago.   Review of Systems: See HPI, otherwise normal ROS  Physical Exam: Temp:  [97.9 F (36.6 C)-98.6 F (37 C)] 97.9 F (36.6 C) (08/07 0628) Pulse Rate:  [102-114] 102 (08/07 0628) Resp:  [20] 20 (08/07 0628) BP: (132-170)/(74-93) 155/93 (08/07 0628) SpO2:  [95 %-97 %] 95 % (08/07 0628) Last BM Date: 11/19/15 Patient is alert and in no acute distress. Conjunctiva is pink. Sclerae nonicteric. Oropharyngeal mucosa is normal. Dentition in satisfactory condition. No neck masses or thyromegaly noted. Cardiac exam with regular rhythm normal S1 and S2. No murmur or gallop noted. Lungs are clear to auscultation. Abdomen is symmetrical. Bowel sounds are normal. On palpation abdomen is soft and nontender without organomegaly or masses. Rectal examination reveals some discomfort on digital exam but no stricture noted. Large amount of formed soft stool noted in the rectum. It was digitally broken up. Extremities are thin but no clubbing or edema noted. [place] nodes noted at distal interphalangeal joints.   Lab Results:  Recent Labs  11/17/15 1435 11/18/15 0655  WBC 7.9 15.3*  HGB 12.8* 14.2  HCT 37.8* 41.7  PLT 231 271   BMET  Recent Labs  11/17/15 1435 11/18/15 0655  NA 134* 135  K 3.8 3.8  CL  95* 97*  CO2 30 27  GLUCOSE 93 101*  BUN 20 19  CREATININE 0.89 0.73  CALCIUM 9.4 9.3   LFT  Recent Labs  11/17/15 1435  PROT 6.1*  ALBUMIN 3.6  AST 20  ALT 26  ALKPHOS 59  BILITOT 1.2   PT/INR  Recent Labs  11/17/15 1435  LABPROT 19.4*  INR 1.62   TSH 1.803  Studies/Results: Ct Abdomen Pelvis W Contrast  Result Date: 11/18/2015 CLINICAL DATA:  Admitted for orthostatics symptoms, elevation of troponin. EXAM: CT ABDOMEN AND PELVIS WITH CONTRAST TECHNIQUE: Multidetector CT imaging of the abdomen and pelvis was performed using the standard protocol following bolus administration of intravenous contrast. CONTRAST:  195mL ISOVUE-300 IOPAMIDOL (ISOVUE-300) INJECTION 61% COMPARISON:  None. FINDINGS: Lower chest: Mild scarring/atelectasis at each lung base.  Trace pleural effusions at each lung base. Hepatobiliary: Liver and gallbladder appear normal. Pancreas: No mass, inflammatory changes, or other significant abnormality. Spleen: Within normal limits in size and appearance. Adrenals/Urinary Tract: Right kidney appears normal without mass, stone or hydronephrosis. Multiple hypodense masses are seen within the kidneys, largest located in the lower and superior poles compatible with benign cysts by CT density measurements. An additional smaller lesion exophytic to the posterior cortex of the right kidney, measuring 1 x 1 cm, is too small to definitively characterize but not definitive for benign cyst by CT density measurement. Bladder walls appear thickened circumferentially, however, this may be due to the partial bladder decompression. Fluid stranding/inflammation noted in the anterior pelvis adjacent to the bladder, increasing my suspicion of cystitis. Stomach/Bowel: Large amount of stool within the rectosigmoid colon. Moderate amount of stool and gas throughout the remainder of the colon, suggesting constipation. Appendix is normal. No small bowel dilatation. No evidence of bowel wall  inflammation. Stomach appears normal. Vascular/Lymphatic: Scattered atherosclerotic changes of the normal caliber abdominal aorta and pelvic vasculature. No enlarged lymph nodes seen. Reproductive: No mass or other significant abnormality. Other: No free fluid or abscess collection. Mild fluid stranding/ inflammation adjacent to the bladder. Musculoskeletal: Degenerative changes throughout the scoliotic thoracolumbar spine. No acute or suspicious osseous finding. IMPRESSION: 1. Large amount of stool within the rectosigmoid colon, distending the rectal vault, suggesting fecal impaction. Moderate amount of stool and gas throughout the remainder of the colon suggesting associated constipation. 2. Circumferential thickening of the bladder walls, possibly accentuated to some degree by the partial bladder decompression, but suspicious for cystitis as there is fluid stranding/inflammation about the bladder. Consider correlation with urinalysis. 3. 1 cm hyperdense mass exophytic to the posterior cortex of the right kidney. This is too small to definitively characterize. I favor hemorrhagic cyst but would consider follow-up CT or MRI at some point to ensure benignity. Additional benign-appearing cysts within the upper and lower poles of the left kidney. 4. Small bilateral pleural effusions. 5. Aortic atherosclerosis. Electronically Signed   By: Franki Cabot M.D.   On: 11/18/2015 20:14  I have reviewed CT images.  Assessment; Patient is 82 year old Caucasian male who was hospitalized 3 days ago for orthostatic hypotension resulting in a fall. Patient found to have fecal impaction well outlined on abdominopelvic CT. He is having some results with mag citrate. Digital exam reveals persistent fecal impaction which was digitally broken off. Patient has chronic constipation with dependence on laxatives. He will need to be in a bowel regiment drop than when necessary use of laxatives in order to prevent future episodes of  fecal impaction. He has never been screened for CRC. Patient is interested in undergoing colonoscopy.   Recommendations;  Patient needs to be on bowel regimen or medications on schedule rather than on as-needed basis. Goal for him should be dead please have three bowel movements per week. Patient will need to be off Xarelto for two days prior to colonoscopy. Therefore procedure will be scheduled on an outpatient basis. Patient will need to be on high fiber diet. Start him on polyethylene glycol 17 g by mouth daily at bedtime. Mycolog-II cream to be applied to anal canal twice a day for 1 week.   LOS: 0 days   Najeeb Rehman  11/20/2015, 10:40 AM

## 2015-11-20 NOTE — Progress Notes (Signed)
[facility] PROGRESS NOTE  Zachary Arellano [id number] DOB: [birth year]    PCP:   Zachary Neighbors, MD   HPI:  Zachary B Hooperis an 82 y.o.malewith hx of chronic afib, HTN, prior CVA, glaucoma, frequent falls, admitted for orthostatic symptomology, modest elevation of troponin above the normal range, no CP/SOB. He complained to me of tenesmus, and never had a colonoscopy previously. He has been taking laxatives. He was admitted and his beta blocker reduced, clonidine discontinued, and his orthostatic symptomology improved.  He was able to ambulate without significant problem.  Nevertheless, family and patient would like to go to STR.  PT evaluation was requested.  PT noted that though he is at risk for falls, he did not qualify for STR. For his tenesmus, I was concerned about colon maligancy, and family had requested Dr Dereck Leep to see him.  While waiting for the consultation, I obtained an abdominal pelvic CT which showed obstipation, along with bladder wall thickening, and a right exophylic renal mass needing follow up.  Family and patient was told of this.  He was also noted to have memory problem, raising possibility of dementia as well.  Today, he was more confused, and we noted that he was having more coughs.   ABG was done which showed 7.48/37/pOx 59, 91% at RA.  Per Dr Dereck Leep, he has fecal impaction.    Rewiew of Systems:  Constitutional: Negative for malaise, fever and chills. No significant weight loss or weight gain Eyes: Negative for eye pain, redness and discharge, diplopia, visual changes, or flashes of light. ENMT: Negative for ear pain, hoarseness, nasal congestion, sinus pressure and sore throat. No headaches; tinnitus, drooling, or problem swallowing. Cardiovascular: Negative for chest pain, palpitations, diaphoresis, dyspnea and peripheral edema. ; No orthopnea, PND Respiratory: Negative for cough, hemoptysis, wheezing and stridor. No pleuritic chestpain. Gastrointestinal: Negative  for nausea, vomiting, diarrhea, constipation, abdominal pain, melena, blood in stool, hematemesis, jaundice and rectal bleeding.    Genitourinary: Negative for frequency, dysuria, incontinence,flank pain and hematuria; Musculoskeletal: Negative for back pain and neck pain. Negative for swelling and trauma.;  Skin: . Negative for pruritus, rash, abrasions, bruising and skin lesion.; ulcerations Neuro: Negative for headache, lightheadedness and neck stiffness. Negative for weakness, altered level of consciousness , altered mental status, extremity weakness, burning feet, involuntary movement, seizure and syncope.  Psych: negative for anxiety, depression, insomnia, tearfulness, panic attacks, hallucinations, paranoia, suicidal or homicidal ideation    Past Medical History:  Diagnosis Date  . Chronic atrial fibrillation (HCC)    2D echo  09/23/2006 LA and right atrium severely dilated, moderate pulmonary hypertension  . Glaucoma   . Hypertension   . Rib fracture   . Stroke (cerebrum) (HCC)    Left cerebellum.    Past Surgical History:  Procedure Laterality Date  . CATARACT EXTRACTION    . skin cancer removal      Medications:  HOME MEDS: Prior to Admission medications   Medication Sig Start Date End Date Taking? Authorizing Provider  acetaZOLAMIDE (DIAMOX) 250 MG tablet Take 250 mg by mouth 4 (four) times daily. 10/25/15  Yes Historical Provider, MD  atenolol (TENORMIN) 50 MG tablet Take 1 tablet (50 mg total) by mouth daily. 11/06/15  Yes Donne Hazel, MD  B Complex-C (B-COMPLEX WITH VITAMIN C) tablet Take 1 tablet by mouth daily.   Yes Historical Provider, MD  bimatoprost (LUMIGAN) 0.03 % ophthalmic solution Place 1 drop into both eyes daily.   Yes Historical Provider, MD  brimonidine-timolol (  COMBIGAN) 0.2-0.5 % ophthalmic solution Place 1 drop into both eyes 2 (two) times daily.   Yes Historical Provider, MD  docusate sodium (COLACE) 100 MG capsule Take 1 capsule (100 mg total) by  mouth daily. 11/06/15  Yes Donne Hazel, MD  Multiple Vitamin (MULTIVITAMIN WITH MINERALS) TABS tablet Take 1 tablet by mouth daily.   Yes Historical Provider, MD  Multiple Vitamins-Minerals (VISION FORMULA EYE HEALTH) CAPS Take 1 capsule by mouth daily.   Yes Historical Provider, MD  pilocarpine (PILOCAR) 2 % ophthalmic solution Place 1 drop into both eyes 4 (four) times daily.  10/25/15  Yes Historical Provider, MD  Polyethyl Glycol-Propyl Glycol (SYSTANE) 0.4-0.3 % SOLN Apply 2 drops to eye 2 (two) times daily.   Yes Historical Provider, MD  XARELTO 20 MG TABS tablet TAKE ONE (1) TABLET EACH DAY 04/13/15  Yes Pixie Casino, MD  cloNIDine (CATAPRES) 0.1 MG tablet Take 1 tablet (0.1 mg total) by mouth 2 (two) times daily. Patient not taking: Reported on 11/17/2015 11/06/15   Donne Hazel, MD  diltiazem (CARDIZEM CD) 120 MG 24 hr capsule Take 1 capsule (120 mg total) by mouth daily. 11/17/15   Nat Christen, MD     Allergies:  No Known Allergies  Social History:   reports that he has quit smoking. He has quit using smokeless tobacco. He reports that he drinks about 6.0 oz of alcohol per week . He reports that he does not use drugs.  Family History: Family History  Problem Relation Age of Onset  . Cancer Mother   . Heart disease Father   . Emphysema Father   . Heart disease Brother      Physical Exam: Vitals:   11/19/15 2151 11/20/15 0628 11/20/15 1230 11/20/15 1351  BP: 132/74 (!) 155/93  (!) 153/93  Pulse: (!) 114 (!) 102  (!) 109  Resp: 20 20  20   Temp: 98.6 F (37 C) 97.9 F (36.6 C)  97.9 F (36.6 C)  TempSrc: Oral Oral  Oral  SpO2: 97% 95% 91%   Weight:       Blood pressure (!) 153/93, pulse (!) 109, temperature 97.9 F (36.6 C), temperature source Oral, resp. rate 20, weight 67.4 kg (148 lb 11.2 oz), SpO2 91 %.  GEN:  Pleasant patient lying in the stretcher in no acute distress; cooperative with exam. PSYCH:  alert and oriented x4; does not appear anxious or depressed;  affect is appropriate. HEENT: Mucous membranes pink and anicteric; PERRLA; EOM intact; no cervical lymphadenopathy nor thyromegaly or carotid bruit; no JVD; There were no stridor. Neck is very supple. Breasts:: Not examined CHEST WALL: No tenderness CHEST: Normal respiration, clear to auscultation bilaterally.  HEART: Regular rate and rhythm.  There are no murmur, rub, or gallops.   BACK: No kyphosis or scoliosis; no CVA tenderness ABDOMEN: soft and non-tender; no masses, no organomegaly, normal abdominal bowel sounds; no pannus; no intertriginous candida. There is no rebound and no distention. Rectal Exam: Not done EXTREMITIES: No bone or joint deformity; age-appropriate arthropathy of the hands and knees; no edema; no ulcerations.  There is no calf tenderness. Genitalia: not examined PULSES: 2+ and symmetric SKIN: Normal hydration no rash or ulceration CNS: Cranial nerves 2-12 grossly intact no focal lateralizing neurologic deficit.  Speech is fluent; uvula elevated with phonation, facial symmetry and tongue midline. DTR are normal bilaterally, cerebella exam is intact, barbinski is negative and strengths are equaled bilaterally.  No sensory loss.   Labs on Admission:  Basic Metabolic Panel:  Recent Labs Lab 11/17/15 1435 11/18/15 0655  NA 134* 135  K 3.8 3.8  CL 95* 97*  CO2 30 27  GLUCOSE 93 101*  BUN 20 19  CREATININE 0.89 0.73  CALCIUM 9.4 9.3   Liver Function Tests:  Recent Labs Lab 11/17/15 1435  AST 20  ALT 26  ALKPHOS 59  BILITOT 1.2  PROT 6.1*  ALBUMIN 3.6   CBC:  Recent Labs Lab 11/17/15 1435 11/18/15 0655  WBC 7.9 15.3*  NEUTROABS 5.4  --   HGB 12.8* 14.2  HCT 37.8* 41.7  MCV 97.9 97.7  PLT 231 271   Cardiac Enzymes:  Recent Labs Lab 11/17/15 1435 11/17/15 1712 11/17/15 2110 11/18/15 0242 11/18/15 1117  TROPONINI 0.05* 0.04* 0.04* 0.03* 0.03*    CBG: No results for input(s): GLUCAP in the last 168 hours.   Radiological Exams on  Admission: Dg Chest 2 View  Result Date: 11/20/2015 CLINICAL DATA:  Pneumonia followup EXAM: CHEST  2 VIEW COMPARISON:  11/17/2015. FINDINGS: Mild cardiac enlargement. Aortic atherosclerosis is noted. There is a a opacity in the posterior left base compatible with pneumonia. IMPRESSION: Left base pneumonia. Aortic atherosclerosis. Electronically Signed   By: Kerby Moors M.D.   On: 11/20/2015 15:34   Ct Abdomen Pelvis W Contrast  Result Date: 11/18/2015 CLINICAL DATA:  Admitted for orthostatics symptoms, elevation of troponin. EXAM: CT ABDOMEN AND PELVIS WITH CONTRAST TECHNIQUE: Multidetector CT imaging of the abdomen and pelvis was performed using the standard protocol following bolus administration of intravenous contrast. CONTRAST:  174mL ISOVUE-300 IOPAMIDOL (ISOVUE-300) INJECTION 61% COMPARISON:  None. FINDINGS: Lower chest: Mild scarring/atelectasis at each lung base. Trace pleural effusions at each lung base. Hepatobiliary: Liver and gallbladder appear normal. Pancreas: No mass, inflammatory changes, or other significant abnormality. Spleen: Within normal limits in size and appearance. Adrenals/Urinary Tract: Right kidney appears normal without mass, stone or hydronephrosis. Multiple hypodense masses are seen within the kidneys, largest located in the lower and superior poles compatible with benign cysts by CT density measurements. An additional smaller lesion exophytic to the posterior cortex of the right kidney, measuring 1 x 1 cm, is too small to definitively characterize but not definitive for benign cyst by CT density measurement. Bladder walls appear thickened circumferentially, however, this may be due to the partial bladder decompression. Fluid stranding/inflammation noted in the anterior pelvis adjacent to the bladder, increasing my suspicion of cystitis. Stomach/Bowel: Large amount of stool within the rectosigmoid colon. Moderate amount of stool and gas throughout the remainder of the colon,  suggesting constipation. Appendix is normal. No small bowel dilatation. No evidence of bowel wall inflammation. Stomach appears normal. Vascular/Lymphatic: Scattered atherosclerotic changes of the normal caliber abdominal aorta and pelvic vasculature. No enlarged lymph nodes seen. Reproductive: No mass or other significant abnormality. Other: No free fluid or abscess collection. Mild fluid stranding/ inflammation adjacent to the bladder. Musculoskeletal: Degenerative changes throughout the scoliotic thoracolumbar spine. No acute or suspicious osseous finding. IMPRESSION: 1. Large amount of stool within the rectosigmoid colon, distending the rectal vault, suggesting fecal impaction. Moderate amount of stool and gas throughout the remainder of the colon suggesting associated constipation. 2. Circumferential thickening of the bladder walls, possibly accentuated to some degree by the partial bladder decompression, but suspicious for cystitis as there is fluid stranding/inflammation about the bladder. Consider correlation with urinalysis. 3. 1 cm hyperdense mass exophytic to the posterior cortex of the right kidney. This is too small to definitively characterize. I favor hemorrhagic cyst  but would consider follow-up CT or MRI at some point to ensure benignity. Additional benign-appearing cysts within the upper and lower poles of the left kidney. 4. Small bilateral pleural effusions. 5. Aortic atherosclerosis. Electronically Signed   By: Franki Cabot M.D.   On: 11/18/2015 20:14   Assessment/Plan Present on Admission: . Orthostatic hypotension . Atrial fibrillation ([place]) . Tachycardia-bradycardia syndrome ([place]) . Chronic diastolic CHF (congestive heart failure) ([place]) . Essential hypertension . HCAP (healthcare-associated pneumonia)  PLAN:  Orthostasis:  Continue with current BP regimen.  He is doing better.  Will give small amount of IVF, noting hx recent admission for CHF.  Fecal impaction, with hx of  laxative use.  Need to exclude colon CA.  Appreciate Dr Ewell Poe help.  Bowel regimen recommended was started.  See GI's note.  Plan to d/c Xarelto and proceed with colonoscopy Wednesday.   Afib:  Hold anticoagulation in anticipation for colonoscopy.  Rate is controlled with CCB.  HCAP:  Not certain, but CXR was suggestive, and now he has cough and hypoxia.  Will start IV Zosyn and repeat CXR.  Confusion:  Suspect he has dementia.  Work up included normal B12, negative RPR, negative HIV.  Defer Tx to PCP.   Other plans as per orders. Code Status: FULL Haskel Khan, MD.  FACP [facility] Pager [phone] 7pm to 7am.  11/20/2015, 3:51 PM

## 2015-11-20 NOTE — Progress Notes (Signed)
Patient incontinent with large BM this morning

## 2015-11-20 NOTE — Progress Notes (Signed)
Hallucinations reported to me by PT.  Patient as been very confused at time this morning as well.  Reported to Dr. Marin Comment

## 2015-11-20 NOTE — Evaluation (Addendum)
Physical Therapy Evaluation Patient Details Name: Zachary Arellano MRN: [id number] DOB: 02/08/1932 Today's Date: 11/20/2015   History of Present Illness  82 yo M admitted 11/17/2015 s/p fall after getting dizzy upon standing and weak.  Pt also c/o of tenesmus and abdominal pelvic CT which showed obstipation, along with bladder wall thickening, and a right exophylic renal mass needing follow up.  Pt also with some confusion - dementia?  PMH: recent admission for fall 10/30/2015  and also found to have acute CHF that admission.  He was d/c home with HHPT because he was ambulating 54ft with RW and Mod I and negotiated 10 steps.  chronic Afib, moderate pulmonary HTN, HTN, rib fx, L cerebellum CVA, rib fx, last admission acute CHF.   Clinical Impression  Pt received in bed, wife and one son present, and pt is agreeable to PT evaluation.  Wife reports that he has not been moving around since his last admission because he keeps falling, however it is noted that on 11/06/2015 he ambulated 529ft and negotiated 10steps at Mod (I) level with myself for PT.  Wife reports that when home health came out to see him, he wasn't feeling well, and turned them away.  Today during PT, he demonstrates need for Min A for supine<>sit due to poor ability to sequence how to complete the transfer, and Min guard for transfers sit<>stand and ambulation x 27ft with RW.  His greatest deficits seem to be cognitive related.  He had 2 separate visual hallucinations: 1) saw a woman in the corner with a gun, and 2) saw a poodle in the room.  Pt also had some difficulty with finding his way back to the room.   Pt did not c/o dizziness at any point during PT evaluation and orthostatic vitals were taken and were as follows:  Supine:153/95, HR: 115bpm Sitting: 128/97, HR: 109bpm Standing: 126/75, HR: 115bpm Discussed briefly the option of an ALF for both the pt and his wife as she was recently admitted after she sustained a fall and hit her head.   Wife adamantly stated "That's not for me, because I can still get around."     Follow Up Recommendations Supervision/Assistance - 24 hour (ALF), HHPT    Equipment Recommendations  None recommended by PT    Recommendations for Other Services       Precautions / Restrictions Precautions Precautions: Fall Precaution Comments: Reason for admission, and previous admission for same.  Wife reports that he passes out and falls out of the bed.  Restrictions Weight Bearing Restrictions: No      Mobility  Bed Mobility Overal bed mobility: Needs Assistance Bed Mobility: Supine to Sit     Supine to sit: Min assist (To sequence how to get up OOB - pt required assistance to lift trunk up, then became confused and got back in the bed, and tried to get out the other side. ) Sit to supine: Min assist (To lift LE's up into the bed, and to shift shoulders over to straighten out in the bed. )      Transfers Overall transfer level: Needs assistance Equipment used: Rolling walker (2 wheeled) Transfers: Sit to/from Stand Sit to Stand: Min guard         General transfer comment: Continued vc's for hand placement.  Pt requires assistance for safety when going into the bathroom and using the RW.  Pt wants to park it out side the bathroom instead of using it to get to the commode.  Ambulation/Gait Ambulation/Gait assistance: Min guard;Supervision Ambulation Distance (Feet): 200 Feet Assistive device: Rolling walker (2 wheeled) Gait Pattern/deviations: Step-through pattern   Gait velocity interpretation: <1.8 ft/sec, indicative of risk for recurrent falls General Gait Details: Pt noted to be slightly confused, - needed multiple vc's for finding his room,  Stairs            Wheelchair Mobility    Modified Rankin (Stroke Patients Only)       Balance Overall balance assessment: Needs assistance Sitting-balance support: Bilateral upper extremity supported       Standing balance  support: Bilateral upper extremity supported Standing balance-Leahy Scale: Fair                               Pertinent Vitals/Pain Pain Assessment: No/denies pain    Home Living Family/patient expects to be discharged to:: Private residence Living Arrangements: Spouse/significant other Available Help at Discharge: Family;Home health (Per wife, pt did not feel well enough to participate in the home health since his discharge. ) Type of Home: House Home Access: Stairs to enter   [employer] of Steps: 1 step at the back door.   Home Layout: Two level (Pt has a main level bedroom set up at this time. ) Home Equipment: Gilford Rile - 2 wheels      Prior Function     Gait / Transfers Assistance Needed: Pt and family confirm that pt was independent with ambulation.   ADL's / Homemaking Assistance Needed: independent with dressing and bathing.         Hand Dominance   Dominant Hand: Right    Extremity/Trunk Assessment   Upper Extremity Assessment: Generalized weakness           Lower Extremity Assessment: Generalized weakness         Communication      Cognition Arousal/Alertness: Awake/alert Behavior During Therapy: Flat affect Overall Cognitive Status: Impaired/Different from baseline Area of Impairment: Following commands;Safety/judgement     Memory: Decreased short-term memory   Safety/Judgement: Decreased awareness of safety;Decreased awareness of deficits     General Comments: Repetition for hand placement with transfers.  Pt also noted to have 2 visual hallucinations while working with PT.  Pt saw a woman in the corner of the room with a gun, and then later during evaluation he saw a poodle in the room.  Both of which were not actually in the room.     General Comments      Exercises        Assessment/Plan    PT Assessment Patient needs continued PT services  PT Diagnosis Generalized weakness;Abnormality of gait;Difficulty  walking   PT Problem List Decreased strength;Decreased range of motion;Decreased activity tolerance;Decreased balance;Decreased mobility;Decreased knowledge of use of DME;Decreased safety awareness;Decreased knowledge of precautions;Pain;Decreased cognition  PT Treatment Interventions DME instruction;Gait training;Functional mobility training;Therapeutic activities;Stair training;Therapeutic exercise;Balance training;Cognitive remediation;Patient/family education   PT Goals (Current goals can be found in the Care Plan section) Acute Rehab PT Goals Patient Stated Goal: Pt wants to build strength.  PT Goal Formulation: With patient/family Time For Goal Achievement: 12/04/15 Potential to Achieve Goals: Good    Frequency Min 3X/week   Barriers to discharge        Co-evaluation               End of Session Equipment Utilized During Treatment: Gait belt Activity Tolerance: Patient tolerated treatment well Patient left: in bed;with call bell/phone within reach;with  nursing/sitter in room Nurse Communication: Mobility status (Re: hallucinations)    Functional Assessment Tool Used: [employer] "6-clicks"  Functional Limitation: Mobility: Walking and moving around Mobility: Walking and Moving Around Current Status [phone]): At least 40 percent but less than 60 percent impaired, limited or restricted Mobility: Walking and Moving Around Goal Status [phone]): At least 20 percent but less than 40 percent impaired, limited or restricted    Time: 1121-1151 PT Time Calculation (min) (ACUTE ONLY): 30 min   Charges:   PT Evaluation $PT Eval Moderate Complexity: 1 Procedure PT Treatments $Gait Training: 8-22 mins   PT G Codes:   PT G-Codes **NOT FOR INPATIENT CLASS** Functional Assessment Tool Used: [employer] "6-clicks"  Functional Limitation: Mobility: Walking and moving around Mobility: Walking and Moving Around Current Status [phone]): At least 40 percent but  less than 60 percent impaired, limited or restricted Mobility: Walking and Moving Around Goal Status [phone]): At least 20 percent but less than 40 percent impaired, limited or restricted   Beth Makira Holleman, PT, DPT X: [phone]

## 2015-11-20 NOTE — Care Management Note (Signed)
Case Management Note  Patient Details  Name: Zachary Arellano MRN: [id number] Date of Birth: April 04, 1932   Expected Discharge Date:                  Expected Discharge Plan:  [name]  In-House Referral:  Clinical Social Work  Discharge planning Services  CM Consult  Post Acute Care Choice:    Choice offered to:     DME Arranged:    DME Agency:     HH Arranged:    [place] Agency:     Status of Service:  In process, will continue to follow  If discussed at Long Length of Stay Meetings, dates discussed:    Additional Comments: Pt is from home with [facility] services through [facility]. Pt's wife states she is unable to care for pt and that he falls frequently at home. PT has recommended 24/7 supervision but pt ambulated in hallway and does not meet need for STR. This was explained to pts wife with the option of 1)going home and resuming [name] services 2) paying [facility] for ALF 3) paying [facility] for SNF. Pt's wife states 'her only real option' is to take him home as she will not pay [facility]. Pt's son Lynelle Smoke) states they will pay [facility] if they have to. He has requested a second option from PT. Will hallucinating and found to be hypoxic on ABG, started on abx. CSW is aware of discussion and will discuss paying privately with family. Will cont to follow.  Sherald Barge, RN 11/20/2015, 3:57 PM

## 2015-11-20 NOTE — Progress Notes (Signed)
Pharmacy Antibiotic Note  Zachary Arellano is a 82 y.o. male admitted on 11/17/2015 after fall.  Pharmacy has been consulted for zosyn dosing.  Plan: Zosyn 3.375g IV q8h (4 hour infusion).  Weight: 148 lb 11.2 oz (67.4 kg)  Temp (24hrs), Avg:98.3 F (36.8 C), Min:97.9 F (36.6 C), Max:98.6 F (37 C)   Recent Labs Lab 11/17/15 1435 11/18/15 0655  WBC 7.9 15.3*  CREATININE 0.89 0.73    Estimated Creatinine Clearance: 68 mL/min (by C-G formula based on SCr of 0.8 mg/dL).    No Known Allergies  Antimicrobials this admission: zosyn 8/7 >>      Thank you for allowing pharmacy to be a part of this patient's care.  Excell Seltzer Poteet 11/20/2015 1:17 PM

## 2015-11-20 NOTE — Progress Notes (Signed)
Patient and family want him to undergo colonoscopy before discharge. Dr. Marin Comment feels he should be ready by the middle of the week. Will hold Xarelto. Possible colonoscopy on 11/22/2015.

## 2015-11-21 MED ORDER — PEG 3350-KCL-NA BICARB-NACL 420 G PO SOLR
4000.0000 mL | Freq: Once | ORAL | Status: AC
  Administered 2015-11-21: 4000 mL via ORAL
  Filled 2015-11-21: qty 4000

## 2015-11-21 MED ORDER — LORAZEPAM 2 MG/ML IJ SOLN
0.5000 mg | Freq: Once | INTRAMUSCULAR | Status: AC
  Administered 2015-11-21: 0.5 mg via INTRAVENOUS
  Filled 2015-11-21: qty 1

## 2015-11-21 NOTE — Progress Notes (Addendum)
PT Cancellation Note  Patient Details Name: Zachary Arellano MRN: [id number] DOB: 12/30/1931   Cancelled Treatment:    Reason Eval/Treat Not Completed: Patient declined, no reason specified. PT came by room for treatment session and to reassess DC planning recommendations. Upon arrival visitor in room was preparing to conclude visit and exit. Patient is oriented to person and place, however his orientation to time is inconsistent; in response to my questions he reports that it is August 8th, 2018, and that it is Winter. Obtaining SaO2 is difficult, but eventually is WNL, however HR data from oximeter is in 140's, and when repeated manually at R radial artery, pulse is very weak, difficult to palpate, but certainly [id number] and irregular. The pt was initially very pleasant, but deflects all attempts to initiate OOB mobility; each time, he offered a different rational for deferring treatment each time in spite of encouragement. He continually insists that he has had a bowel movement and needs to have his diaper changed, to which PT visualizes that the pt is not wearing a diaper and no visual/olfactory evidence of BM can be found; I explain this to him, but he continues to insist that he needs to be cleaned. After additional encouragement for participation, the patient becomes agitated and reports 'he does not have to explain himself' and that the PT is 'being aggravating' and should leave the room. PT apologizes to the patient for any unintentional distress and exits. Sitter in hallway reports that she overheard the patient's children telling him to not AMB with PT today, but is unable to offer any explanation.   Discused with evaluating PT who reports cognition has progressively become more questionable within this admission, and compared to prior admission. Recommending Speech Therapy consult to evaluate the pt for any possible cognitive deficits, especially in light of hallucinations mentioned in EMR and  questionable memory impairment in physician note.   I am unable to make any new discharge recommendations on today's visit, however based on documentation of objective measures from this admission while working with PT, the patient seems an unlikely candidate for STR/SNF, a setting typically appropriate for those with severe mobility difficulty and/or need for high volume/high frequency rehabilitation services. Granted the pt was accessing the community within the last month, and walking limited community distances with the last two weeks while admitted, I would anticipate a rapid return to PLOF with outpatient PT, or HHPT should his ability to AMB suddenly decline.  3:53 PM, 11/21/15 Etta Grandchild, PT, DPT Physical Therapist at [facility] [phone] (office)

## 2015-11-21 NOTE — NC FL2 (Signed)
[place] MEDICAID FL2 LEVEL OF CARE SCREENING TOOL     IDENTIFICATION  Patient Name: DMITRY RUTTER Birthdate: 1931/08/29 Sex: male Admission Date (Current Location): 11/17/2015  [facility] and [place] Number:  [employer] and Address:  [facility] [address], [place]      Provider Number: [id number]  Attending Physician Name and Address:  Orvan Falconer, MD  Relative Name and Phone Number:       Current Level of Care: Hospital Recommended Level of Care: [place] Prior Approval Number:    Date Approved/Denied:   PASRR Number: [id number] ([id number])  Discharge Plan: SNF    Current Diagnoses: Patient Active Problem List   Diagnosis Date Noted  . HCAP (healthcare-associated pneumonia) 11/20/2015  . Hypotension 11/20/2015  . Orthostatic hypotension 11/17/2015  . Chronic diastolic CHF (congestive heart failure) ([place]) 11/17/2015  . Tachycardia-bradycardia syndrome ([place])   . Acute diastolic heart failure ([place])   . General weakness   . Rib contusion   . Hyponatremia 10/30/2015  . Hypokalemia 10/30/2015  . Symptomatic bradycardia   . Current use of long term anticoagulation 10/14/2013  . Atrial fibrillation ([place]) 09/08/2012  . Essential hypertension 09/08/2012    Orientation RESPIRATION BLADDER Height & Weight     Self  Normal Incontinent Weight: 148 lb 11.2 oz (67.4 kg) Height:  5\' 8"  (172.7 cm)  BEHAVIORAL SYMPTOMS/MOOD NEUROLOGICAL BOWEL NUTRITION STATUS      Incontinent Diet (See discharge summary)  AMBULATORY STATUS COMMUNICATION OF NEEDS Skin   Supervision Verbally Normal                       Personal Care Assistance Level of Assistance  Bathing, Feeding, Dressing Bathing Assistance: Limited assistance Feeding assistance: Limited assistance Dressing Assistance: Limited assistance     Functional Limitations Info  Sight, Hearing, Speech Sight Info: Adequate Hearing Info: Adequate Speech Info:  Adequate    SPECIAL CARE FACTORS FREQUENCY  PT (By licensed PT)                    Contractures      Additional Factors Info  Code Status Code Status Info: Full Code             Current Medications (11/21/2015):  This is the current hospital active medication list Current Facility-Administered Medications  Medication Dose Route Frequency Provider Last Rate Last Dose  . atenolol (TENORMIN) tablet 25 mg  25 mg Oral Daily Phillips Grout, MD   25 mg at 11/21/15 0910  . guaiFENesin (ROBITUSSIN) 100 MG/5ML solution 100 mg  5 mL Oral Q4H PRN Phillips Grout, MD   100 mg at 11/18/15 0505  . lactulose (CHRONULAC) 10 GM/15ML solution 10 g  10 g Oral Daily Orvan Falconer, MD   10 g at 11/21/15 0910  . nystatin-triamcinolone (MYCOLOG II) cream   Topical BID Rogene Houston, MD      . nystatin-triamcinolone [facility] II) cream   Topical BID Orvan Falconer, MD      . piperacillin-tazobactam (ZOSYN) IVPB 3.375 g  3.375 g Intravenous Q8H Orvan Falconer, MD   3.375 g at 11/21/15 1327  . polyethylene glycol (MIRALAX / GLYCOLAX) packet 17 g  17 g Oral Daily Orvan Falconer, MD   17 g at 11/20/15 1421  . sodium chloride flush (NS) 0.9 % injection 3 mL  3 mL Intravenous Q12H Phillips Grout, MD   3 mL at 11/21/15 [id number]  Discharge Medications: Please see discharge summary for a list of discharge medications.  Relevant Imaging Results:  Relevant Lab Results:   Additional Information PT RECOMMENDS HHPT. FAMILY REQUESTED ANOTHER PT EVAL WHICH WILL OCCUR TODAY.  FAMILY WILL PRIVATE PAY IF THAT PT EVAL DOES NOT RECOMMEND SNF. THEY ALSO MAY CONSIDER ALF  Blain Hunsucker D, LCSW

## 2015-11-21 NOTE — Consult Note (Signed)
   [facility] CM Inpatient Consult   11/21/2015  Zachary Arellano 08/08/1931 [id number]  Spoke with patient, wife, 2 sons and a daughter-in-law at bedside. [place] services. Patient family is hoping that patient will be able to qualify for [facility] upon discharge, at this point declining to sign up with [facility].  RNCM left program brochure and contact for any future needs.  Of note, [facility] Care Management services does not replace or interfere with any services that are arranged by inpatient case management or social work.  For additional questions or referrals please contact: Royetta Crochet. Zachary Purser, RN, BSN, [place] [facility] Liaison [phone]

## 2015-11-21 NOTE — Progress Notes (Signed)
Patient denies shortness of breath or abdominal pain. According to consider he did have an episode of urinary incontinence. He was not able to make the bathroom. Patient is alert and cooperative but he is confused. He wonders if his family knows where he is. Abdominal examination is within normal limits.  Will prep patient with GoLYTELY afternoon for colonoscopy to be performed tomorrow. Hopefully he can tolerate prep otherwise procedure may have to be postponed.

## 2015-11-21 NOTE — Progress Notes (Signed)
[facility] PROGRESS NOTE  Zachary Arellano [id number] DOB: 1932-03-21    PCP:   [name], MD   HPI:  Zachary B Hooperis an 82 y.o.malewith hx of chronic afib, HTN, prior CVA, glaucoma, frequent falls, admitted for orthostatic symptomology, modest elevation of troponin above the normal range, no CP/SOB. He complained to me of tenesmus, and never had a colonoscopy previously. He has been taking laxatives. He was admitted and his beta blocker reduced, clonidine discontinued, and his orthostatic symptomology improved. He was able to ambulate without significant problem. Nevertheless, family and patient would like to go to STR. PT evaluation was requested.  PT noted that though he is at risk for falls, but he did not qualify for STR. For his tenesmus, I was concerned about colon malignancy, and family had requested Dr Dereck Leep to see him. While waiting for the consultation, I obtained an abdominal pelvic CT which showed obstipation, along with bladder wall thickening, and a right exophylic renal mass needing follow up. Family and patient was told of these finding. He was also noted to have memory problem, raising possibility of dementia as well. Some work up for dementia including TSH, HIV, B12, RPR were negative. Being in the hospital, he has intermittent confusion, and was found to have PNA, confirmed with chest xray. ABG was done which showed 7.48/37/pOx 59, 91% at RA.   Dr Dereck Leep saw him, and made recommendation for bowel regimen, d/c Xarelto, and planned for colonoscopy on Wednesday.    Rewiew of Systems:  Constitutional: Negative for malaise, fever and chills. No significant weight loss or weight gain Eyes: Negative for eye pain, redness and discharge, diplopia, visual changes, or flashes of light. ENMT: Negative for ear pain, hoarseness, nasal congestion, sinus pressure and sore throat. No headaches; tinnitus, drooling, or problem swallowing. Cardiovascular: Negative for chest pain,  palpitations, diaphoresis, dyspnea and peripheral edema. ; No orthopnea, PND Respiratory: Negative for  hemoptysis, wheezing and stridor. No pleuritic chestpain. Gastrointestinal: Negative for nausea, vomiting, diarrhea, constipation, abdominal pain, melena, blood in stool, hematemesis, jaundice and rectal bleeding.    Genitourinary: Negative for frequency, dysuria, incontinence,flank pain and hematuria; Musculoskeletal: Negative for back pain and neck pain. Negative for swelling and trauma.;  Skin: . Negative for pruritus, rash, abrasions, bruising and skin lesion.; ulcerations Neuro: Negative for headache, lightheadedness and neck stiffness. Negative for weakness,, extremity weakness, burning feet, involuntary movement, seizure and syncope.  Psych: negative for anxiety, depression, insomnia, tearfulness, panic attacks, hallucinations, paranoia, suicidal or homicidal ideation    Past Medical History:  Diagnosis Date  . Chronic atrial fibrillation (HCC)    2D echo  09/23/2006 LA and right atrium severely dilated, moderate pulmonary hypertension  . Glaucoma   . Hypertension   . Rib fracture   . Stroke (cerebrum) (HCC)    Left cerebellum.    Past Surgical History:  Procedure Laterality Date  . CATARACT EXTRACTION    . skin cancer removal      Medications:  HOME MEDS: Prior to Admission medications   Medication Sig Start Date End Date Taking? Authorizing Provider  acetaZOLAMIDE (DIAMOX) 250 MG tablet Take 250 mg by mouth 4 (four) times daily. 10/25/15  Yes Historical Provider, MD  atenolol (TENORMIN) 50 MG tablet Take 1 tablet (50 mg total) by mouth daily. 11/06/15  Yes Donne Hazel, MD  B Complex-C (B-COMPLEX WITH VITAMIN C) tablet Take 1 tablet by mouth daily.   Yes Historical Provider, MD  bimatoprost (LUMIGAN) 0.03 % ophthalmic solution Place 1  drop into both eyes daily.   Yes Historical Provider, MD  brimonidine-timolol (COMBIGAN) 0.2-0.5 % ophthalmic solution Place 1 drop into  both eyes 2 (two) times daily.   Yes Historical Provider, MD  docusate sodium (COLACE) 100 MG capsule Take 1 capsule (100 mg total) by mouth daily. 11/06/15  Yes Donne Hazel, MD  Multiple Vitamin (MULTIVITAMIN WITH MINERALS) TABS tablet Take 1 tablet by mouth daily.   Yes Historical Provider, MD  Multiple Vitamins-Minerals (VISION FORMULA EYE HEALTH) CAPS Take 1 capsule by mouth daily.   Yes Historical Provider, MD  pilocarpine (PILOCAR) 2 % ophthalmic solution Place 1 drop into both eyes 4 (four) times daily.  10/25/15  Yes Historical Provider, MD  Polyethyl Glycol-Propyl Glycol (SYSTANE) 0.4-0.3 % SOLN Apply 2 drops to eye 2 (two) times daily.   Yes Historical Provider, MD  XARELTO 20 MG TABS tablet TAKE ONE (1) TABLET EACH DAY 04/13/15  Yes Pixie Casino, MD  cloNIDine (CATAPRES) 0.1 MG tablet Take 1 tablet (0.1 mg total) by mouth 2 (two) times daily. Patient not taking: Reported on 11/17/2015 11/06/15   Donne Hazel, MD  diltiazem (CARDIZEM CD) 120 MG 24 hr capsule Take 1 capsule (120 mg total) by mouth daily. 11/17/15   Nat Christen, MD     Allergies:  No Known Allergies  Social History:   reports that he has quit smoking. He has quit using smokeless tobacco. He reports that he drinks about 6.0 oz of alcohol per week . He reports that he does not use drugs.  Family History: Family History  Problem Relation Age of Onset  . Cancer Mother   . Heart disease Father   . Emphysema Father   . Heart disease Brother      Physical Exam: Vitals:   11/20/15 1351 11/20/15 2252 11/21/15 0100 11/21/15 0714  BP: (!) 153/93 (!) 162/107  (!) 143/92  Pulse: (!) 109 (!) 101  (!) 57  Resp: 20 20  20   Temp: 97.9 F (36.6 C) 98.2 F (36.8 C)  97.7 F (36.5 C)  TempSrc: Oral Oral  Oral  SpO2:  92%  99%  Weight:   67.4 kg (148 lb 11.2 oz)   Height:   5\' 8"  (1.727 m)    Blood pressure (!) 143/92, pulse (!) 57, temperature 97.7 F (36.5 C), temperature source Oral, resp. rate 20, height 5\' 8"   (1.727 m), weight 67.4 kg (148 lb 11.2 oz), SpO2 99 %.  GEN:  Pleasant  patient lying in the stretcher in no acute distress; cooperative with exam. PSYCH:  alert and oriented x4, but he does have intermittent confusion. affect is appropriate. HEENT: Mucous membranes pink and anicteric; PERRLA; EOM intact; no cervical lymphadenopathy nor thyromegaly or carotid bruit; no JVD; There were no stridor. Neck is very supple. Breasts:: Not examined CHEST WALL: No tenderness CHEST: Normal respiration, clear to auscultation bilaterally.  HEART: Regular rate and rhythm.  There are no murmur, rub, or gallops.   BACK: No kyphosis or scoliosis; no CVA tenderness ABDOMEN: soft and non-tender; no masses, no organomegaly, normal abdominal bowel sounds; no pannus; no intertriginous candida. There is no rebound and no distention. Rectal Exam: Not done EXTREMITIES: No bone or joint deformity; age-appropriate arthropathy of the hands and knees; no edema; no ulcerations.  There is no calf tenderness. Genitalia: not examined PULSES: 2+ and symmetric SKIN: Normal hydration no rash or ulceration CNS: Cranial nerves 2-12 grossly intact no focal lateralizing neurologic deficit.  Speech is fluent;  uvula elevated with phonation, facial symmetry and tongue midline. DTR are normal bilaterally, cerebella exam is intact, barbinski is negative and strengths are equaled bilaterally.  No sensory loss.   Labs on Admission:  Basic Metabolic Panel:  Recent Labs Lab 11/17/15 1435 11/18/15 0655  NA 134* 135  K 3.8 3.8  CL 95* 97*  CO2 30 27  GLUCOSE 93 101*  BUN 20 19  CREATININE 0.89 0.73  CALCIUM 9.4 9.3   Liver Function Tests:  Recent Labs Lab 11/17/15 1435  AST 20  ALT 26  ALKPHOS 59  BILITOT 1.2  PROT 6.1*  ALBUMIN 3.6   CBC:  Recent Labs Lab 11/17/15 1435 11/18/15 0655  WBC 7.9 15.3*  NEUTROABS 5.4  --   HGB 12.8* 14.2  HCT 37.8* 41.7  MCV 97.9 97.7  PLT 231 271   Cardiac Enzymes:  Recent  Labs Lab 11/17/15 1435 11/17/15 1712 11/17/15 2110 11/18/15 0242 11/18/15 1117  TROPONINI 0.05* 0.04* 0.04* 0.03* 0.03*   Radiological Exams on Admission: Dg Chest 2 View  Result Date: 11/20/2015 CLINICAL DATA:  Pneumonia followup EXAM: CHEST  2 VIEW COMPARISON:  11/17/2015. FINDINGS: Mild cardiac enlargement. Aortic atherosclerosis is noted. There is a a opacity in the posterior left base compatible with pneumonia. IMPRESSION: Left base pneumonia. Aortic atherosclerosis. Electronically Signed   By: Kerby Moors M.D.   On: 11/20/2015 15:34    EKG: Independently reviewed.    Assessment/Plan Present on Admission: . Orthostatic hypotension . Atrial fibrillation ([place]) . Tachycardia-bradycardia syndrome ([place]) . Chronic diastolic CHF (congestive heart failure) ([place]) . Essential hypertension . HCAP (healthcare-associated pneumonia) . Hypotension  PLAN:  Orthostasis:  Continue with current BP regimen.  He is doing better.  Will give small amount of IVF, noting hx recent admission for CHF.  He will need continue with PT, and is at risk for falling as he has falling with injuries in the recent past.   Fecal impaction, with hx of laxative use.  Need to exclude colon CA.  Appreciate Dr Ewell Poe help.  Bowel regimen recommended was started.  See GI's note.  Plan to d/c Xarelto and proceed with colonoscopy Wednesday.   Afib:  Hold anticoagulation in anticipation for colonoscopy.  Rate is controlled with CCB.  HCAP:  CXR confirmed PNA.  He was started on Zosyn.  Today is Day 2.   Confusion:  Suspect he has dementia.  Work up included normal B12, negative RPR, negative HIV.  Defer Tx to PCP.  Other plans as per orders. Code Status: FULL Haskel Khan, MD.  FACP [facility] Pager [phone] 7pm to 7am.  11/21/2015, 1:30 PM

## 2015-11-22 ENCOUNTER — Encounter (HOSPITAL_COMMUNITY)
Admission: EM | Disposition: A | Discharge status: Skilled Nursing Facility | Payer: Self-pay | Source: Home / Self Care | Attending: Internal Medicine

## 2015-11-22 DIAGNOSIS — J189 Pneumonia, unspecified organism: Secondary | ICD-10-CM

## 2015-11-22 DIAGNOSIS — I482 Chronic atrial fibrillation: Secondary | ICD-10-CM

## 2015-11-22 DIAGNOSIS — I951 Orthostatic hypotension: Principal | ICD-10-CM

## 2015-11-22 DIAGNOSIS — I5032 Chronic diastolic (congestive) heart failure: Secondary | ICD-10-CM

## 2015-11-22 SURGERY — COLONOSCOPY
Anesthesia: Moderate Sedation

## 2015-11-22 MED ORDER — CARVEDILOL 3.125 MG PO TABS
6.2500 mg | ORAL_TABLET | Freq: Two times a day (BID) | ORAL | Status: DC
  Administered 2015-11-22 – 2015-11-23 (×2): 6.25 mg via ORAL
  Filled 2015-11-22 (×2): qty 2

## 2015-11-22 MED ORDER — RIVAROXABAN 20 MG PO TABS
20.0000 mg | ORAL_TABLET | Freq: Every day | ORAL | Status: DC
  Administered 2015-11-22 – 2015-11-26 (×5): 20 mg via ORAL
  Filled 2015-11-22 (×5): qty 1

## 2015-11-22 MED ORDER — LORAZEPAM 2 MG/ML IJ SOLN
0.2500 mg | Freq: Four times a day (QID) | INTRAMUSCULAR | Status: DC | PRN
  Administered 2015-11-22 – 2015-11-24 (×3): 0.25 mg via INTRAVENOUS
  Filled 2015-11-22 (×3): qty 1

## 2015-11-22 MED ORDER — GUAIFENESIN ER 600 MG PO TB12
1200.0000 mg | ORAL_TABLET | Freq: Two times a day (BID) | ORAL | Status: DC
  Administered 2015-11-22 – 2015-11-27 (×10): 1200 mg via ORAL
  Filled 2015-11-22 (×11): qty 2

## 2015-11-22 MED ORDER — DILTIAZEM HCL 25 MG/5ML IV SOLN
10.0000 mg | Freq: Once | INTRAVENOUS | Status: AC
  Administered 2015-11-22: 10 mg via INTRAVENOUS
  Filled 2015-11-22: qty 5

## 2015-11-22 MED ORDER — CARVEDILOL 3.125 MG PO TABS
6.2500 mg | ORAL_TABLET | Freq: Once | ORAL | Status: AC
  Administered 2015-11-22: 6.25 mg via ORAL
  Filled 2015-11-22: qty 2

## 2015-11-22 NOTE — Progress Notes (Signed)
PT Cancellation Note  Patient Details Name: Zachary Arellano MRN: [id number] DOB: Dec 07, 1931   Cancelled Treatment:    Reason Eval/Treat Not Completed: Patient not medically ready (Chart reviewed; last vitals assessed with HR at 126bpm and BP: 162/193mm hg. These are outside of the safe guidelines for exersion and/or working with PT [place]. Will continue to follow remotely and work with patient once medically stable. )  2:53 PM, 11/22/15 Etta Grandchild, PT, DPT Physical Therapist at [facility] [phone] (office)

## 2015-11-22 NOTE — Evaluation (Signed)
Speech Language Pathology Evaluation Patient Details Name: Zachary Arellano MRN: [id number] DOB: Dec 16, 1931 Today's Date: 11/22/2015 Time: [id number] SLP Time Calculation (min) (ACUTE ONLY): 22 min  Problem List:  Patient Active Problem List   Diagnosis Date Noted  . HCAP (healthcare-associated pneumonia) 11/20/2015  . Hypotension 11/20/2015  . Orthostatic hypotension 11/17/2015  . Chronic diastolic CHF (congestive heart failure) ([place]) 11/17/2015  . Tachycardia-bradycardia syndrome ([place])   . Acute diastolic heart failure ([place])   . General weakness   . Rib contusion   . Hyponatremia 10/30/2015  . Hypokalemia 10/30/2015  . Symptomatic bradycardia   . Current use of long term anticoagulation 10/14/2013  . Atrial fibrillation ([place]) 09/08/2012  . Essential hypertension 09/08/2012   Past Medical History:  Past Medical History:  Diagnosis Date  . Chronic atrial fibrillation (HCC)    2D echo  09/23/2006 LA and right atrium severely dilated, moderate pulmonary hypertension  . Glaucoma   . Hypertension   . Rib fracture   . Stroke (cerebrum) (HCC)    Left cerebellum.   Past Surgical History:  Past Surgical History:  Procedure Laterality Date  . CATARACT EXTRACTION    . skin cancer removal     HPI:  [name]  a 3 yom with a hx of chronic afib, HTN, prior CVA, glaucoma, and frequent falls presented with complaints of dizziness and mechanical fall. While begin evaluated he was noted to have hypotension. He was admitted for further evaluation of orthostatic hypotension. Gastroenterology and physical therapy have been consulted. Chest x ray revealed left base PNA.    Assessment / Plan / Recommendation Clinical Impression  Pt presents with moderate to severe cognitive deficits of unknown etiology as evidenced by MOCA: 10/30. Note educational hx including college work. PLOF pt lived with spouse and completed some complex ADLs. Pt with temporal disorientation, reduced thought organization, poor  recall of novel information, and reduced insight to deficits. Unclear of baseline cognitive status. No family member present to determine baseline functioning and SLP unable to reach family over the phone. Speech and language abilities appear intact. Recommend continued ST services to address acute cognitive decline. Will follow up for family education.           SLP Assessment  Patient needs continued Speech Lanaguage Pathology Services    Follow Up Recommendations  24 hour supervision/assistance    Frequency and Duration min 2x/week  1 week      SLP Evaluation Prior Functioning  Type of Home: House  Lives With: Spouse Available Help at Discharge: Family Education: college Vocation: Retired   Associate Professor  Overall Cognitive Status: Impaired/Different from baseline Arousal/Alertness: Awake/alert Orientation Level: Disoriented to situation;Disoriented to time;Disoriented to place Memory: Impaired Memory Impairment: Decreased recall of new information;Decreased short term memory Decreased Short Term Memory: Verbal basic;Functional basic Awareness: Impaired Problem Solving: Impaired Executive Function: Organizing;Reasoning;Sequencing Reasoning: Impaired Sequencing: Impaired Organizing: Impaired Safety/Judgment: Impaired    Comprehension  Auditory Comprehension Overall Auditory Comprehension: Appears within functional limits for tasks assessed Visual Recognition/Discrimination Discrimination: Within Function Limits Reading Comprehension Reading Status: Within funtional limits    Expression Expression Primary Mode of Expression: Verbal Verbal Expression Overall Verbal Expression: Appears within functional limits for tasks assessed Written Expression Dominant Hand: Right   Oral / Motor  Oral Motor/Sensory Function Overall Oral Motor/Sensory Function: Within functional limits Motor Speech Overall Motor Speech: Appears within functional limits for tasks assessed   GO  Arvil Chaco MA, [place] [facility] Speech Language Pathologist    Levi Aland 11/22/2015, 5:20 PM

## 2015-11-22 NOTE — Progress Notes (Signed)
Contacted Dr Laural Golden about the patient not being able to finish Golytely.  Orders to stop the Golytely and restart at 6AM.  Told the patient and he was agreeable.

## 2015-11-22 NOTE — Progress Notes (Signed)
Patient confused, screaming that he wants to go home, pulling off clothing, trying to get OOB.  Patients family notified and security called to help assist with patient.  On call MD notified and new orders for 0.5mg  of ativan and waist restraints ordered.  Medication given and patients family helped to calm the patient.   Currently not requiring restraint.  Patient is resting in room with family.  Will continue to monitor the patient.

## 2015-11-22 NOTE — Progress Notes (Signed)
Patient and family decided to hold off on colonoscopy. Therefore procedure was canceled this morning. Patient is undergoing evaluation by speech pathologist to assess oropharyngeal function. Will resume Xarlto at prior dose. Will consider colonoscopy on an outpatient basis when all of acute issues have resolved.

## 2015-11-22 NOTE — Progress Notes (Signed)
Received verbal order from MD to give a one time dose of  Coreg 6.25 mg for Heart rate >140. Pt in stable condition and denies chest pain or discomfort. Will continue to monitor patient throughout the shift.

## 2015-11-22 NOTE — Progress Notes (Signed)
Patient and family decided that they do not want to proceed with the colonoscopy tomorrow.  Will notify Dr. Laural Golden in the AM.

## 2015-11-22 NOTE — Evaluation (Signed)
Clinical/Bedside Swallow Evaluation Patient Details  Name: Zachary Arellano MRN: [id number] Date of Birth: Mar 23, 1932  Today's Date: 11/22/2015 Time: SLP Start Time (ACUTE ONLY): [id number] SLP Stop Time (ACUTE ONLY): 1649 SLP Time Calculation (min) (ACUTE ONLY): 25 min  Past Medical History:  Past Medical History:  Diagnosis Date  . Chronic atrial fibrillation (HCC)    2D echo  09/23/2006 LA and right atrium severely dilated, moderate pulmonary hypertension  . Glaucoma   . Hypertension   . Rib fracture   . Stroke (cerebrum) (HCC)    Left cerebellum.   Past Surgical History:  Past Surgical History:  Procedure Laterality Date  . CATARACT EXTRACTION    . skin cancer removal     HPI:  Zachary Arellano ia  a 20 yom with a hx of chronic afib, HTN, prior CVA, glaucoma, and frequent falls presented with complaints of dizziness and mechanical fall. While begin evaluated he was noted to have hypotension. He was admitted for further evaluation of orthostatic hypotension. Gastroenterology and physical therapy have been consulted. Chest x ray revealed left base PNA.    Assessment / Plan / Recommendation Clinical Impression  Zachary Arellano presents with suspected moderate pharyngeal dysphagia. Zachary Arellano with symptomatic BSE concerning for reduced airway protection. Note chest x ray with left base PNA. Zachary Arellano displayed immediate and delayed coughing following thin liquids via cup and straw sip. Zachary Arellano with multiple swallows per bolus with suspected pharyngeal residuals given delayed wet vocal quality. Cueing for throat clear and additional swallow improved vocal quality. Suspect improved airway protection with nectar thick liquids as no overt signs or symptoms of aspiration were noted however intermittent delayed wet vocal quality persisted. Zachary Arellano with functional mastication of solid PO.  Given symptomatic BSE, acute PNA, and cognitive deficits recommend diet downgrade to nectar thick liquids with regular consistencies and medicines whole in puree.  Recommend [employer] Protocol (water in between meals following oral care) with no straw usage. ST to follow up for diet tolerance.         Aspiration Risk  Moderate aspiration risk    Diet Recommendation     Medication Administration: Whole meds with puree    Other  Recommendations Oral Care Recommendations: Oral care BID Other Recommendations: Order thickener from pharmacy   Follow up Recommendations  24 hour supervision/assistance    Frequency and Duration min 2x/week  1 week       Prognosis Barriers to Reach Goals: Cognitive deficits      Swallow Study   General Date of Onset: 11/17/15 HPI: Zachary Arellano ia  a 28 yom with a hx of chronic afib, HTN, prior CVA, glaucoma, and frequent falls presented with complaints of dizziness and mechanical fall. While begin evaluated he was noted to have hypotension. He was admitted for further evaluation of orthostatic hypotension. Gastroenterology and physical therapy have been consulted. Chest x ray revealed left base PNA.  Type of Study: Bedside Swallow Evaluation Previous Swallow Assessment: none on file  Diet Prior to this Study: Thin liquids;Regular Temperature Spikes Noted: No Respiratory Status: Room air History of Recent Intubation: No Behavior/Cognition: Alert;Confused Oral Cavity Assessment: Within Functional Limits Oral Cavity - Dentition: Adequate natural dentition Vision: Functional for self-feeding Self-Feeding Abilities: Able to feed self Patient Positioning: Upright in bed Baseline Vocal Quality: Low vocal intensity Volitional Cough: Strong Volitional Swallow: Able to elicit    Oral/Motor/Sensory Function Overall Oral Motor/Sensory Function: Within functional limits   Ice Chips Ice chips: Not tested   Thin Liquid Thin Liquid: Impaired Presentation: Straw;Cup  Oral Phase Functional Implications: Prolonged oral transit Pharyngeal  Phase Impairments: Suspected delayed Swallow;Multiple swallows;Wet Vocal Quality;Cough -  Immediate;Cough - Delayed;Throat Clearing - Delayed    Nectar Thick Nectar Thick Liquid: Impaired Presentation: Cup Oral phase functional implications: Prolonged oral transit Pharyngeal Phase Impairments: Suspected delayed Swallow;Multiple swallows;Wet Vocal Quality;Throat Clearing - Delayed   Honey Thick Honey Thick Liquid: Not tested   Puree Puree: Within functional limits   Solid   GO   Solid: Within functional limits       Arvil Chaco MA, CCC-SLP Acute Care Speech Language Pathologist    Arvil Chaco E 11/22/2015,4:57 PM

## 2015-11-22 NOTE — Clinical Social Work Note (Signed)
Clinical Social Work Assessment  Patient Details  Name: Zachary Arellano MRN: [id number] Date of Birth: 02/28/1932  Date of referral:  11/21/15               Reason for consult:  Facility Placement                Permission sought to share information with:    Permission granted to share information::     Name::        Agency::     Relationship::     Contact Information:  Sons, spouse and daughter in law were at bedside.   Housing/Transportation Living arrangements for the past 2 months:  [place] of Information:  Adult Children Patient Interpreter Needed:  None Criminal Activity/Legal Involvement Pertinent to Current Situation/Hospitalization:  No - Comment as needed Significant Relationships:  Adult Children, Spouse, Other Family Members Lives with:  Spouse Do you feel safe going back to the place where you live?  Yes Need for family participation in patient care:  Yes (Comment)  Care giving concerns: Patient's wife is his primary caregiver and that patient's care has become too difficult to manage.    Social Worker assessment / plan:  Patient's sons advised that despite the PT recommendation of HHPT they desired for patient to go to SNF under his Medicare. CSW discussed that this was not feasible due to patient's PT recommedation. Sons advised that they had requested an additional PT evaluation for a second opinion.  Family advised that they desired for patient to go to [facility] or [place]. CSW discussed that at this point [facility] would be private pay due to the PT recommendation. Family was agreeable to private pay. CSW provided both the SNF and ALF listing.   Employment status:  Retired Forensic scientist:  Medicare PT Recommendations:  Home with [place] / Referral to community resources:  [place]  Patient/Family's Response to care:  Family desires for patient to go to a facility.   Patient/Family's Understanding of and Emotional  Response to Diagnosis, Current Treatment, and Prognosis:  Family accepts that patient requires more care that his wife an provide in the home at this time.   Emotional Assessment Appearance:  Appears stated age Attitude/Demeanor/Rapport:   (Cooperative/Pleasant) Affect (typically observed):  Accepting Orientation:  Oriented to Self Alcohol / Substance use:  Not Applicable Psych involvement (Current and /or in the community):  No (Comment)  Discharge Needs  Concerns to be addressed:  Discharge Planning Concerns Readmission within the last 30 days:  Yes Current discharge risk:  None Barriers to Discharge:  No Barriers Identified   Ihor Gully, LCSW 11/22/2015, 12:07 PM

## 2015-11-22 NOTE — Progress Notes (Signed)
PROGRESS NOTE    Zachary Arellano  [id number] DOB: 26-Nov-1931 DOA: 11/17/2015 PCP: [name], MD    Brief Narrative:  31 yom with a hx of chronic afib, HTN, prior CVA, glaucoma, and frequent falls presented with complaints of  dizziness and mechanical fall. While begin evaluated he was noted to have hypotension. He was admitted for further evaluation of orthostatic hypotension. Gastroenterology and physical therapy have been consulted.   Assessment & Plan:   Principal Problem:   Orthostatic hypotension Active Problems:   Atrial fibrillation (HCC)   Essential hypertension   Current use of long term anticoagulation   General weakness   Tachycardia-bradycardia syndrome (HCC)   Chronic diastolic CHF (congestive heart failure) (HCC)   HCAP (healthcare-associated pneumonia)   Hypotension   1. Orthostatic hypotension felt to be related to mildly hydration as well as medications. Medications have been adjusted and BP has improved. Orthostatic symptoms have resolved.  2. Fecal impaction with a hx of laxative use. Continue bowel regimen. GI consulted for colonoscopy to r/o any colon mass. Patient and family are now agreeable to proceed with colonoscopy.   3. Afib. Hold anticoagulation for colonoscopy. Rate is uncontrolled. Will restart on telemetry. Change atenolol to coreg.  4. Pnuemonia. CXR revealed left lower pneumonia. Continue Zosyn.  5. Dysphagia. Staff has concerns that pt may be aspirating after eating/drinking. Will request speech consult.   6. Chronic diastolic CHF. Appears compensated at this time. Continue current treatments.  7. Confusion. Suspect the pt has some degree of dementia. He is exhibiting signs of sundowning. Will place on prn ativan.    DVT prophylaxis: SCDs Code Status: Full Family Communication: Family bedside Disposition Plan: Discharge home once improved     Consultants:   Gastroenterology  Physical therapy   Procedures:   None   Antimicrobials:     Zosyn 8/7 >>   Subjective: Feels better.  Denies coughing after eating. States he had liquid stool. Seems somewhat confused.   Objective: Vitals:   11/21/15 0100 11/21/15 0714 11/21/15 1401 11/21/15 1940  BP:  (!) 143/92 (!) 154/93   Pulse:  (!) 57 (!) 46   Resp:  20 20 (!) 24  Temp:  97.7 F (36.5 C) 98 F (36.7 C) 97.7 F (36.5 C)  TempSrc:  Oral Oral   SpO2:  99% 99% 91%  Weight: 67.4 kg (148 lb 11.2 oz)     Height: 5\' 8"  (1.727 m)       Intake/Output Summary (Last 24 hours) at 11/22/15 0748 Last data filed at 11/22/15 [id number]  Gross per 24 hour  Intake             1290 ml  Output              300 ml  Net              990 ml   Filed Weights   11/17/15 1328 11/17/15 2043 11/21/15 0100  Weight: 72.6 kg (160 lb) 67.4 kg (148 lb 11.2 oz) 67.4 kg (148 lb 11.2 oz)    Examination:  General exam: Appears calm and comfortable  Respiratory system: Crackles at his left base .Respiratory effort normal. Cardiovascular system: Heart rate is irregular S1 & S2 heard,. No JVD, murmurs, rubs, gallops or clicks. No pedal edema. Gastrointestinal system: Abdomen is nondistended, soft and nontender. No organomegaly or masses felt. Normal bowel sounds heard. Central nervous system: Alert and oriented. No focal neurological deficits. Extremities: Symmetric 5 x 5 power. Skin: No  rashes, lesions or ulcers Psychiatry: Judgement and insight appear normal. Mood & affect appropriate.     Data Reviewed: I have personally reviewed following labs and imaging studies  CBC:  Recent Labs Lab 11/17/15 1435 11/18/15 0655  WBC 7.9 15.3*  NEUTROABS 5.4  --   HGB 12.8* 14.2  HCT 37.8* 41.7  MCV 97.9 97.7  PLT 231 [id number]   Basic Metabolic Panel:  Recent Labs Lab 11/17/15 1435 11/18/15 0655  NA 134* 135  K 3.8 3.8  CL 95* 97*  CO2 30 27  GLUCOSE 93 101*  BUN 20 19  CREATININE 0.89 0.73  CALCIUM 9.4 9.3   GFR: Estimated Creatinine Clearance: 68 mL/min (by C-G formula based on SCr  of 0.8 mg/dL). Liver Function Tests:  Recent Labs Lab 11/17/15 1435  AST 20  ALT 26  ALKPHOS 59  BILITOT 1.2  PROT 6.1*  ALBUMIN 3.6   No results for input(s): LIPASE, AMYLASE in the last 168 hours. No results for input(s): AMMONIA in the last 168 hours. Coagulation Profile:  Recent Labs Lab 11/17/15 1435  INR 1.62   Cardiac Enzymes:  Recent Labs Lab 11/17/15 1435 11/17/15 1712 11/17/15 2110 11/18/15 0242 11/18/15 1117  TROPONINI 0.05* 0.04* 0.04* 0.03* 0.03*   BNP (last 3 results) No results for input(s): PROBNP in the last 8760 hours. HbA1C: No results for input(s): HGBA1C in the last 72 hours. CBG: No results for input(s): GLUCAP in the last 168 hours. Lipid Profile: No results for input(s): CHOL, HDL, LDLCALC, TRIG, CHOLHDL, LDLDIRECT in the last 72 hours. Thyroid Function Tests: No results for input(s): TSH, T4TOTAL, FREET4, T3FREE, THYROIDAB in the last 72 hours. Anemia Panel: No results for input(s): VITAMINB12, FOLATE, FERRITIN, TIBC, IRON, RETICCTPCT in the last 72 hours. Sepsis Labs: No results for input(s): PROCALCITON, LATICACIDVEN in the last 168 hours.  No results found for this or any previous visit (from the past 240 hour(s)).       Radiology Studies: Dg Chest 2 View  Result Date: 11/20/2015 CLINICAL DATA:  Pneumonia followup EXAM: CHEST  2 VIEW COMPARISON:  11/17/2015. FINDINGS: Mild cardiac enlargement. Aortic atherosclerosis is noted. There is a a opacity in the posterior left base compatible with pneumonia. IMPRESSION: Left base pneumonia. Aortic atherosclerosis. Electronically Signed   By: Kerby Moors M.D.   On: 11/20/2015 15:34        Scheduled Meds: . atenolol  25 mg Oral Daily  . lactulose  10 g Oral Daily  . nystatin-triamcinolone   Topical BID  . nystatin-triamcinolone   Topical BID  . piperacillin-tazobactam (ZOSYN)  IV  3.375 g Intravenous Q8H  . polyethylene glycol  17 g Oral Daily  . sodium chloride flush  3 mL  Intravenous Q12H   Continuous Infusions:    LOS: 2 days    Time spent: 25 minutes     Kathie Dike, MD [facility] If 7PM-7AM, please contact night-coverage [email or web address] Password [email or web address] 11/22/2015, 7:48 AM   By signing my name below, I, Collene Zachary, attest that this documentation has been prepared under the direction and in the presence of Kathie Dike, MD. Electronically signed: Collene Zachary, Scribe. 11/22/15 1:30 PM   I, Dr. Kathie Dike, personally performed the services described in this documentaiton. All medical record entries made by the scribe were at my direction and in my presence. I have reviewed the chart and agree that the record reflects my personal performance and is accurate and complete  Kathie Dike, MD, 11/22/2015 2:06 PM

## 2015-11-22 NOTE — Progress Notes (Signed)
Pt's heart rate >140 Received verbal order from MD to give Cardizem 10 mg IV.

## 2015-11-22 NOTE — Clinical Social Work Placement (Signed)
   CLINICAL SOCIAL WORK PLACEMENT  NOTE  Date:  11/22/2015  Patient Details  Name: DIMITRIUS COTTAM MRN: [id number] Date of Birth: 05-18-1931  Clinical Social Work is seeking post-discharge placement for this patient at the [place] level of care (*CSW will initial, date and re-position this form in  chart as items are completed):  Yes   Patient/family provided with [place] Work Department's list of facilities offering this level of care within the geographic area requested by the patient (or if unable, by the patient's family).  Yes   Patient/family informed of their freedom to choose among providers that offer the needed level of care, that participate in Medicare, Medicaid or managed care program needed by the patient, have an available bed and are willing to accept the patient.  Yes   Patient/family informed of [place]'s ownership interest in [facility] and [facility], as well as of the fact that they are under no obligation to receive care at these facilities.  PASRR submitted to EDS on 11/21/15     PASRR number received on 11/21/15     Existing PASRR number confirmed on       FL2 transmitted to all facilities in geographic area requested by pt/family on       FL2 transmitted to all facilities within larger geographic area on       Patient informed that his/her managed care company has contracts with or will negotiate with certain facilities, including the following:        Yes   Patient/family informed of bed offers received.  Patient chooses bed at [facility]     Physician recommends and patient chooses bed at      Patient to be transferred to [facility] on  .  Patient to be transferred to facility by       Patient family notified on   of transfer.  Name of family member notified:        PHYSICIAN       Additional Comment:    _______________________________________________ Ihor Gully,  LCSW 11/22/2015, 12:19 PM

## 2015-11-22 NOTE — Progress Notes (Signed)
Dr Laural Golden notified of patient and family's decision to cancel the colonoscopy for now.  New orders received and carries out.

## 2015-11-23 ENCOUNTER — Ambulatory Visit: Payer: 59 | Admitting: Cardiology

## 2015-11-23 ENCOUNTER — Encounter: Payer: Self-pay | Admitting: Cardiology

## 2015-11-23 DIAGNOSIS — I10 Essential (primary) hypertension: Secondary | ICD-10-CM

## 2015-11-23 LAB — BASIC METABOLIC PANEL
Anion gap: 9 (ref 5–15)
BUN: 16 mg/dL (ref 6–20)
CHLORIDE: 101 mmol/L (ref 101–111)
CO2: 31 mmol/L (ref 22–32)
Calcium: 8.6 mg/dL — ABNORMAL LOW (ref 8.9–10.3)
Creatinine, Ser: 0.79 mg/dL (ref 0.61–1.24)
GFR calc Af Amer: >60 mL/min (ref >60)
GFR calc non Af Amer: >60 mL/min (ref >60)
GLUCOSE: 96 mg/dL (ref 65–99)
POTASSIUM: 3.2 mmol/L — AB (ref 3.5–5.1)
Sodium: 141 mmol/L (ref 135–145)

## 2015-11-23 MED ORDER — CARVEDILOL 12.5 MG PO TABS
12.5000 mg | ORAL_TABLET | Freq: Two times a day (BID) | ORAL | Status: DC
Start: 2015-11-23 — End: 2015-11-27
  Administered 2015-11-23 – 2015-11-27 (×8): 12.5 mg via ORAL
  Filled 2015-11-23 (×8): qty 1

## 2015-11-23 MED ORDER — TIMOLOL MALEATE 0.5 % OP SOLN
1.0000 [drp] | Freq: Two times a day (BID) | OPHTHALMIC | Status: DC
  Administered 2015-11-24 – 2015-11-27 (×7): 1 [drp] via OPHTHALMIC

## 2015-11-23 MED ORDER — LATANOPROST 0.005 % OP SOLN
OPHTHALMIC | Status: AC
  Filled 2015-11-23: qty 2.5

## 2015-11-23 MED ORDER — BRIMONIDINE TARTRATE 0.2 % OP SOLN
1.0000 [drp] | Freq: Two times a day (BID) | OPHTHALMIC | Status: DC
  Administered 2015-11-24 – 2015-11-27 (×7): 1 [drp] via OPHTHALMIC
  Filled 2015-11-23: qty 5

## 2015-11-23 MED ORDER — POLYETHYL GLYCOL-PROPYL GLYCOL 0.4-0.3 % OP SOLN: 2.0000 [drp] | Freq: Two times a day (BID) | OPHTHALMIC | Status: DC

## 2015-11-23 MED ORDER — BRIMONIDINE TARTRATE-TIMOLOL 0.2-0.5 % OP SOLN: 1.0000 [drp] | Freq: Two times a day (BID) | OPHTHALMIC | Status: DC

## 2015-11-23 MED ORDER — DILTIAZEM HCL 60 MG PO TABS
60.0000 mg | ORAL_TABLET | Freq: Two times a day (BID) | ORAL | Status: DC
  Administered 2015-11-23 – 2015-11-24 (×2): 60 mg via ORAL
  Filled 2015-11-23 (×2): qty 1

## 2015-11-23 MED ORDER — POLYVINYL ALCOHOL 1.4 % OP SOLN: 2.0000 [drp] | Freq: Two times a day (BID) | OPHTHALMIC | Status: DC | PRN

## 2015-11-23 MED ORDER — DILTIAZEM HCL 25 MG/5ML IV SOLN
10.0000 mg | Freq: Once | INTRAVENOUS | Status: AC
  Administered 2015-11-23: 10 mg via INTRAVENOUS
  Filled 2015-11-23: qty 5

## 2015-11-23 MED ORDER — PILOCARPINE HCL 2 % OP SOLN
1.0000 [drp] | Freq: Four times a day (QID) | OPHTHALMIC | Status: DC
  Administered 2015-11-24 – 2015-11-27 (×11): 1 [drp] via OPHTHALMIC
  Filled 2015-11-23: qty 15

## 2015-11-23 MED ORDER — POTASSIUM CHLORIDE CRYS ER 20 MEQ PO TBCR
40.0000 meq | EXTENDED_RELEASE_TABLET | Freq: Once | ORAL | Status: AC
  Administered 2015-11-23: 40 meq via ORAL
  Filled 2015-11-23: qty 2

## 2015-11-23 MED ORDER — CARVEDILOL 3.125 MG PO TABS
6.2500 mg | ORAL_TABLET | Freq: Once | ORAL | Status: AC
  Administered 2015-11-23: 6.25 mg via ORAL
  Filled 2015-11-23: qty 2

## 2015-11-23 MED ORDER — LATANOPROST 0.005 % OP SOLN
1.0000 [drp] | Freq: Every day | OPHTHALMIC | Status: DC
  Administered 2015-11-23 – 2015-11-27 (×5): 1 [drp] via OPHTHALMIC
  Filled 2015-11-23: qty 2.5

## 2015-11-23 NOTE — Progress Notes (Signed)
Pharmacy Antibiotic Note  Zachary Arellano is a 82 y.o. male admitted on 11/17/2015 after fall.  Pharmacy has been consulted for zosyn dosing.  Plan: Continue Zosyn 3.375g IV q8h (4 hour infusion).  Height: 5\' 8"  (172.7 cm) Weight: 148 lb 11.2 oz (67.4 kg) IBW/kg (Calculated) : 68.4  Temp (24hrs), Avg:98.7 F (37.1 C), Min:98.7 F (37.1 C), Max:98.7 F (37.1 C)   Recent Labs Lab 11/17/15 1435 11/18/15 0655 11/23/15 0558  WBC 7.9 15.3*  --   CREATININE 0.89 0.73 0.79    Estimated Creatinine Clearance: 68 mL/min (by C-G formula based on SCr of 0.8 mg/dL).    No Known Allergies  Antimicrobials this admission: zosyn 8/7 >>   Thank you for allowing pharmacy to be a part of this patient's care.  Pricilla Larsson 11/23/2015 1:03 PM

## 2015-11-23 NOTE — Progress Notes (Signed)
Physical Therapy Re-Assessment Patient Details Name: Zachary Arellano MRN: [id number] DOB: [birth year] Today's Date: 11/23/2015    History of Present Illness 82 yo M admitted 11/17/2015 s/p fall after getting dizzy upon standing and weak.  Pt also c/o of tenesmus and abdominal pelvic CT which showed obstipation, along with bladder wall thickening, and a right exophylic renal mass needing follow up.  Pt also with some confusion - dementia?, Afib with RVR.   PMH: recent admission for fall 10/30/2015  and also found to have acute CHF that admission.  He was d/c home with HHPT because he was ambulating 5101ft with RW and Mod I and negotiated 10 steps.  chronic Afib, moderate pulmonary HTN, HTN, rib fx, L cerebellum CVA, rib fx, last admission acute CHF.     PT Comments    Pt received in bed, sitter present, no family present at this time.  Pt is agreeable to PT reassessment.  Pt continues to demonstrate difficulty with sequencing of tasks, and requires vc's for technique with nearly all functional mobility.  He also is requiring increased assistance for supine<>sit, and sit<>stand, and gait.  He ambulated 81ft with RW and Min A with crouched gait requiring assistance for RW navigation.  (This is compared to 287ft with RW and min guard 3 days ago.)  During gait training he was incontinent of his bowels, and therefore assisted into the bathroom where he required total A for peri-hygiene.  He was then being assisted with gait back towards the bed when he was incontinent of urine.  Pt demonstrates poor ability to turn and sit down - not getting completely lined up with toilet or bed when going to sit down on either, and attempting to turn RW away before getting lined up to sit.  With this significant decline in functional mobility, he is appropriate for SNF with PT to continue to progress strength, balance, and endurance.     Follow Up Recommendations  SNF     Equipment Recommendations  None recommended by PT    Recommendations for Other Services       Precautions / Restrictions Precautions Precautions: Fall Precaution Comments: Reason for admission, and previous admission for same.  Wife reports that he passes out and falls out of the bed.  Restrictions Weight Bearing Restrictions: No    Mobility  Bed Mobility Overal bed mobility: Needs Assistance Bed Mobility: Supine to Sit;Sit to Supine     Supine to sit: Min assist;HOB elevated (vc's for hand placement, and increased time) Sit to supine: Min assist (To lift LE's into the bed. )      Transfers Overall transfer level: Needs assistance Equipment used: Rolling walker (2 wheeled) Transfers: Sit to/from [facility] Sit to Stand: Min assist (vc's for hand placement, increased time required to power up.  Pt not able to obtain full upright posture, and instead remains crouched.) Stand pivot transfers: Mod assist;+2 physical assistance;+2 safety/equipment (Pt tries to park the RW to the side when going to sit on the toilet.  Upon sitting down, he requires assistance to guide buttocks on to the commode due to poor ability to get lined up.)       General transfer comment: Pt required two person assist from bed to [facility] for safety and sequencing. Pt unable to move feet during turn even with consistent verbal cuing  Ambulation/Gait Ambulation/Gait assistance: Min assist Ambulation Distance (Feet): 10 Feet Assistive device: Rolling walker (2 wheeled) Gait Pattern/deviations: Step-to pattern;Festinating;Decreased step length - right;Decreased step  length - left;Trunk flexed   Gait velocity interpretation: <1.8 ft/sec, indicative of risk for recurrent falls General Gait Details: Pt demonstrates drastically different gait pattern than that observed 3 days ago.  He is very crouched, and festinating, requires Min A for RW navigation, and vc's to keep feet inside the base of the RW.   Noted safety impairments today with having BM while  ambulating, therefore assisted pt into the bathroom, total A for peri-hygiene, and he then ambulated to the sink where he required 1 step commands to find items such as soap and paper towels.     Stairs            Wheelchair Mobility    Modified Rankin (Stroke Patients Only)       Balance   Sitting-balance support: Bilateral upper extremity supported;Feet supported (Supervision) Sitting balance-Leahy Scale: Fair     Standing balance support: Bilateral upper extremity supported Standing balance-Leahy Scale: Fair                      Cognition Arousal/Alertness: Awake/alert Behavior During Therapy: Flat affect Overall Cognitive Status: Impaired/Different from baseline Area of Impairment: Following commands;Safety/judgement;Awareness;Problem solving     Memory: Decreased short-term memory Following Commands: Follows one step commands inconsistently;Follows one step commands with increased time Safety/Judgement: Decreased awareness of safety;Decreased awareness of deficits   Problem Solving: Slow processing;Decreased initiation;Difficulty sequencing;Requires verbal cues;Requires tactile cues General Comments: Pt required one-step verbal cuing for sequencing during transfers and functional mobility task. Pt unable to initiate turn from bed to [facility], required verbal and 2 person assist to complete turn.     Exercises      General Comments        Pertinent Vitals/Pain Pain Assessment: No/denies pain    Home Living Family/patient expects to be discharged to:: Skilled nursing facility                    Prior Function Level of Independence: Independent with assistive device(s)  Gait / Transfers Assistance Needed: Pt and family confirm that pt was independent with ambulation.  ADL's / Homemaking Assistance Needed: independent with dressing and bathing.  Comments: Pt required assistance for all tasks between hospital admissions from 2 weeks ago to this  current admission. Pt stayed in bed during the week in between   PT Goals (current goals can now be found in the care plan section) Acute Rehab PT Goals Patient Stated Goal: Improve strength PT Goal Formulation: With patient/family Time For Goal Achievement: 12/04/15 Potential to Achieve Goals: Good Progress towards PT goals: Not progressing toward goals - comment    Frequency  Min 5X/week    PT Plan Frequency needs to be updated    Co-evaluation             End of Session Equipment Utilized During Treatment: Gait belt Activity Tolerance: Patient limited by fatigue Patient left: in bed;with bed alarm set;with nursing/sitter in room;with call bell/phone within reach     Time: 1248-1315 PT Time Calculation (min) (ACUTE ONLY): 27 min  Charges:  $Gait Training: 8-22 mins $Therapeutic Activity: 8-22 mins                    G Codes:      Beth Elis Sauber, PT, DPT X: [phone]

## 2015-11-23 NOTE — Progress Notes (Signed)
Received verbal order from MD to give Cardizem 10 mg via IV.

## 2015-11-23 NOTE — Progress Notes (Signed)
Pt's Heart Rate > 130. MD paged. Pt asymptomatic and denies chest pain or discomfort will continue to monitor patient throughout the shift.

## 2015-11-23 NOTE — Progress Notes (Signed)
Cardizem 10 mg via IV given to patient. Will continue to monitor patient throughout the shift.

## 2015-11-23 NOTE — Progress Notes (Signed)
Speech Language Pathology Treatment: Dysphagia;Cognitive-Linquistic  Patient Details Name: Zachary Arellano MRN: [id number] DOB: 04/19/1931 Today's Date: 11/23/2015 Time: [id number] SLP Time Calculation (min) (ACUTE ONLY): 28 min  Assessment / Plan / Recommendation Clinical Impression  SLP followed up for family education regarding results of cognitive testing and swallow education. Pts son reports that his father was previously independent with all ADLS though he had noticed a decline in thinking abilities.  Son states that pt has had medicine mismanagement within the past month and episodes of confusion during previously familiar tasks. Recommend continued cognitive rehabilitation at SNF.   Pt observed with thin liquid trials following oral care. Signs and symtpoms of suspected reduced airway protection persisted with isolated thin liquid trials including immediate and delayed cough, wet vocal quality, and throat clearing. No overt signs or symptoms of aspiration with nectar thick liquids, though intermittent delayed throat clear was observed. Recommend continue regular consistencies and nectar thick liquids with medicines crushed in puree. Nursing reports pt chewing meds whole in puree. ST to continue to monitor.      HPI HPI: [name]  a 60 yom with a hx of chronic afib, HTN, prior CVA, glaucoma, and frequent falls presented with complaints of dizziness and mechanical fall. While begin evaluated he was noted to have hypotension. He was admitted for further evaluation of orthostatic hypotension. Gastroenterology and physical therapy have been consulted. Chest x ray revealed left base PNA.       SLP Plan  Continue with current plan of care     Recommendations  Diet recommendations: Nectar-thick liquid;Regular Liquids provided via: Cup;No straw Medication Administration: crushed meds with puree Supervision: Full supervision/cueing for compensatory strategies;Patient able to self feed Compensations:  Small sips/bites;Multiple dry swallows after each bite/sip Postural Changes and/or Swallow Maneuvers: Seated upright 90 degrees;Upright 30-60 min after meal             Oral Care Recommendations: Oral care BID Follow up Recommendations: Skilled Nursing facility Plan: Continue with current plan of care     [place] MA, [place] Language Pathologist    Levi Aland 11/23/2015, 3:33 PM

## 2015-11-23 NOTE — NC FL2 (Signed)
[place] MEDICAID FL2 LEVEL OF CARE SCREENING TOOL     IDENTIFICATION  Patient Name: Zachary Arellano Birthdate: 08-10-1931 Sex: male Admission Date (Current Location): 11/17/2015  [facility] and [place] Number:  [employer] and Address:  [facility] [address] [place]      Provider Number: [id number]  Attending Physician Name and Address:  Kathie Dike, MD  Relative Name and Phone Number:       Current Level of Care: Hospital Recommended Level of Care: [place] Prior Approval Number:    Date Approved/Denied:   PASRR Number: [id number] ([id number])  Discharge Plan: SNF    Current Diagnoses: Patient Active Problem List   Diagnosis Date Noted  . HCAP (healthcare-associated pneumonia) 11/20/2015  . Hypotension 11/20/2015  . Orthostatic hypotension 11/17/2015  . Chronic diastolic CHF (congestive heart failure) ([place]) 11/17/2015  . Tachycardia-bradycardia syndrome ([place])   . Acute diastolic heart failure ([place])   . General weakness   . Rib contusion   . Hyponatremia 10/30/2015  . Hypokalemia 10/30/2015  . Symptomatic bradycardia   . Current use of long term anticoagulation 10/14/2013  . Atrial fibrillation ([place]) 09/08/2012  . Essential hypertension 09/08/2012    Orientation RESPIRATION BLADDER Height & Weight     Self  Normal Incontinent Weight: 148 lb 11.2 oz (67.4 kg) Height:  5\' 8"  (172.7 cm)  BEHAVIORAL SYMPTOMS/MOOD NEUROLOGICAL BOWEL NUTRITION STATUS      Incontinent Diet (Fluid Consistency: Nectar Thick: Medicines whole in puree, no straws, [employer] Protocol (water in between meals after oral care))  AMBULATORY STATUS COMMUNICATION OF NEEDS Skin   Supervision Verbally Normal                       Personal Care Assistance Level of Assistance  Bathing, Feeding, Dressing Bathing Assistance: Limited assistance Feeding assistance: Limited assistance Dressing Assistance: Limited assistance      Functional Limitations Info  Sight, Hearing, Speech Sight Info: Adequate Hearing Info: Adequate Speech Info: Adequate    SPECIAL CARE FACTORS FREQUENCY  PT (By licensed PT)     PT Frequency: 5x/week              Contractures Contractures Info: Not present    Additional Factors Info  Code Status Code Status Info: Full Code             Current Medications (11/23/2015):  This is the current hospital active medication list Current Facility-Administered Medications  Medication Dose Route Frequency Provider Last Rate Last Dose  . carvedilol (COREG) tablet 12.5 mg  12.5 mg Oral BID WC Kathie Dike, MD      . guaiFENesin (MUCINEX) 12 hr tablet 1,200 mg  1,200 mg Oral BID Kathie Dike, MD   1,200 mg at 11/23/15 0920  . guaiFENesin (ROBITUSSIN) 100 MG/5ML solution 100 mg  5 mL Oral Q4H PRN Phillips Grout, MD   100 mg at 11/18/15 0505  . lactulose (CHRONULAC) 10 GM/15ML solution 10 g  10 g Oral Daily Orvan Falconer, MD   10 g at 11/23/15 0920  . LORazepam (ATIVAN) injection 0.25 mg  0.25 mg Intravenous Q6H PRN Kathie Dike, MD   0.25 mg at 11/22/15 1951  . nystatin-triamcinolone (MYCOLOG II) cream   Topical BID Orvan Falconer, MD   1 application at [id number] [phone]  . piperacillin-tazobactam (ZOSYN) IVPB 3.375 g  3.375 g Intravenous Q8H Orvan Falconer, MD   3.375 g at 11/23/15 0518  . polyethylene glycol (  MIRALAX / GLYCOLAX) packet 17 g  17 g Oral Daily Orvan Falconer, MD   17 g at 11/23/15 0919  . rivaroxaban (XARELTO) tablet 20 mg  20 mg Oral Q supper Rogene Houston, MD   20 mg at 11/22/15 1657  . sodium chloride flush (NS) 0.9 % injection 3 mL  3 mL Intravenous Q12H Phillips Grout, MD   3 mL at 11/22/15 2100     Discharge Medications: Please see discharge summary for a list of discharge medications.  Relevant Imaging Results:  Relevant Lab Results:   Additional Information PT RECOMMENDS HHPT. FAMILY REQUESTED ANOTHER PT EVAL WHICH WILL OCCUR TODAY.  FAMILY WILL PRIVATE PAY IF THAT PT EVAL  DOES NOT RECOMMEND SNF. THEY ALSO MAY CONSIDER ALF  Kloe Oates D, LCSW

## 2015-11-23 NOTE — Progress Notes (Signed)
PROGRESS NOTE    Zachary Arellano  [id number] DOB: 1931/09/08 DOA: 11/17/2015 PCP: [name], MD    Brief Narrative:  47 yom with a hx of chronic afib, HTN, prior CVA, glaucoma, and frequent falls presented with complaints of  dizziness and mechanical fall. While begin evaluated he was noted to have hypotension. He was admitted for further evaluation of orthostatic hypotension. Gastroenterology and physical therapy have been consulted.    Assessment & Plan:   Principal Problem:   Orthostatic hypotension Active Problems:   Atrial fibrillation (HCC)   Essential hypertension   Current use of long term anticoagulation   General weakness   Tachycardia-bradycardia syndrome (HCC)   Chronic diastolic CHF (congestive heart failure) (HCC)   HCAP (healthcare-associated pneumonia)   Hypotension  1. Orthostatic hypotension felt to be related to mildly hydration as well as medications. Medications have been adjusted and BP has improved. Orthostatic symptoms have resolved.  2. Fecal impaction with a hx of laxative use. Continue bowel regimen. GI consulted for colonoscopy to r/o any colon mass. Patient and family decided to hold off on colonoscopy. Will consider colonoscopy as an outpatient. Continue xarelto. 3. Afib with RVR. Rate remains uncontrolled. Will increase coreg. He's anticoagulated with xarelto.  4. Pnuemonia. CXR revealed left lower pneumonia. Continue Zosyn.  5. Dysphagia. Speech was consulted and recommended regular diet with netrophic liquids. thick liquids. 6. Chronic diastolic CHF. Appears compensated at this time. Continue current treatments.  7. Confusion. Suspect the pt has some degree of dementia. He is exhibiting signs of sundowning. Will continue prn ativan.     DVT prophylaxis: Xarelto Code Status: Full Family Communication: Son and wife bedside Disposition Plan: Discharge to SNF when stable   Consultants:   Gastroenterology   Physical therapy   Procedures:    None  Antimicrobials:   Zosyn 8/7 >>   Subjective: Feeling well. Having some trouble breathing. No longer having chest pain.   Objective: Vitals:   11/21/15 1940 11/22/15 1113 11/22/15 1505 11/22/15 1810  BP:  (!) 162/107 139/90 129/71  Pulse:  (!) 126 (!) 117 89  Resp: (!) 24  20   Temp: 97.7 F (36.5 C)  98.7 F (37.1 C)   TempSrc:   Oral   SpO2: 91%  98%   Weight:      Height:        Intake/Output Summary (Last 24 hours) at 11/23/15 0710 Last data filed at 11/22/15 1752  Gross per 24 hour  Intake              113 ml  Output              125 ml  Net              -12 ml   Filed Weights   11/17/15 1328 11/17/15 2043 11/21/15 0100  Weight: 72.6 kg (160 lb) 67.4 kg (148 lb 11.2 oz) 67.4 kg (148 lb 11.2 oz)    Examination:  General exam: Appears calm and comfortable  Respiratory system: Crackles at left base. Respiratory effort normal. Cardiovascular system: Heart sounds irregular S1 & S2 heard. No JVD, murmurs, rubs, gallops or clicks. No pedal edema. Gastrointestinal system: Abdomen is nondistended, soft and nontender. No organomegaly or masses felt. Normal bowel sounds heard. Central nervous system: Alert and oriented. No focal neurological deficits. Extremities: Symmetric 5 x 5 power. Skin: No rashes, lesions or ulcers Psychiatry: Judgement and insight appear normal. Mood & affect appropriate.     Data  Reviewed: I have personally reviewed following labs and imaging studies  CBC:  Recent Labs Lab 11/17/15 1435 11/18/15 0655  WBC 7.9 15.3*  NEUTROABS 5.4  --   HGB 12.8* 14.2  HCT 37.8* 41.7  MCV 97.9 97.7  PLT 231 [id number]   Basic Metabolic Panel:  Recent Labs Lab 11/17/15 1435 11/18/15 0655  NA 134* 135  K 3.8 3.8  CL 95* 97*  CO2 30 27  GLUCOSE 93 101*  BUN 20 19  CREATININE 0.89 0.73  CALCIUM 9.4 9.3   GFR: Estimated Creatinine Clearance: 68 mL/min (by C-G formula based on SCr of 0.8 mg/dL). Liver Function Tests:  Recent Labs Lab  11/17/15 1435  AST 20  ALT 26  ALKPHOS 59  BILITOT 1.2  PROT 6.1*  ALBUMIN 3.6   No results for input(s): LIPASE, AMYLASE in the last 168 hours. No results for input(s): AMMONIA in the last 168 hours. Coagulation Profile:  Recent Labs Lab 11/17/15 1435  INR 1.62   Cardiac Enzymes:  Recent Labs Lab 11/17/15 1435 11/17/15 1712 11/17/15 2110 11/18/15 0242 11/18/15 1117  TROPONINI 0.05* 0.04* 0.04* 0.03* 0.03*   BNP (last 3 results) No results for input(s): PROBNP in the last 8760 hours. HbA1C: No results for input(s): HGBA1C in the last 72 hours. CBG: No results for input(s): GLUCAP in the last 168 hours. Lipid Profile: No results for input(s): CHOL, HDL, LDLCALC, TRIG, CHOLHDL, LDLDIRECT in the last 72 hours. Thyroid Function Tests: No results for input(s): TSH, T4TOTAL, FREET4, T3FREE, THYROIDAB in the last 72 hours. Anemia Panel: No results for input(s): VITAMINB12, FOLATE, FERRITIN, TIBC, IRON, RETICCTPCT in the last 72 hours. Sepsis Labs: No results for input(s): PROCALCITON, LATICACIDVEN in the last 168 hours.  No results found for this or any previous visit (from the past 240 hour(s)).       Radiology Studies: No results found.      Scheduled Meds: . carvedilol  6.25 mg Oral BID WC  . guaiFENesin  1,200 mg Oral BID  . lactulose  10 g Oral Daily  . nystatin-triamcinolone   Topical BID  . piperacillin-tazobactam (ZOSYN)  IV  3.375 g Intravenous Q8H  . polyethylene glycol  17 g Oral Daily  . rivaroxaban  20 mg Oral Q supper  . sodium chloride flush  3 mL Intravenous Q12H   Continuous Infusions:    LOS: 3 days    Time spent: 25 minutes     Kathie Dike, MD [facility] If 7PM-7AM, please contact night-coverage [email or web address] Password [email or web address] 11/23/2015, 7:10 AM   By signing my name below, I, Collene Leyden, attest that this documentation has been prepared under the direction and in the presence of Kathie Dike,  MD. Electronically signed: Collene Leyden, Scribe. 11/23/15 11:22 AM   I, Dr. Kathie Dike, personally performed the services described in this documentaiton. All medical record entries made by the scribe were at my direction and in my presence. I have reviewed the chart and agree that the record reflects my personal performance and is accurate and complete  Kathie Dike, MD, 11/23/2015 11:39 AM

## 2015-11-23 NOTE — Evaluation (Signed)
Occupational Therapy Evaluation Patient Details Name: Zachary Arellano MRN: [id number] DOB: 10-28-1931 Today's Date: 11/23/2015    History of Present Illness 82 yo M admitted 11/17/2015 s/p fall after getting dizzy upon standing and weak.  Pt also c/o of tenesmus and abdominal pelvic CT which showed obstipation, along with bladder wall thickening, and a right exophylic renal mass needing follow up.  Pt also with some confusion - dementia?  PMH: recent admission for fall 10/30/2015  and also found to have acute CHF that admission.  He was d/c home with HHPT because he was ambulating 519ft with RW and Mod I and negotiated 10 steps.  chronic Afib, moderate pulmonary HTN, HTN, rib fx, L cerebellum CVA, rib fx, last admission acute CHF.    Clinical Impression   Pt awake, alert, oriented to person and place. Wife and sitter present for evaluation. Pt displays deficits in cognition including problem solving, sequencing, and recall. During evaluation pt with consistent posterior lean during sitting and standing tasks, able to correct with verbal cuing in sitting, unable to correct while in standing requiring significant verbal and tactile cuing for safety. Pt requires mod assist +2 for bed to [facility] transfer, max assist for transfer back BSC to bed. Pt with significant difficulties sequencing and initiating tasks. Pt is unsafe to return home at discharge in present functional state, recommend SNF to improve safety and independence in ADL and functional mobility tasks, as well as increase strength and activity tolerance, while decreasing caregiver burden. Will continue to follow while in acute care.     Follow Up Recommendations  SNF;Supervision/Assistance - 24 hour    Equipment Recommendations  None recommended by OT       Precautions / Restrictions Precautions Precautions: Fall Restrictions Weight Bearing Restrictions: No      Mobility Bed Mobility Overal bed mobility: Needs Assistance Bed Mobility:  Supine to Sit;Sit to Supine     Supine to sit: Min guard;HOB elevated (verbal cuing for hand placement) Sit to supine: Mod assist      Transfers Overall transfer level: Needs assistance Equipment used: Rolling walker (2 wheeled) Transfers: Sit to/from [facility] Sit to Stand: Min assist Stand pivot transfers: Mod assist;+2 physical assistance       General transfer comment: Pt required two person assist from bed to [facility] for safety and sequencing. Pt unable to move feet during turn even with consistent verbal cuing         ADL Overall ADL's : Needs assistance/impaired                 Upper Body Dressing : Moderate assistance;Sitting Upper Body Dressing Details (indicate cue type and reason): Pt required assistance for gown arm holes and snaps Lower Body Dressing: Min guard;Sitting/lateral leans Lower Body Dressing Details (indicate cue type and reason): Pt able to doff and donn socks with increased time and mod difficulty. Pt brought foot to knee to donn socks, unable to keep foot on knee. Mod assist for maintaining sitting balance.  Toilet Transfer: BSC;RW;Moderate assistance;+2 for physical assistance Toilet Transfer Details (indicate cue type and reason): Pt began to have BM upon standing, requested BSC. Pt required max verbal cuing and mod assist +2 for stand pivot transfer to [facility]. Pt unable to sequence/problem solving turning, even with verbal cuing does not move feet to initiate or complete turn.  Toileting- Clothing Manipulation and Hygiene: Maximal assistance;Sit to/from stand Toileting - Clothing Manipulation Details (indicate cue type and reason): Pt required max assist for  toileting hygiene due to inability to maintaining sitting or standing balance independently. Mod assist for maintaining balance with RW, 2nd person performing hygiene task       General ADL Comments: Pt attempted functional mobility, unable to sequencing stepping safely, OT  providing mod assist and max verbal cuing for taking two steps     Vision Vision Assessment?: No apparent visual deficits          Pertinent Vitals/Pain Pain Assessment: No/denies pain     Hand Dominance Right   Extremity/Trunk Assessment Upper Extremity Assessment Upper Extremity Assessment: Generalized weakness   Lower Extremity Assessment Lower Extremity Assessment: Defer to PT evaluation       Communication Communication Communication: No difficulties   Cognition Arousal/Alertness: Awake/alert Behavior During Therapy: Flat affect Overall Cognitive Status: Impaired/Different from baseline Area of Impairment: Following commands;Safety/judgement;Awareness;Problem solving     Memory: Decreased short-term memory Following Commands: Follows one step commands inconsistently;Follows one step commands with increased time Safety/Judgement: Decreased awareness of safety;Decreased awareness of deficits   Problem Solving: Slow processing;Decreased initiation;Difficulty sequencing;Requires verbal cues;Requires tactile cues General Comments: Pt required one-step verbal cuing for sequencing during transfers and functional mobility task. Pt unable to initiate turn from bed to [facility] required verbal and 2 person assist to complete turn.               Home Living Family/patient expects to be discharged to:: Skilled nursing facility                                        Prior Functioning/Environment Level of Independence: Independent with assistive device(s)  Gait / Transfers Assistance Needed: Pt and family confirm that pt was independent with ambulation.  ADL's / Homemaking Assistance Needed: independent with dressing and bathing.    Comments: Pt required assistance for all tasks between hospital admissions from 2 weeks ago to this current admission. Pt stayed in bed during the week in between    OT Diagnosis: Generalized weakness;Cognitive deficits   OT  Problem List: Decreased strength;Decreased activity tolerance;Impaired balance (sitting and/or standing);Decreased cognition;Decreased safety awareness;Decreased knowledge of use of DME or AE;Decreased knowledge of precautions   OT Treatment/Interventions: Self-care/ADL training;Therapeutic exercise;DME and/or AE instruction;Therapeutic activities;Cognitive remediation/compensation;Patient/family education    OT Goals(Current goals can be found in the care plan section) Acute Rehab OT Goals Patient Stated Goal: Pt's family wants pt to regain strength and some independence in functional tasks OT Goal Formulation: With patient Time For Goal Achievement: 12/07/15 Potential to Achieve Goals: Good  OT Frequency: Min 2X/week   Barriers to D/C: Decreased caregiver support  Pt wife unable to care for pt in current state          End of Session Equipment Utilized During Treatment: Gait belt;Rolling walker  Activity Tolerance: Patient tolerated treatment well Patient left: in bed;with call bell/phone within reach;with family/visitor present;with nursing/sitter in room   Time: [id number] OT Time Calculation (min): 43 min Charges:  OT General Charges $OT Visit: 1 Procedure OT Evaluation $OT Eval Moderate Complexity: 1 Procedure   Guadelupe Sabin, OTR/L  [phone] 11/23/2015, 11:06 AM

## 2015-11-23 NOTE — Progress Notes (Signed)
Pt asleep and resting comfortably. 

## 2015-11-24 DIAGNOSIS — R531 Weakness: Secondary | ICD-10-CM

## 2015-11-24 LAB — BASIC METABOLIC PANEL
ANION GAP: 7 (ref 5–15)
BUN: 14 mg/dL (ref 6–20)
CALCIUM: 8.7 mg/dL — AB (ref 8.9–10.3)
CO2: 32 mmol/L (ref 22–32)
Chloride: 101 mmol/L (ref 101–111)
Creatinine, Ser: 0.88 mg/dL (ref 0.61–1.24)
GFR calc non Af Amer: >60 mL/min (ref >60)
Glucose, Bld: 100 mg/dL — ABNORMAL HIGH (ref 65–99)
Potassium: 2.9 mmol/L — ABNORMAL LOW (ref 3.5–5.1)
Sodium: 140 mmol/L (ref 135–145)

## 2015-11-24 MED ORDER — DILTIAZEM HCL 60 MG PO TABS
60.0000 mg | ORAL_TABLET | Freq: Three times a day (TID) | ORAL | Status: DC
  Administered 2015-11-24 – 2015-11-25 (×2): 60 mg via ORAL
  Filled 2015-11-24 (×3): qty 1

## 2015-11-24 MED ORDER — POTASSIUM CHLORIDE 20 MEQ PO PACK
40.0000 meq | PACK | ORAL | Status: AC
  Administered 2015-11-24 (×3): 40 meq via ORAL
  Filled 2015-11-24 (×3): qty 2

## 2015-11-24 NOTE — Care Management Important Message (Signed)
Important Message  Patient Details  Name: Zachary Arellano MRN: [id number] Date of Birth: April 25, 1931   Medicare Important Message Given:  Yes    Sherald Barge, RN 11/24/2015, 9:36 AM

## 2015-11-24 NOTE — Progress Notes (Signed)
PROGRESS NOTE    Zachary Arellano  [id number] DOB: 1931-11-28 DOA: 11/17/2015 PCP: [name], MD    Brief Narrative:  82 yom with a hx of chronic afib, HTN, prior CVA, glaucoma, and frequent falls presented with complaints of  dizziness and mechanical fall. While begin evaluated he was noted to have hypotension. He was admitted for further evaluation of orthostatic hypotension. Gastroenterology and physical therapy have been consulted.    Assessment & Plan:   Principal Problem:   Orthostatic hypotension Active Problems:   Atrial fibrillation (HCC)   Essential hypertension   Current use of long term anticoagulation   General weakness   Tachycardia-bradycardia syndrome (HCC)   Chronic diastolic CHF (congestive heart failure) (HCC)   HCAP (healthcare-associated pneumonia)   Hypotension  1. Orthostatic hypotension felt to be related to mildly hydration as well as medications. Medications have been adjusted and BP has improved but remains elevated. Orthostatic symptoms have resolved.  2. Fecal impaction with a hx of laxative use. Continue bowel regimen. GI consulted for colonoscopy to r/o any colon mass. Patient and family decided to hold off on colonoscopy. Will consider colonoscopy as an outpatient. Continue xarelto. 3. Afib with RVR. Rate remains uncontrolled. Continue coreg. Increase diltiazem. Continue anticoagulation with xarelto.  4. Pnuemonia. CXR revealed left lower PNA. Continue Zosyn.  5. Dysphagia. Speech was consulted and recommended regular diet with nectar thick liquids.. 6. Chronic diastolic CHF. Appears compensated at this time. Continue current treatments.  7. Confusion. Suspect the pt has some degree of dementia. He is exhibiting signs of sundowning. Will continue prn ativan.     DVT prophylaxis: Xarelto Code Status: Full Family Communication: discussed with son at the bedside Disposition Plan: Discharge to SNF when stable   Consultants:   Gastroenterology    Physical therapy   Procedures:   None  Antimicrobials:   Zosyn 8/7 >>   Subjective: No shortness of breath. Feeling better.  Objective: Vitals:   11/23/15 1319 11/23/15 1710 11/23/15 2106 11/24/15 0551  BP: 125/72 119/82 (!) 127/96 (!) 151/92  Pulse: (!) 111 (!) 103 (!) 111 (!) 111  Resp:   20 20  Temp:   98.5 F (36.9 C) 97.5 F (36.4 C)  TempSrc:   Oral Oral  SpO2:   97% 98%  Weight:      Height:        Intake/Output Summary (Last 24 hours) at 11/24/15 0645 Last data filed at 11/23/15 1802  Gross per 24 hour  Intake              360 ml  Output              300 ml  Net               60 ml   Filed Weights   11/17/15 1328 11/17/15 2043 11/21/15 0100  Weight: 72.6 kg (160 lb) 67.4 kg (148 lb 11.2 oz) 67.4 kg (148 lb 11.2 oz)    Examination:  General exam: Appears calm and comfortable  Respiratory system: Crackles at left base. Respiratory effort normal. Cardiovascular system: S1 & S2 heard, irregular. No JVD, murmurs, rubs, gallops or clicks. No pedal edema. Gastrointestinal system: Abdomen is nondistended, soft and nontender. No organomegaly or masses felt. Normal bowel sounds heard. Central nervous system: Alert and oriented. No focal neurological deficits. Extremities: Symmetric 5 x 5 power. Skin: No rashes, lesions or ulcers Psychiatry: Judgement and insight appear normal. Mood & affect appropriate.  Data Reviewed: I have personally reviewed following labs and imaging studies  CBC:  Recent Labs Lab 11/17/15 1435 11/18/15 0655  WBC 7.9 15.3*  NEUTROABS 5.4  --   HGB 12.8* 14.2  HCT 37.8* 41.7  MCV 97.9 97.7  PLT 231 [id number]   Basic Metabolic Panel:  Recent Labs Lab 11/17/15 1435 11/18/15 0655 11/23/15 0558  NA 134* 135 141  K 3.8 3.8 3.2*  CL 95* 97* 101  CO2 30 27 31   GLUCOSE 93 101* 96  BUN 20 19 16   CREATININE 0.89 0.73 0.79  CALCIUM 9.4 9.3 8.6*   GFR: Estimated Creatinine Clearance: 68 mL/min (by C-G formula based on SCr  of 0.8 mg/dL). Liver Function Tests:  Recent Labs Lab 11/17/15 1435  AST 20  ALT 26  ALKPHOS 59  BILITOT 1.2  PROT 6.1*  ALBUMIN 3.6   No results for input(s): LIPASE, AMYLASE in the last 168 hours. No results for input(s): AMMONIA in the last 168 hours. Coagulation Profile:  Recent Labs Lab 11/17/15 1435  INR 1.62   Cardiac Enzymes:  Recent Labs Lab 11/17/15 1435 11/17/15 1712 11/17/15 2110 11/18/15 0242 11/18/15 1117  TROPONINI 0.05* 0.04* 0.04* 0.03* 0.03*   BNP (last 3 results) No results for input(s): PROBNP in the last 8760 hours. HbA1C: No results for input(s): HGBA1C in the last 72 hours. CBG: No results for input(s): GLUCAP in the last 168 hours. Lipid Profile: No results for input(s): CHOL, HDL, LDLCALC, TRIG, CHOLHDL, LDLDIRECT in the last 72 hours. Thyroid Function Tests: No results for input(s): TSH, T4TOTAL, FREET4, T3FREE, THYROIDAB in the last 72 hours. Anemia Panel: No results for input(s): VITAMINB12, FOLATE, FERRITIN, TIBC, IRON, RETICCTPCT in the last 72 hours. Sepsis Labs: No results for input(s): PROCALCITON, LATICACIDVEN in the last 168 hours.  No results found for this or any previous visit (from the past 240 hour(s)).       Radiology Studies: No results found.      Scheduled Meds: . brimonidine  1 drop Both Eyes BID   And  . timolol  1 drop Both Eyes BID  . carvedilol  12.5 mg Oral BID WC  . diltiazem  60 mg Oral BID  . guaiFENesin  1,200 mg Oral BID  . lactulose  10 g Oral Daily  . latanoprost  1 drop Both Eyes Daily  . nystatin-triamcinolone   Topical BID  . pilocarpine  1 drop Both Eyes QID  . piperacillin-tazobactam (ZOSYN)  IV  3.375 g Intravenous Q8H  . polyethylene glycol  17 g Oral Daily  . rivaroxaban  20 mg Oral Q supper  . sodium chloride flush  3 mL Intravenous Q12H   Continuous Infusions:    LOS: 4 days    Time spent: 25 minutes     Kathie Dike, MD [facility] If 7PM-7AM, please  contact night-coverage [email or web address] Password [facility] 11/24/2015, 6:45 AM

## 2015-11-24 NOTE — Progress Notes (Addendum)
Physical Therapy Treatment Patient Details Name: Zachary Arellano MRN: [id number] DOB: [birth year]p Today's Date: 11/24/2015    History of Present Illness 82 yo M admitted 11/17/2015 s/p fall after getting dizzy upon standing and weak.  Pt also c/o of tenesmus and abdominal pelvic CT which showed obstipation, along with bladder wall thickening, and a right exophylic renal mass needing follow up.  Pt also with some confusion - dementia?, Afib with RVR.   PMH: recent admission for fall 10/30/2015  and also found to have acute CHF that admission.  He was d/c home with HHPT because he was ambulating 594ft with RW and Mod I and negotiated 10 steps.  chronic Afib, moderate pulmonary HTN, HTN, rib fx, L cerebellum CVA, rib fx, last admission acute CHF.     PT Comments    Pt received in bed, sitter, aid, and RN present, and pt was agreeable to PT tx, however, he expressed that he was tired, and couldn't do much.  Pt continues to remain confused, which has been impacting his mobility.  He required Mod A for supine<>sit and min/mod A for sit<>stand and stand pivot transfer from the bed<>chair.  Pt continues to require assistance due to poor initiation of task, festinating gait pattern, and poor safety awareness.  Pt expressed dislike of gait belt, and PT educated pt that it was for safety and to assist the patient.  Continue to recommend SNF at this point due to the previously mentioned deficits as pt will need continued PT to continue his progressing with strengthening, balance training, and safety education.    Follow Up Recommendations  SNF     Equipment Recommendations  None recommended by PT    Recommendations for Other Services       Precautions / Restrictions Precautions Precautions: Fall Precaution Comments: Reason for admission, and previous admission for same.  Wife reports that he passes out and falls out of the bed.  Restrictions Weight Bearing Restrictions: No    Mobility  Bed  Mobility Overal bed mobility: Needs Assistance Bed Mobility: Supine to Sit Rolling: Mod assist (Poor ability to sequence this task, and required assistance to roll to get off bed pan and don brief for safety during ambulation/transfers. )   Supine to sit: Mod assist (RN present and also assisting pt with transfer. )        Transfers Overall transfer level: Needs assistance Equipment used: Rolling walker (2 wheeled) Transfers: Sit to/from Stand Sit to Stand: Min assist (Multiple requests to push up from the bed, but pt ended up with 1 hand on the bed and 1 on the RW to stand. ) Stand pivot transfers: Min assist;Mod assist       General transfer comment: Pt expressed that he is only willing to transfer from the bed <>chair today.  PT assisted pt with Fwd progression of the RW.  Pt continues to demonstrate a festinating gait pattern to perform transfer, as well as continued crouched posture.   Ambulation/Gait Ambulation/Gait assistance:  (NA)               Stairs            Wheelchair Mobility    Modified Rankin (Stroke Patients Only)       Balance Overall balance assessment: Needs assistance Sitting-balance support: Bilateral upper extremity supported       Standing balance support: Bilateral upper extremity supported Standing balance-Leahy Scale: Fair  Cognition Arousal/Alertness: Awake/alert Behavior During Therapy: Flat affect;Agitated (Pt making comments such as "what are you conspring to do."  "I can get a little angry." ) Overall Cognitive Status: Impaired/Different from baseline Area of Impairment: Following commands;Safety/judgement;Awareness;Problem solving     Memory: Decreased short-term memory Following Commands: Follows one step commands inconsistently;Follows one step commands with increased time Safety/Judgement: Decreased awareness of safety;Decreased awareness of deficits   Problem Solving: Slow  processing;Decreased initiation;Difficulty sequencing;Requires verbal cues;Requires tactile cues      Exercises      General Comments        Pertinent Vitals/Pain Pain Assessment: No/denies pain    Home Living                      Prior Function            PT Goals (current goals can now be found in the care plan section) Acute Rehab PT Goals Patient Stated Goal: Improve strength PT Goal Formulation: With patient/family Time For Goal Achievement: 12/04/15 Potential to Achieve Goals: Fair Progress towards PT goals: Not progressing toward goals - comment    Frequency  Min 5X/week    PT Plan Current plan remains appropriate    Co-evaluation             End of Session Equipment Utilized During Treatment: Gait belt Activity Tolerance: Patient limited by fatigue Patient left: in chair;with nursing/sitter in room;with family/visitor present     Time: 1255-1315 PT Time Calculation (min) (ACUTE ONLY): 20 min  Charges:  $Therapeutic Activity: 8-22 mins                    G Codes:      Beth Tikia Skilton, PT, DPT X: [phone]

## 2015-11-25 LAB — BASIC METABOLIC PANEL
Anion gap: 8 (ref 5–15)
BUN: 13 mg/dL (ref 6–20)
CO2: 29 mmol/L (ref 22–32)
CREATININE: 0.86 mg/dL (ref 0.61–1.24)
Calcium: 8.7 mg/dL — ABNORMAL LOW (ref 8.9–10.3)
Chloride: 101 mmol/L (ref 101–111)
GFR calc Af Amer: >60 mL/min (ref >60)
GLUCOSE: 99 mg/dL (ref 65–99)
Potassium: 3.3 mmol/L — ABNORMAL LOW (ref 3.5–5.1)
SODIUM: 138 mmol/L (ref 135–145)

## 2015-11-25 MED ORDER — POTASSIUM CHLORIDE 20 MEQ PO PACK
40.0000 meq | PACK | ORAL | Status: AC
  Administered 2015-11-25 (×2): 40 meq via ORAL
  Filled 2015-11-25 (×2): qty 2

## 2015-11-25 MED ORDER — DILTIAZEM HCL 60 MG PO TABS
60.0000 mg | ORAL_TABLET | Freq: Two times a day (BID) | ORAL | Status: DC
  Administered 2015-11-25 – 2015-11-26 (×3): 60 mg via ORAL
  Filled 2015-11-25 (×3): qty 1

## 2015-11-25 NOTE — Progress Notes (Signed)
PROGRESS NOTE    Zachary Arellano  [id number] DOB: [birth year] DOA: 11/17/2015 PCP: [name], MD    Brief Narrative:  26 yom with a hx of chronic afib, HTN, prior CVA, glaucoma, and frequent falls presented with complaints of  dizziness and mechanical fall. While begin evaluated he was noted to have hypotension. He was admitted for further evaluation of orthostatic hypotension. Gastroenterology has been consulted for possible colonoscopy, however, patient and family decided to consider procedure as an outpatient. Physical therapy has also evaluated the patient and recommends SNF upon discharge.    Assessment & Plan:   Principal Problem:   Orthostatic hypotension Active Problems:   Atrial fibrillation (HCC)   Essential hypertension   Current use of long term anticoagulation   General weakness   Tachycardia-bradycardia syndrome (HCC)   Chronic diastolic CHF (congestive heart failure) (HCC)   HCAP (healthcare-associated pneumonia)   Hypotension  1. Orthostatic hypotension felt to be related to mildly hydration as well as medications. Medications have been adjusted and BP has improved but remains elevated. Orthostatic symptoms have resolved.  2. Fecal impaction with a hx of laxative use. Continue bowel regimen. GI consulted for colonoscopy to r/o any colon mass. Patient and family decided to hold off on colonoscopy. Will consider colonoscopy as an outpatient. 3. Afib with RVR. Improvement in heart rate with coreg and diltiazem. Continue coreg and diltiazem. Continue anticoagulation with xarelto.  4. HCAP. CXR revealed left lower PNA. He has completed a course of antibiotics.  5. Dysphagia. Speech was consulted and recommended regular diet with nectar thick liquids. 6. Chronic diastolic CHF. Appears compensated at this time. Continue current treatments.  7. Confusion. Suspect the pt has some degree of dementia. He is exhibiting signs of sundowning. Will continue prn ativan.  8. Right kidney  mass. Will need further follow up such as repeat CT abdomen or MRI abdomen. This can be done as an outpatient    DVT prophylaxis: Xarelto Code Status: Full Family Communication: no family present Disposition Plan: Discharge to SNF when stable   Consultants:   Gastroenterology   Physical therapy   Procedures:   None  Antimicrobials:   Zosyn 8/7 >>8/12   Subjective: No chest pain or shortness of breath  Objective: Vitals:   11/24/15 1300 11/24/15 1819 11/24/15 2245 11/25/15 0649  BP: 100/70   (!) 149/92  Pulse: 86 97 93 (!) 58  Resp:   20 20  Temp:  98.4 F (36.9 C)  98.1 F (36.7 C)  TempSrc:  Oral  Oral  SpO2:   93% 100%  Weight:      Height:        Intake/Output Summary (Last 24 hours) at 11/25/15 0737 Last data filed at 11/24/15 1730  Gross per 24 hour  Intake              600 ml  Output                0 ml  Net              600 ml   Filed Weights   11/17/15 1328 11/17/15 2043 11/21/15 0100  Weight: 72.6 kg (160 lb) 67.4 kg (148 lb 11.2 oz) 67.4 kg (148 lb 11.2 oz)   Examination:  General exam: Appears calm and comfortable  Respiratory system: Clear to auscultation. Respiratory effort normal. Cardiovascular system: S1 & S2 heard, irregular. No JVD, murmurs, rubs, gallops or clicks. No pedal edema. Gastrointestinal system: Abdomen is nondistended, soft  and nontender. No organomegaly or masses felt. Normal bowel sounds heard. Central nervous system: Alert and oriented. No focal neurological deficits. Extremities: Symmetric 5 x 5 power. Skin: No rashes, lesions or ulcers Psychiatry: Judgement and insight appear normal. Mood & affect appropriate.   Data Reviewed: I have personally reviewed following labs and imaging studies  CBC: No results for input(s): WBC, NEUTROABS, HGB, HCT, MCV, PLT in the last 168 hours. Basic Metabolic Panel:  Recent Labs Lab 11/23/15 0558 11/24/15 0602 11/25/15 0617  NA 141 140 138  K 3.2* 2.9* 3.3*  CL 101 101 101    CO2 31 32 29  GLUCOSE 96 100* 99  BUN 16 14 13   CREATININE 0.79 0.88 0.86  CALCIUM 8.6* 8.7* 8.7*   Cardiac Enzymes:  Recent Labs Lab 11/18/15 1117  TROPONINI 0.03*    Scheduled Meds: . brimonidine  1 drop Both Eyes BID   And  . timolol  1 drop Both Eyes BID  . carvedilol  12.5 mg Oral BID WC  . diltiazem  60 mg Oral Q8H  . guaiFENesin  1,200 mg Oral BID  . lactulose  10 g Oral Daily  . latanoprost  1 drop Both Eyes Daily  . nystatin-triamcinolone   Topical BID  . pilocarpine  1 drop Both Eyes QID  . piperacillin-tazobactam (ZOSYN)  IV  3.375 g Intravenous Q8H  . polyethylene glycol  17 g Oral Daily  . rivaroxaban  20 mg Oral Q supper  . sodium chloride flush  3 mL Intravenous Q12H   Continuous Infusions:    LOS: 5 days    Time spent: 25 minutes     Kathie Dike, MD [facility] If 7PM-7AM, please contact night-coverage [email or web address] Password [facility] 11/25/2015, 7:37 AM   By signing my name below, I, Delene Ruffini, attest that this documentation has been prepared under the direction and in the presence of Kathie Dike, MD. Electronically Signed: Delene Ruffini 11/25/15

## 2015-11-26 DIAGNOSIS — J690 Pneumonitis due to inhalation of food and vomit: Secondary | ICD-10-CM

## 2015-11-26 DIAGNOSIS — I4891 Unspecified atrial fibrillation: Secondary | ICD-10-CM

## 2015-11-26 LAB — BASIC METABOLIC PANEL
Anion gap: 5 (ref 5–15)
BUN: 16 mg/dL (ref 6–20)
CHLORIDE: 105 mmol/L (ref 101–111)
CO2: 31 mmol/L (ref 22–32)
CREATININE: 0.78 mg/dL (ref 0.61–1.24)
Calcium: 8.8 mg/dL — ABNORMAL LOW (ref 8.9–10.3)
GFR calc non Af Amer: >60 mL/min (ref >60)
GLUCOSE: 102 mg/dL — AB (ref 65–99)
Potassium: 3.4 mmol/L — ABNORMAL LOW (ref 3.5–5.1)
Sodium: 141 mmol/L (ref 135–145)

## 2015-11-26 MED ORDER — POTASSIUM CHLORIDE 20 MEQ PO PACK
40.0000 meq | PACK | Freq: Two times a day (BID) | ORAL | Status: DC
  Administered 2015-11-26 – 2015-11-27 (×3): 40 meq via ORAL
  Filled 2015-11-26 (×3): qty 2

## 2015-11-26 MED ORDER — DILTIAZEM HCL 60 MG PO TABS
60.0000 mg | ORAL_TABLET | Freq: Four times a day (QID) | ORAL | Status: DC
  Administered 2015-11-26 – 2015-11-27 (×4): 60 mg via ORAL
  Filled 2015-11-26 (×3): qty 1

## 2015-11-26 MED ORDER — DILTIAZEM HCL 60 MG PO TABS
60.0000 mg | ORAL_TABLET | Freq: Once | ORAL | Status: AC
  Administered 2015-11-26: 60 mg via ORAL
  Filled 2015-11-26: qty 1

## 2015-11-26 MED ORDER — STARCH (THICKENING) PO POWD
ORAL | Status: DC | PRN
  Filled 2015-11-26: qty 227

## 2015-11-26 NOTE — Progress Notes (Signed)
PROGRESS NOTE    Zachary Arellano  [id number] DOB: 04-22-1931 DOA: 11/17/2015 PCP: [name], MD    Brief Narrative:  27 yom with a hx of chronic afib, HTN, prior CVA, glaucoma, and frequent falls presented with complaints of  dizziness and mechanical fall. While being evaluated, he was noted to have hypotension. He was admitted for further evaluation of orthostatic hypotension. Gastroenterology has been consulted for possible colonoscopy, however, patient and family decided to consider procedure as an outpatient. Physical therapy has also evaluated the patient and recommends SNF upon discharge. Pt will likely need a couple more days of treatment.   Assessment & Plan:   Principal Problem:   Orthostatic hypotension Active Problems:   Atrial fibrillation (HCC)   Essential hypertension   Current use of long term anticoagulation   General weakness   Tachycardia-bradycardia syndrome (HCC)   Chronic diastolic CHF (congestive heart failure) (HCC)   HCAP (healthcare-associated pneumonia)   Hypotension  1. Orthostatic hypotension felt to be related to mildly hydration as well as medications. Medications have been adjusted and BP has improved but now remains elevated. Orthostatic symptoms have resolved.  2. Fecal impaction with a hx of laxative use. Continue bowel regimen. GI consulted for colonoscopy to r/o any colon mass. Patient and family decided to hold off on colonoscopy. Will consider colonoscopy as an outpatient. 3. Afib with RVR. Heart rate remains elevated. Will increase diltiazem dosing. Continue coreg and diltiazem. Continue anticoagulation with xarelto.  4. Aspiration pneumonia. With patient's underlying dysphagia, aspiration is likely cause of pneumonia. CXR revealed left lower PNA. He has completed a course of antibiotics.  5. Dysphagia. Speech was consulted and recommended regular diet with nectar thick liquids. 6. Chronic diastolic CHF. ECHO on 7/21 revealed an EF of 55%. Appears  compensated at this time. Continue current treatments.  7. Confusion. Suspect the pt has some degree of dementia. He is exhibiting signs of sundowning. Will continue prn ativan.  8. Right kidney mass. Will need further follow up such as repeat CT abdomen or MRI abdomen. This can be done as an outpatient   DVT prophylaxis: Xarelto Code Status: Full Family Communication: no family present Disposition Plan: Discharge to SNF when stable   Consultants:   Gastroenterology   Physical therapy   Procedures:   None  Antimicrobials:   Zosyn 8/7 >>8/12   Subjective: No shortness of breath. No chest pain.  Objective: Vitals:   11/25/15 1419 11/25/15 1658 11/25/15 2202 11/26/15 0650  BP: 116/67 115/69 137/83 (!) 151/96  Pulse: 86  90 (!) 114  Resp:   16 16  Temp: 97.5 F (36.4 C)  97.8 F (36.6 C) 97.7 F (36.5 C)  TempSrc: Oral  Oral Oral  SpO2:   97% 97%  Weight:      Height:        Intake/Output Summary (Last 24 hours) at 11/26/15 0720 Last data filed at 11/25/15 1900  Gross per 24 hour  Intake              360 ml  Output              100 ml  Net              260 ml   Filed Weights   11/17/15 1328 11/17/15 2043 11/21/15 0100  Weight: 72.6 kg (160 lb) 67.4 kg (148 lb 11.2 oz) 67.4 kg (148 lb 11.2 oz)    Examination:  General exam: Appears calm and comfortable  Respiratory  system: Clear to auscultation. Respiratory effort normal. Cardiovascular system: S1 & S2 heard, irregular. No JVD, murmurs, rubs, gallops or clicks. No pedal edema. Gastrointestinal system: Abdomen is nondistended, soft and nontender. No organomegaly or masses felt. Normal bowel sounds heard. Central nervous system: Alert and oriented. No focal neurological deficits. Extremities: Symmetric 5 x 5 power. Skin: No rashes, lesions or ulcers Psychiatry: Judgement and insight appear normal. Mood & affect appropriate.   Data Reviewed: I have personally reviewed following labs and imaging  studies  CBC: No results for input(s): WBC, NEUTROABS, HGB, HCT, MCV, PLT in the last 168 hours. Basic Metabolic Panel:  Recent Labs Lab 11/23/15 0558 11/24/15 0602 11/25/15 0617  NA 141 140 138  K 3.2* 2.9* 3.3*  CL 101 101 101  CO2 31 32 29  GLUCOSE 96 100* 99  BUN 16 14 13   CREATININE 0.79 0.88 0.86  CALCIUM 8.6* 8.7* 8.7*   Cardiac Enzymes: No results for input(s): CKTOTAL, CKMB, CKMBINDEX, TROPONINI in the last 168 hours.  Scheduled Meds: . brimonidine  1 drop Both Eyes BID   And  . timolol  1 drop Both Eyes BID  . carvedilol  12.5 mg Oral BID WC  . diltiazem  60 mg Oral BID  . guaiFENesin  1,200 mg Oral BID  . lactulose  10 g Oral Daily  . latanoprost  1 drop Both Eyes Daily  . nystatin-triamcinolone   Topical BID  . pilocarpine  1 drop Both Eyes QID  . polyethylene glycol  17 g Oral Daily  . rivaroxaban  20 mg Oral Q supper  . sodium chloride flush  3 mL Intravenous Q12H   Continuous Infusions:    LOS: 6 days   Time spent: 25 minutes   Kathie Dike, MD [facility] If 7PM-7AM, please contact night-coverage [email or web address] Password [email or web address] 11/26/2015, 7:20 AM

## 2015-11-27 ENCOUNTER — Inpatient Hospital Stay
Admission: RE | Admit: 2015-11-27 | Discharge: 2016-01-02 | Disposition: A | Discharge status: Home / Self Care | Payer: Medicare Other | Source: Ambulatory Visit | Attending: Internal Medicine | Admitting: Internal Medicine

## 2015-11-27 DIAGNOSIS — R93429 Abnormal radiologic findings on diagnostic imaging of unspecified kidney: Principal | ICD-10-CM

## 2015-11-27 LAB — BASIC METABOLIC PANEL
ANION GAP: 7 (ref 5–15)
BUN: 15 mg/dL (ref 6–20)
CALCIUM: 9 mg/dL (ref 8.9–10.3)
CHLORIDE: 106 mmol/L (ref 101–111)
CO2: 30 mmol/L (ref 22–32)
Creatinine, Ser: 0.77 mg/dL (ref 0.61–1.24)
GFR calc Af Amer: >60 mL/min (ref >60)
GFR calc non Af Amer: >60 mL/min (ref >60)
GLUCOSE: 93 mg/dL (ref 65–99)
Potassium: 3.6 mmol/L (ref 3.5–5.1)
Sodium: 143 mmol/L (ref 135–145)

## 2015-11-27 MED ORDER — DILTIAZEM HCL 60 MG PO TABS: 60.0000 mg | ORAL_TABLET | Freq: Four times a day (QID) | ORAL | Status: DC

## 2015-11-27 MED ORDER — CARVEDILOL 25 MG PO TABS: 25.0000 mg | ORAL_TABLET | Freq: Two times a day (BID) | ORAL | Status: AC

## 2015-11-27 MED ORDER — POLYETHYLENE GLYCOL 3350 17 G PO PACK: 17.0000 g | PACK | Freq: Every day | ORAL | 0 refills | Status: AC

## 2015-11-27 NOTE — Clinical Social Work Placement (Signed)
   CLINICAL SOCIAL WORK PLACEMENT  NOTE  Date:  11/27/2015  Patient Details  Name: Zachary Arellano MRN: [id number] Date of Birth: 1931/05/29  Clinical Social Work is seeking post-discharge placement for this patient at the [place] level of care (*CSW will initial, date and re-position this form in  chart as items are completed):  Yes   Patient/family provided with [place] Work Department's list of facilities offering this level of care within the geographic area requested by the patient (or if unable, by the patient's family).  Yes   Patient/family informed of their freedom to choose among providers that offer the needed level of care, that participate in Medicare, Medicaid or managed care program needed by the patient, have an available bed and are willing to accept the patient.  Yes   Patient/family informed of [facility]'s ownership interest in [facility] and [facility], as well as of the fact that they are under no obligation to receive care at these facilities.  PASRR submitted to EDS on 11/21/15     PASRR number received on 11/21/15     Existing PASRR number confirmed on       FL2 transmitted to all facilities in geographic area requested by pt/family on 11/21/15     FL2 transmitted to all facilities within larger geographic area on       Patient informed that his/her managed care company has contracts with or will negotiate with certain facilities, including the following:        Yes   Patient/family informed of bed offers received.  Patient chooses bed at [facility]     Physician recommends and patient chooses bed at      Patient to be transferred to [facility] on 11/27/15.  Patient to be transferred to facility by [facility] hospital staff. Patient going to room 124.      Patient family notified on 11/27/15 of transfer.  Name of family member notified:  Myshawn Forbush, son.      PHYSICIAN       Additional  Comment:  Clinicals sent via [employer]. CSW signing off.    _______________________________________________ Ihor Gully, LCSW 11/27/2015, 11:46 AM

## 2015-11-27 NOTE — Care Management Important Message (Signed)
Important Message  Patient Details  Name: ALIJHA RUMMEL MRN: [id number] Date of Birth: [birth year]   Medicare Important Message Given:  Yes    Sherald Barge, RN 11/27/2015, 11:33 AM

## 2015-11-27 NOTE — Discharge Summary (Signed)
Physician Discharge Summary  Zachary Arellano [id number] DOB: [birth year] DOA: 11/17/2015  PCP: [name], MD  Admit date: 11/17/2015 Discharge date: 11/27/2015  Admitted From: home Disposition:  SNF: [facility]  Recommendations for Outpatient Follow-up:  1. Follow up with PCP in 1-2 weeks 2. Please obtain BMP/CBC in one week 3. Follow up with Dr. Laural Golden to consider outpatient colonoscopy 4. Outpatient MRI abdomen to evaluate right kidney mass  Home Health: Equipment/Devices:  Discharge Condition:stable CODE STATUS: full Diet recommendation: heart healthy with nectar thick liquids  Brief/Interim Summary: 82 y/o male who was admitted after a mechanical fall. He was noted to have orthostatic hypotension which likely caused his fall. This was felt to be related to his medications. Once medications were adjusted, he did not have any further orthostatic symptoms.  He was also noted to have significant constipation and fecal impaction. He was placed on an aggressive bowel regimen and seen by GI. Plans are for outpatient colonoscopy  Hospital course was complicated by development of rapid atrial fibrillation. His medications have been adjusted and he was started on coreg and cardizem. He is anticoagulated with xarelto  Patient was noted to have significant dysphagia and was seen by speech therapy. Recommendations were for regular consistency food but with nectar thick liquids. He was noted to have a pneumonia which was felt to be an aspiration pneumonia. This has been adequately treated in the hospital. He is not febrile and overall respiratory status is stable on room air.  Patient has had some confusion in the hospital and sundowning. He likely has some degree of dementia. This can be followed by PCP and treated accordingly  Patient did have incidental finding of right kidney lesion on abdominal imaging. This will need further evaluation with MRI abdomen, but can be done as an outpatient. He  may need an open MRI due to claustrophobia.  Discharge Diagnoses:  Principal Problem:   Orthostatic hypotension Active Problems:   Atrial fibrillation (HCC)   Essential hypertension   Current use of long term anticoagulation   General weakness   Tachycardia-bradycardia syndrome (HCC)   Chronic diastolic CHF (congestive heart failure) (HCC)   HCAP (healthcare-associated pneumonia)   Hypotension   Atrial fibrillation with [facility])    Discharge Instructions  Discharge Instructions    Diet - low sodium heart healthy    Complete by:  As directed   Increase activity slowly    Complete by:  As directed       Medication List    STOP taking these medications   acetaZOLAMIDE 250 MG tablet Commonly known as:  DIAMOX   atenolol 50 MG tablet Commonly known as:  TENORMIN   cloNIDine 0.1 MG tablet Commonly known as:  CATAPRES   diltiazem 180 MG 24 hr capsule Commonly known as:  CARDIZEM CD     TAKE these medications   B-complex with vitamin C tablet Take 1 tablet by mouth daily.   bimatoprost 0.03 % ophthalmic solution Commonly known as:  LUMIGAN Place 1 drop into both eyes daily.   carvedilol 25 MG tablet Commonly known as:  COREG Take 1 tablet (25 mg total) by mouth 2 (two) times daily with a meal.   COMBIGAN 0.2-0.5 % ophthalmic solution Generic drug:  brimonidine-timolol Place 1 drop into both eyes 2 (two) times daily.   diltiazem 60 MG tablet Commonly known as:  CARDIZEM Take 1 tablet (60 mg total) by mouth every 6 (six) hours.   docusate sodium 100 MG capsule Commonly  known as:  COLACE Take 1 capsule (100 mg total) by mouth daily.   multivitamin with minerals Tabs tablet Take 1 tablet by mouth daily.   pilocarpine 2 % ophthalmic solution Commonly known as:  PILOCAR Place 1 drop into both eyes 4 (four) times daily.   polyethylene glycol packet Commonly known as:  MIRALAX / GLYCOLAX Take 17 g by mouth daily.   SYSTANE 0.4-0.3 % Soln Generic drug:   Polyethyl Glycol-Propyl Glycol Apply 2 drops to eye 2 (two) times daily.   VISION FORMULA EYE HEALTH Caps Take 1 capsule by mouth daily.   XARELTO 20 MG Tabs tablet Generic drug:  rivaroxaban TAKE ONE (1) TABLET EACH DAY       Contact information for follow-up providers    [name], MD.   Specialty:  Internal Medicine Contact information: [address] [place] [address] [phone]            Contact information for after-discharge care    [facility] .   Specialty:  Skilled Nursing Facility Contact information: [address] [place] [phone]                 No Known Allergies  Consultations:  Gastroenterology   Procedures/Studies: Dg Chest 2 View  Result Date: 11/20/2015 CLINICAL DATA:  Pneumonia followup EXAM: CHEST  2 VIEW COMPARISON:  11/17/2015. FINDINGS: Mild cardiac enlargement. Aortic atherosclerosis is noted. There is a a opacity in the posterior left base compatible with pneumonia. IMPRESSION: Left base pneumonia. Aortic atherosclerosis. Electronically Signed   By: Kerby Moors M.D.   On: 11/20/2015 15:34   Dg Ribs Unilateral W/chest Right  Result Date: 10/30/2015 CLINICAL DATA:  Status post fall yesterday with a right chest injury. Pain. EXAM: RIGHT RIBS AND CHEST - 3+ VIEW COMPARISON:  PA and lateral chest 10/16/2015. Single view of the chest 09/08/2006. FINDINGS: The lungs are clear. No pneumothorax or pleural effusion. Heart size is upper normal. Aortic atherosclerosis is noted. Remote healed right sixth and seventh rib fractures are unchanged. No acute fracture is identified. IMPRESSION: No acute abnormality. Remote healed right sixth and seventh rib fractures. Atherosclerosis. Electronically Signed   By: Inge Rise M.D.   On: 10/30/2015 16:35   Ct Head Wo Contrast  Result Date: 10/30/2015 CLINICAL DATA:  Confusion EXAM: CT HEAD WITHOUT CONTRAST TECHNIQUE:  Contiguous axial images were obtained from the base of the skull through the vertex without intravenous contrast. COMPARISON:  10/16/2015 FINDINGS: No skull fracture is noted. Paranasal sinuses and mastoid air cells are unremarkable. No intracranial hemorrhage, mass effect or midline shift. Stable atrophy.  Ventricular size is stable from prior exam. No acute cortical infarction. No mass lesion is noted on this unenhanced scan. 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 IMPRESSION: No acute intracranial abnormality. No definite acute cortical infarction. Stable cerebral atrophy. Again noted old infarct in left cerebellum. Electronically Signed   By: Lahoma Crocker M.D.   On: 10/30/2015 16:42   Ct Abdomen Pelvis W Contrast  Result Date: 11/18/2015 CLINICAL DATA:  Admitted for orthostatics symptoms, elevation of troponin. EXAM: CT ABDOMEN AND PELVIS WITH CONTRAST TECHNIQUE: Multidetector CT imaging of the abdomen and pelvis was performed using the standard protocol following bolus administration of intravenous contrast. CONTRAST:  173mL ISOVUE-300 IOPAMIDOL (ISOVUE-300) INJECTION 61% COMPARISON:  None. FINDINGS: Lower chest: Mild scarring/atelectasis at each lung base. Trace pleural effusions at each lung base. Hepatobiliary: Liver and gallbladder appear normal.  Pancreas: No mass, inflammatory changes, or other significant abnormality. Spleen: Within normal limits in size and appearance. Adrenals/Urinary Tract: Right kidney appears normal without mass, stone or hydronephrosis. Multiple hypodense masses are seen within the kidneys, largest located in the lower and superior poles compatible with benign cysts by CT density measurements. An additional smaller lesion exophytic to the posterior cortex of the right kidney, measuring 1 x 1 cm, is too small to definitively characterize but not definitive for benign cyst by CT density measurement. Bladder walls appear thickened circumferentially, however, this may be due to  the partial bladder decompression. Fluid stranding/inflammation noted in the anterior pelvis adjacent to the bladder, increasing my suspicion of cystitis. Stomach/Bowel: Large amount of stool within the rectosigmoid colon. Moderate amount of stool and gas throughout the remainder of the colon, suggesting constipation. Appendix is normal. No small bowel dilatation. No evidence of bowel wall inflammation. Stomach appears normal. Vascular/Lymphatic: Scattered atherosclerotic changes of the normal caliber abdominal aorta and pelvic vasculature. No enlarged lymph nodes seen. Reproductive: No mass or other significant abnormality. Other: No free fluid or abscess collection. Mild fluid stranding/ inflammation adjacent to the bladder. Musculoskeletal: Degenerative changes throughout the scoliotic thoracolumbar spine. No acute or suspicious osseous finding. IMPRESSION: 1. Large amount of stool within the rectosigmoid colon, distending the rectal vault, suggesting fecal impaction. Moderate amount of stool and gas throughout the remainder of the colon suggesting associated constipation. 2. Circumferential thickening of the bladder walls, possibly accentuated to some degree by the partial bladder decompression, but suspicious for cystitis as there is fluid stranding/inflammation about the bladder. Consider correlation with urinalysis. 3. 1 cm hyperdense mass exophytic to the posterior cortex of the right kidney. This is too small to definitively characterize. I favor hemorrhagic cyst but would consider follow-up CT or MRI at some point to ensure benignity. Additional benign-appearing cysts within the upper and lower poles of the left kidney. 4. Small bilateral pleural effusions. 5. Aortic atherosclerosis. Electronically Signed   By: Franki Cabot M.D.   On: 11/18/2015 20:14   Dg Chest Portable 1 View  Result Date: 11/17/2015 CLINICAL DATA:  Hypotension.  Sudden onset dizziness today. EXAM: PORTABLE CHEST 1 VIEW COMPARISON:   Most recent chest radiographs 11/02/2015, additional priors. FINDINGS: Improved bibasilar and perihilar aeration from prior exam. Question of residual retrocardiac opacity is suboptimally assessed by portable technique. There stable cardiomegaly and atherosclerotic calcification of the aortic Virl. No large pleural effusion or pneumothorax. Remote right rib fractures. IMPRESSION: Improved perihilar and bibasilar aeration from prior exam, improved pneumonia or pulmonary edema. Question residual opacity at the left lung base suboptimally assessed by portable technique. Stable cardiomegaly and aortic atherosclerosis. Electronically Signed   By: Jeb Levering M.D.   On: 11/17/2015 19:48   Dg Chest Port 1 View  Result Date: 11/02/2015 CLINICAL DATA:  Congestive heart failure. EXAM: PORTABLE CHEST 1 VIEW COMPARISON:  10/30/2015. FINDINGS: Stable enlarged cardiac silhouette. Interval bibasilar airspace opacity. No visible pleural fluid. Unremarkable bones. IMPRESSION: 1. Interval bibasilar pneumonia or alveolar edema. 2. Stable cardiomegaly. Electronically Signed   By: Claudie Revering M.D.   On: 11/02/2015 16:54   Dg Abd Portable 2 Views  Result Date: 11/17/2015 CLINICAL DATA:  Constipation.  Abdominal pain. EXAM: PORTABLE ABDOMEN - 2 VIEW COMPARISON:  None. FINDINGS: Lung bases are within normal limits. No free air, portal venous gas, or pneumatosis. Mild fecal loading in the colon. No bowel obstruction. Scoliotic and degenerative changes seen in the spine. No other acute abnormalities. IMPRESSION: Mild  fecal loading in the colon.  No other acute abnormalities. Electronically Signed   By: Dorise Bullion III M.D   On: 11/17/2015 14:39   Dg Hip Unilat With Pelvis 2-3 Views Right  Result Date: 10/31/2015 CLINICAL DATA:  Pain following fall EXAM: DG HIP (WITH OR WITHOUT PELVIS) 2-3V RIGHT COMPARISON:  None. FINDINGS: Frontal pelvis as well as frontal and lateral right hip images were obtained. Bones are  osteoporotic. No acute fracture or dislocation is apparent. There is mild symmetric narrowing of both hip joints. There is calcification adjacent to the greater trochanter on the right, likely due to calcific tendinosis. There is levorotoscoliosis in the lower lumbar region with degenerative type change in this area. There are focal areas of vascular calcification. IMPRESSION: Osteoporosis. Osteoarthritic change in the lower lumbar spine and both hip regions. Probable calcific tendinosis lateral right proximal femur region adjacent to the greater trochanter. No acute fracture or dislocation. There is what is felt to represent a Mach band in the proximal right femur, seen to extend beyond the confines of the bone. Atherosclerotic calcification noted at several sites. Electronically Signed   By: Lowella Grip III M.D.   On: 10/31/2015 15:39       Subjective: Patient is feeling better today. No shortness of breath or cough  Discharge Exam: Vitals:   11/26/15 1500 11/27/15 0551  BP: (!) 142/80 128/70  Pulse: 98 95  Resp: 15 16  Temp: 98.3 F (36.8 C) 98.5 F (36.9 C)   Vitals:   11/25/15 2202 11/26/15 0650 11/26/15 1500 11/27/15 0551  BP: 137/83 (!) 151/96 (!) 142/80 128/70  Pulse: 90 (!) 114 98 95  Resp: 16 16 15 16   Temp: 97.8 F (36.6 C) 97.7 F (36.5 C) 98.3 F (36.8 C) 98.5 F (36.9 C)  TempSrc: Oral Oral Oral Oral  SpO2: 97% 97% 97% 96%  Weight:      Height:        General: Pt is alert, awake, not in acute distress Cardiovascular: irregular, S1/S2 +, no rubs, no gallops Respiratory: bilateral rhonchi, no wheezing, no rhonchi Abdominal: Soft, NT, ND, bowel sounds + Extremities: no edema, no cyanosis    The results of significant diagnostics from this hospitalization (including imaging, microbiology, ancillary and laboratory) are listed below for reference.     Microbiology: No results found for this or any previous visit (from the past 240 hour(s)).   Labs: BNP  (last 3 results)  Recent Labs  11/02/15 0447  BNP [id number]*   Basic Metabolic Panel:  Recent Labs Lab 11/23/15 0558 11/24/15 0602 11/25/15 0617 11/26/15 0606 11/27/15 0541  NA 141 140 138 141 143  K 3.2* 2.9* 3.3* 3.4* 3.6  CL 101 101 101 105 106  CO2 31 32 29 31 30   GLUCOSE 96 100* 99 102* 93  BUN 16 14 13 16 15   CREATININE 0.79 0.88 0.86 0.78 0.77  CALCIUM 8.6* 8.7* 8.7* 8.8* 9.0   Liver Function Tests: No results for input(s): AST, ALT, ALKPHOS, BILITOT, PROT, ALBUMIN in the last 168 hours. No results for input(s): LIPASE, AMYLASE in the last 168 hours. No results for input(s): AMMONIA in the last 168 hours. CBC: No results for input(s): WBC, NEUTROABS, HGB, HCT, MCV, PLT in the last 168 hours. Cardiac Enzymes: No results for input(s): CKTOTAL, CKMB, CKMBINDEX, TROPONINI in the last 168 hours. BNP: Invalid input(s): POCBNP CBG: No results for input(s): GLUCAP in the last 168 hours. D-Dimer No results for input(s): DDIMER in the last  72 hours. Hgb A1c No results for input(s): HGBA1C in the last 72 hours. Lipid Profile No results for input(s): CHOL, HDL, LDLCALC, TRIG, CHOLHDL, LDLDIRECT in the last 72 hours. Thyroid function studies No results for input(s): TSH, T4TOTAL, T3FREE, THYROIDAB in the last 72 hours.  Invalid input(s): FREET3 Anemia work up No results for input(s): VITAMINB12, FOLATE, FERRITIN, TIBC, IRON, RETICCTPCT in the last 72 hours. Urinalysis    Component Value Date/Time   COLORURINE YELLOW 11/17/2015 [place] 11/17/2015 1355   LABSPEC 1.015 11/17/2015 1355   PHURINE 6.0 11/17/2015 1355   GLUCOSEU NEGATIVE 11/17/2015 1355   HGBUR NEGATIVE 11/17/2015 1355   BILIRUBINUR NEGATIVE 11/17/2015 1355   KETONESUR NEGATIVE 11/17/2015 1355   PROTEINUR 30 (A) 11/17/2015 1355   NITRITE NEGATIVE 11/17/2015 1355   LEUKOCYTESUR NEGATIVE 11/17/2015 1355   Sepsis Labs Invalid input(s): PROCALCITONIN,  WBC,  LACTICIDVEN Microbiology No  results found for this or any previous visit (from the past 240 hour(s)).   Time coordinating discharge: Over 30 minutes  SIGNED:   Kathie Dike, MD  [facility] 11/27/2015, 11:26 AM Pager [phone]  If 7PM-7AM, please contact night-coverage [email or web address] Password [email or web address]

## 2015-11-27 NOTE — Care Management Note (Signed)
Case Management Note  Patient Details  Name: Zachary Arellano MRN: [id number] Date of Birth: 21-Oct-1931  Expected Discharge Date:    11/27/2015              Expected Discharge Plan:  [place]  In-House Referral:  Clinical Social Work  Discharge planning Services  CM Consult  Post Acute Care Choice:    Choice offered to:     DME Arranged:    DME Agency:     HH Arranged:    [place] Agency:     Status of Service:  Completed, signed off  If discussed at [employer], dates discussed:    Additional Comments: Pt discharging to SNF today. CSW has made arrangements for placement. [facility] has been updated on DC plan. No CM needs.   Sherald Barge, RN 11/27/2015, 11:34 AM

## 2015-11-27 NOTE — Progress Notes (Signed)
Report called to [facility]. IV cath removed and intact. No c/o pain at this time or at the site. Verbalizes understanding. Awaiting to transport to [facility].

## 2015-11-28 ENCOUNTER — Encounter: Payer: Self-pay | Admitting: Internal Medicine

## 2015-11-28 ENCOUNTER — Non-Acute Institutional Stay (SKILLED_NURSING_FACILITY): Payer: Medicare Other | Admitting: Internal Medicine

## 2015-11-28 DIAGNOSIS — K5900 Constipation, unspecified: Secondary | ICD-10-CM | POA: Diagnosis not present

## 2015-11-28 DIAGNOSIS — R1310 Dysphagia, unspecified: Secondary | ICD-10-CM

## 2015-11-28 DIAGNOSIS — I482 Chronic atrial fibrillation, unspecified: Secondary | ICD-10-CM

## 2015-11-28 DIAGNOSIS — F0150 Vascular dementia without behavioral disturbance: Secondary | ICD-10-CM | POA: Diagnosis not present

## 2015-11-28 DIAGNOSIS — J690 Pneumonitis due to inhalation of food and vomit: Secondary | ICD-10-CM | POA: Diagnosis not present

## 2015-11-28 DIAGNOSIS — R531 Weakness: Secondary | ICD-10-CM | POA: Diagnosis not present

## 2015-11-28 NOTE — Progress Notes (Signed)
Provider:   Location:   [facility] Room Number: 124/P Place of Service:  SNF (31)  PCP: [name], MD Patient Care Team: Celene Squibb, MD as PCP - General (Internal Medicine)  Extended Emergency Contact Information Primary Emergency Contact: Maeola Harman, [place] Phone: [phone] Relation: Son Secondary Emergency Contact: [place] Address: [address]          [place], [place] [address] [place] Phone: [phone] Relation: Spouse  Code Status: Full Code Goals of Care: Advanced Directive information Advanced Directives 11/28/2015  Does patient have an advance directive? Yes  Type of Advance Directive (No Data)  Does patient want to make changes to advanced directive? No - Patient declined  Copy of advanced directive(s) in chart? Yes  Would patient like information on creating an advanced directive? -      Chief Complaint  Patient presents with  . New Admit To SNF    HPI: Patient is a 82 y.o. male seen today for admission to SNF for rehab. Most of the history was obtained fromre discharge notes and patient and his family. Patient was having multiple falls at home. He was found to have orthostatic hypotension. During his stay patient also developed rapid Atrial Fibrillation. He also had aspiration pneumonia due to dysphagia. Patient was also severely constipated and needs follow up with Gastroenterologist. Patient is not having any dizziness anymore. He still feels weak to get out of bed or go to bathroom. Has been evaluated by speech therapy and needs Thickening of food. Patients family was there with him and are concerned that he is more confused now. And dont think he will be ready to go home.    Past Medical History:  Diagnosis Date  . Chronic atrial fibrillation (HCC)    2D echo  09/23/2006 LA and right atrium severely dilated, moderate pulmonary hypertension  .  Glaucoma   . Hypertension   . Rib fracture   . Stroke (cerebrum) (HCC)    Left cerebellum.   Past Surgical History:  Procedure Laterality Date  . CATARACT EXTRACTION    . skin cancer removal      reports that he has quit smoking. He has quit using smokeless tobacco. He reports that he drinks about 6.0 oz of alcohol per week . He reports that he does not use drugs. Social History   Social History  . Marital status: Married    Spouse name: N/A  . Number of children: 4  . Years of education: N/A   Occupational History  . Not on file.   Social History Main Topics  . Smoking status: Former Research scientist (life sciences)  . Smokeless tobacco: Former Systems developer     Comment: quit smoking in his 65's  . Alcohol use 6.0 oz/week    10 Standard drinks or equivalent per week     Comment: two beers per day  . Drug use: No  . Sexual activity: Not on file   Other Topics Concern  . Not on file   Social History Narrative  . No narrative on file    Functional Status Survey:    Family History  Problem Relation Age of Onset  . Cancer Mother   . Heart disease Father   . Emphysema Father   . Heart disease Brother     Health Maintenance  Topic Date Due  . ZOSTAVAX  05/16/1993  . PNA vac Low  Risk Adult (1 of 2 - PCV13) 05/16/1998  . INFLUENZA VACCINE  03/15/2016 (Originally 11/14/2015)  . TETANUS/TDAP  09/07/2025    No Known Allergies    Medication List       Accurate as of 11/28/15 10:47 AM. Always use your most recent med list.          B-complex with vitamin C tablet Take 1 tablet by mouth daily.   bimatoprost 0.03 % ophthalmic solution Commonly known as:  LUMIGAN Place 1 drop into both eyes daily.   carvedilol 25 MG tablet Commonly known as:  COREG Take 1 tablet (25 mg total) by mouth 2 (two) times daily with a meal.   COMBIGAN 0.2-0.5 % ophthalmic solution Generic drug:  brimonidine-timolol Place 1 drop into both eyes 2 (two) times daily.   diltiazem 60 MG tablet Commonly known as:   CARDIZEM Take 1 tablet (60 mg total) by mouth every 6 (six) hours.   docusate sodium 100 MG capsule Commonly known as:  COLACE Take 1 capsule (100 mg total) by mouth daily.   multivitamin with minerals Tabs tablet Take 1 tablet by mouth daily.   pilocarpine 2 % ophthalmic solution Commonly known as:  PILOCAR Place 1 drop into both eyes 4 (four) times daily.   polyethylene glycol packet Commonly known as:  MIRALAX / GLYCOLAX Take 17 g by mouth daily.   SYSTANE 0.4-0.3 % Soln Generic drug:  Polyethyl Glycol-Propyl Glycol Apply 2 drops to eye 2 (two) times daily.   VISION FORMULA EYE HEALTH Caps Take 1 capsule by mouth daily.   XARELTO 20 MG Tabs tablet Generic drug:  rivaroxaban TAKE ONE (1) TABLET EACH DAY       Review of Systems  Constitutional: Positive for fatigue and unexpected weight change. Negative for diaphoresis and fever.  HENT: Negative.   Respiratory: Negative for cough, choking, chest tightness, shortness of breath and wheezing.   Cardiovascular: Negative for chest pain and leg swelling.  Gastrointestinal: Positive for constipation. Negative for abdominal distention, abdominal pain, diarrhea and nausea.  Neurological: Negative for dizziness and light-headedness.  Psychiatric/Behavioral: Positive for confusion.    Vitals:   11/28/15 1045  BP: 134/76  Pulse: 74  Resp: 18  Temp: 97.8 F (36.6 C)  TempSrc: Oral   There is no height or weight on file to calculate BMI. Physical Exam  HENT:  Head: Normocephalic.  Neck: Neck supple.  Cardiovascular: Normal heart sounds.   Pulmonary/Chest: Breath sounds normal. No respiratory distress. He has no wheezes. He has no rales.  Decrease breadth sounds B/L  Abdominal: Bowel sounds are normal. He exhibits no distension. There is no tenderness.  Musculoskeletal: He exhibits no edema.  Neurological: He is alert. He has normal strength.  Patient was not oriented to time. And was little confuse about  place. Strength was 3/5 in all extremities.    Labs reviewed: Basic Metabolic Panel:  Recent Labs  10/30/15 1405 10/30/15 1939  11/25/15 0617 11/26/15 0606 11/27/15 0541  NA 121*  --   < > 138 141 143  K 2.6*  --   < > 3.3* 3.4* 3.6  CL 89*  --   < > 101 105 106  CO2 22  --   < > 29 31 30   GLUCOSE 119*  --   < > 99 102* 93  BUN 23*  --   < > 13 16 15   CREATININE 1.15  --   < > 0.86 0.78 0.77  CALCIUM 9.1  --   < >  8.7* 8.8* 9.0  MG 2.2 2.0  --   --   --   --   < > = values in this interval not displayed. Liver Function Tests:  Recent Labs  10/31/15 0202 11/17/15 1435  AST 18 20  ALT 14* 26  ALKPHOS 36* 59  BILITOT 1.6* 1.2  PROT 5.5* 6.1*  ALBUMIN 3.6 3.6   No results for input(s): LIPASE, AMYLASE in the last 8760 hours. No results for input(s): AMMONIA in the last 8760 hours. CBC:  Recent Labs  10/30/15 1405 10/31/15 0202 11/17/15 1435 11/18/15 0655  WBC 12.9* 6.9 7.9 15.3*  NEUTROABS 10.3*  --  5.4  --   HGB 13.6 12.4* 12.8* 14.2  HCT 37.4* 33.3* 37.8* 41.7  MCV 91.7 91.2 97.9 97.7  PLT 198 180 231 271   Cardiac Enzymes:  Recent Labs  11/17/15 2110 11/18/15 0242 11/18/15 1117  TROPONINI 0.04* 0.03* 0.03*   BNP: Invalid input(s): POCBNP No results found for: HGBA1C Lab Results  Component Value Date   TSH 1.803 11/18/2015   Lab Results  Component Value Date   VITAMINB12 1,760 (H) 11/18/2015   No results found for: FOLATE No results found for: IRON, TIBC, FERRITIN  Imaging and Procedures obtained prior to SNF admission: Ct Abdomen Pelvis W Contrast  Result Date: 11/18/2015 CLINICAL DATA:  Admitted for orthostatics symptoms, elevation of troponin. EXAM: CT ABDOMEN AND PELVIS WITH CONTRAST TECHNIQUE: Multidetector CT imaging of the abdomen and pelvis was performed using the standard protocol following bolus administration of intravenous contrast. CONTRAST:  123mL ISOVUE-300 IOPAMIDOL (ISOVUE-300) INJECTION 61% COMPARISON:  None. FINDINGS:  Lower chest: Mild scarring/atelectasis at each lung base. Trace pleural effusions at each lung base. Hepatobiliary: Liver and gallbladder appear normal. Pancreas: No mass, inflammatory changes, or other significant abnormality. Spleen: Within normal limits in size and appearance. Adrenals/Urinary Tract: Right kidney appears normal without mass, stone or hydronephrosis. Multiple hypodense masses are seen within the kidneys, largest located in the lower and superior poles compatible with benign cysts by CT density measurements. An additional smaller lesion exophytic to the posterior cortex of the right kidney, measuring 1 x 1 cm, is too small to definitively characterize but not definitive for benign cyst by CT density measurement. Bladder walls appear thickened circumferentially, however, this may be due to the partial bladder decompression. Fluid stranding/inflammation noted in the anterior pelvis adjacent to the bladder, increasing my suspicion of cystitis. Stomach/Bowel: Large amount of stool within the rectosigmoid colon. Moderate amount of stool and gas throughout the remainder of the colon, suggesting constipation. Appendix is normal. No small bowel dilatation. No evidence of bowel wall inflammation. Stomach appears normal. Vascular/Lymphatic: Scattered atherosclerotic changes of the normal caliber abdominal aorta and pelvic vasculature. No enlarged lymph nodes seen. Reproductive: No mass or other significant abnormality. Other: No free fluid or abscess collection. Mild fluid stranding/ inflammation adjacent to the bladder. Musculoskeletal: Degenerative changes throughout the scoliotic thoracolumbar spine. No acute or suspicious osseous finding. IMPRESSION: 1. Large amount of stool within the rectosigmoid colon, distending the rectal vault, suggesting fecal impaction. Moderate amount of stool and gas throughout the remainder of the colon suggesting associated constipation. 2. Circumferential thickening of the  bladder walls, possibly accentuated to some degree by the partial bladder decompression, but suspicious for cystitis as there is fluid stranding/inflammation about the bladder. Consider correlation with urinalysis. 3. 1 cm hyperdense mass exophytic to the posterior cortex of the right kidney. This is too small to definitively characterize. I favor hemorrhagic cyst  but would consider follow-up CT or MRI at some point to ensure benignity. Additional benign-appearing cysts within the upper and lower poles of the left kidney. 4. Small bilateral pleural effusions. 5. Aortic atherosclerosis. Electronically Signed   By: Franki Cabot M.D.   On: 11/18/2015 20:14   Dg Chest Portable 1 View  Result Date: 11/17/2015 CLINICAL DATA:  Hypotension.  Sudden onset dizziness today. EXAM: PORTABLE CHEST 1 VIEW COMPARISON:  Most recent chest radiographs 11/02/2015, additional priors. FINDINGS: Improved bibasilar and perihilar aeration from prior exam. Question of residual retrocardiac opacity is suboptimally assessed by portable technique. There stable cardiomegaly and atherosclerotic calcification of the aortic [place]. No large pleural effusion or pneumothorax. Remote right rib fractures. IMPRESSION: Improved perihilar and bibasilar aeration from prior exam, improved pneumonia or pulmonary edema. Question residual opacity at the left lung base suboptimally assessed by portable technique. Stable cardiomegaly and aortic atherosclerosis. Electronically Signed   By: Jeb Levering M.D.   On: 11/17/2015 19:48   Dg Abd Portable 2 Views  Result Date: 11/17/2015 CLINICAL DATA:  Constipation.  Abdominal pain. EXAM: PORTABLE ABDOMEN - 2 VIEW COMPARISON:  None. FINDINGS: Lung bases are within normal limits. No free air, portal venous gas, or pneumatosis. Mild fecal loading in the colon. No bowel obstruction. Scoliotic and degenerative changes seen in the spine. No other acute abnormalities. IMPRESSION: Mild fecal loading in the colon.  No  other acute abnormalities. Electronically Signed   By: Dorise Bullion III M.D   On: 11/17/2015 14:39    Assessment/Plan  1. Dizziness   Patient dizziness has resolved since his BP meds were discontinued. Patient still has significant weakness. Will continue Therapy. He continues to be fall risk. Was discussed with nurse and patient to not get out of bed without assistance.  2 Chronic Atrial fibrillation  Rate well controlled. Is on short acting Cardizem . Will d/w pharmacy to change to single dose. Also continue Xarelto.   3. Dysphagia  Swallowing evaluation.  4. Dementai. Patient seems to have cognitive dysfunction worsening for past few months. Will have evaluated and see if needs further work up.  5. Kidney Cyst on CT scan. Will need MRI  6. Constipation Resolved but with his history of weight loss will have follow up with GI.

## 2015-11-29 ENCOUNTER — Non-Acute Institutional Stay (SKILLED_NURSING_FACILITY): Payer: Medicare Other | Admitting: Internal Medicine

## 2015-11-29 DIAGNOSIS — I482 Chronic atrial fibrillation, unspecified: Secondary | ICD-10-CM

## 2015-11-29 DIAGNOSIS — Z7901 Long term (current) use of anticoagulants: Secondary | ICD-10-CM

## 2015-11-29 NOTE — Progress Notes (Signed)
This is an acute visit.  Level care skilled.  Facility [facility].  Chief complaint acute visit follow-up pharmacy consult note about interaction between Xarelto and diltiazem  HPI:  Patient is a pleasant 82 year old male seen today for medication concerns per pharmacy as noted above.  Patient was recently admitted here after multiple falls at home he was found to have orthostatic hypotension during stay in the hospital he also developed rapid atrial fibrillation.  Also aspiration pneumonia due to dysphagia.  Currently is not complaining of any dizziness still has some weakness.  Still has some confusion although he appears to be pleasantly confused.  I do see a listed history of chronic atrial fibrillation he is on diltiazem he is also on Xarelto for anticoagulation.  I will see as noted in interaction between Xarelto on diltiazem apparently has the possibility of increasing the effects of suboptimal.  Patient does have mildly impaired creatinine clearance of 60 mL a minute-.  Renal function in regards BUN and creatinine appears to be pretty stable with creatinine of 0.77 BUN of 18 on recent lab and this appears to have been fairly stable in the hospital.  I did discuss this with Dr. Lyndel Safe in the facility and at this point wi monitor for bruising or bleeding since he appears to be stable and apparently has tolerated to Xarelto well as well as the diltiazem-.  Currently he is lying in bed comfortably does not complaining of any increased bruising or bleeding chest pain or palpitations he appears to be pleasantly confused is cooperative with exam  Patient was having multiple falls at home and found to have orthostatic  also had rapid atrial fibrillation in the hospital and aspiration pneumonia due to dysphagia.  This has largely improved with no additional dizziness again he still is prone to get out of bed and fall and has been advised to try to stay and uses call  light.          Past Medical History:  Diagnosis Date  . Chronic atrial fibrillation (HCC)    2D echo  09/23/2006 LA and right atrium severely dilated, moderate pulmonary hypertension  . Glaucoma   . Hypertension   . Rib fracture   . Stroke (cerebrum) (HCC)    Left cerebellum.        Past Surgical History:  Procedure Laterality Date  . CATARACT EXTRACTION    . skin cancer removal      reports that he has quit smoking. He has quit using smokeless tobacco. He reports that he drinks about 6.0 oz of alcohol per week . He reports that he does not use drugs. Social History        Social History  . Marital status: Married    Spouse name: N/A  . Number of children: 4  . Years of education: N/A      Occupational History  . Not on file.         Social History Main Topics  . Smoking status: Former Research scientist (life sciences)  . Smokeless tobacco: Former Systems developer     Comment: quit smoking in his 43's  . Alcohol use 6.0 oz/week    10 Standard drinks or equivalent per week     Comment: two beers per day  . Drug use: No  . Sexual activity: Not on file       Other Topics Concern  . Not on file      Social History Narrative  . No narrative on file  Functional Status Survey:         Family History  Problem Relation Age of Onset  . Cancer Mother   . Heart disease Father   . Emphysema Father   . Heart disease Brother         Health Maintenance  Topic Date Due  . ZOSTAVAX  05/16/1993  . PNA vac Low Risk Adult (1 of 2 - PCV13) 05/16/1998  . INFLUENZA VACCINE  03/15/2016 (Originally 11/14/2015)  . TETANUS/TDAP  09/07/2025    No Known Allergies        Medication List           Accurate as of 11/28/15 10:47 AM. Always use your most recent med list.           B-complex with vitamin C tablet Take 1 tablet by mouth daily.  bimatoprost 0.03 % ophthalmic solution Commonly known as:  LUMIGAN Place 1 drop into both eyes daily.   carvedilol 25 MG tablet Commonly known as:  COREG Take 1 tablet (25 mg total) by mouth 2 (two) times daily with a meal.  COMBIGAN 0.2-0.5 % ophthalmic solution Generic drug:  brimonidine-timolol Place 1 drop into both eyes 2 (two) times daily.  diltiazem 60 MG tablet Commonly known as:  CARDIZEM Take 1 tablet (60 mg total) by mouth every 6 (six) hours.  docusate sodium 100 MG capsule Commonly known as:  COLACE Take 1 capsule (100 mg total) by mouth daily.  multivitamin with minerals Tabs tablet Take 1 tablet by mouth daily.  pilocarpine 2 % ophthalmic solution Commonly known as:  PILOCAR Place 1 drop into both eyes 4 (four) times daily.  polyethylene glycol packet Commonly known as:  MIRALAX / GLYCOLAX Take 17 g by mouth daily.  SYSTANE 0.4-0.3 % Soln Generic drug:  Polyethyl Glycol-Propyl Glycol Apply 2 drops to eye 2 (two) times daily.  VISION FORMULA EYE HEALTH Caps Take 1 capsule by mouth daily.  XARELTO 20 MG Tabs tablet Generic drug:  rivaroxaban TAKE ONE (1) TABLET EACH DAY      Review of Systems  Constitutional: Positive for fatigue and unexpected weight change. Negative for diaphoresis and fever.  HENT: Negative.   Respiratory: Negative for cough, choking, chest tightness, shortness of breath and wheezing.   Cardiovascular: Negative for chest pain and leg swelling.  Gastrointestinal: Positive for constipation. Negative for abdominal distention, abdominal pain, diarrhea and nausea.  Neurological: Negative for dizziness and light-headedness.  Psychiatric/Behavioral: Positive for confusion.      Marland Kitchen Physical Exam   He is afebrile pulses 76 respirations of 16  Gen. pleasant elderly male in no distress resting comfortably in bed I do not see increased bruising or bleeding HENT:  Head: Normocephalic.  Neck: Neck supple.  Cardiovascular: Regular irregular rate and rhythm I do not appreciate significant edema Pulmonary/Chest: Breath sounds normal. No  respiratory distress. He has no wheezes. He has no rales.  Decrease breadth sounds B/L  Abdominal: Bowel sounds are normal. He exhibits no distension. There is no tenderness.  Musculoskeletal: He exhibits no edema.  Neurological: He is alert. He has normal strength.  Patient was not oriented to time. And was little confuse about place was able to tell me he was a traveling Data processing manager from [place] I told him I was originally from [place] he stated he used to work for a fellow from [place] and appeared to give a fairly accurate history . Marland Kitchen    Labs reviewed: Basic Metabolic Panel:  Recent Labs (within last  365 days)   Recent Labs  10/30/15 1405 10/30/15 1939  11/25/15 0617 11/26/15 0606 11/27/15 0541  NA 121*  --   < > 138 141 143  K 2.6*  --   < > 3.3* 3.4* 3.6  CL 89*  --   < > 101 105 106  CO2 22  --   < > 29 31 30   GLUCOSE 119*  --   < > 99 102* 93  BUN 23*  --   < > 13 16 15   CREATININE 1.15  --   < > 0.86 0.78 0.77  CALCIUM 9.1  --   < > 8.7* 8.8* 9.0  MG 2.2 2.0  --   --   --   --   < > = values in this interval not displayed.   Liver Function Tests:  Recent Labs (within last 365 days)   Recent Labs  10/31/15 0202 11/17/15 1435  AST 18 20  ALT 14* 26  ALKPHOS 36* 59  BILITOT 1.6* 1.2  PROT 5.5* 6.1*  ALBUMIN 3.6 3.6     Recent Labs (within last 365 days)  No results for input(s): LIPASE, AMYLASE in the last 8760 hours.   Recent Labs (within last 365 days)  No results for input(s): AMMONIA in the last 8760 hours.   CBC:  Recent Labs (within last 365 days)   Recent Labs  10/30/15 1405 10/31/15 0202 11/17/15 1435 11/18/15 0655  WBC 12.9* 6.9 7.9 15.3*  NEUTROABS 10.3*  --  5.4  --   HGB 13.6 12.4* 12.8* 14.2  HCT 37.4* 33.3* 37.8* 41.7  MCV 91.7 91.2 97.9 97.7  PLT 198 180 231 271     Cardiac Enzymes:  Recent Labs (within last 365 days)   Recent Labs  11/17/15 2110 11/18/15 0242 11/18/15 1117  TROPONINI 0.04*  0.03* 0.03*     BNP: Recent Labs (within last 365 days)  Invalid input(s): POCBNP   Recent Labs  No results found for: HGBA1C   Recent Labs       Lab Results  Component Value Date   TSH 1.803 11/18/2015     Recent Labs       Lab Results  Component Value Date   VITAMINB12 1,760 (H) 11/18/2015     Recent Labs  No results found for: FOLATE   Recent Labs  No results found for: IRON, TIBC, FERRITIN    Imaging and Procedures obtained prior to SNF admission: Ct Abdomen Pelvis W Contrast  Result Date: 11/18/2015 CLINICAL DATA:  Admitted for orthostatics symptoms, elevation of troponin. EXAM: CT ABDOMEN AND PELVIS WITH CONTRAST TECHNIQUE: Multidetector CT imaging of the abdomen and pelvis was performed using the standard protocol following bolus administration of intravenous contrast. CONTRAST:  142mL ISOVUE-300 IOPAMIDOL (ISOVUE-300) INJECTION 61% COMPARISON:  None. FINDINGS: Lower chest: Mild scarring/atelectasis at each lung base. Trace pleural effusions at each lung base. Hepatobiliary: Liver and gallbladder appear normal. Pancreas: No mass, inflammatory changes, or other significant abnormality. Spleen: Within normal limits in size and appearance. Adrenals/Urinary Tract: Right kidney appears normal without mass, stone or hydronephrosis. Multiple hypodense masses are seen within the kidneys, largest located in the lower and superior poles compatible with benign cysts by CT density measurements. An additional smaller lesion exophytic to the posterior cortex of the right kidney, measuring 1 x 1 cm, is too small to definitively characterize but not definitive for benign cyst by CT density measurement. Bladder walls appear thickened circumferentially, however, this may be due  to the partial bladder decompression. Fluid stranding/inflammation noted in the anterior pelvis adjacent to the bladder, increasing my suspicion of cystitis. Stomach/Bowel: Large amount of stool within the  rectosigmoid colon. Moderate amount of stool and gas throughout the remainder of the colon, suggesting constipation. Appendix is normal. No small bowel dilatation. No evidence of bowel wall inflammation. Stomach appears normal. Vascular/Lymphatic: Scattered atherosclerotic changes of the normal caliber abdominal aorta and pelvic vasculature. No enlarged lymph nodes seen. Reproductive: No mass or other significant abnormality. Other: No free fluid or abscess collection. Mild fluid stranding/ inflammation adjacent to the bladder. Musculoskeletal: Degenerative changes throughout the scoliotic thoracolumbar spine. No acute or suspicious osseous finding. IMPRESSION: 1. Large amount of stool within the rectosigmoid colon, distending the rectal vault, suggesting fecal impaction. Moderate amount of stool and gas throughout the remainder of the colon suggesting associated constipation. 2. Circumferential thickening of the bladder walls, possibly accentuated to some degree by the partial bladder decompression, but suspicious for cystitis as there is fluid stranding/inflammation about the bladder. Consider correlation with urinalysis. 3. 1 cm hyperdense mass exophytic to the posterior cortex of the right kidney. This is too small to definitively characterize. I favor hemorrhagic cyst but would consider follow-up CT or MRI at some point to ensure benignity. Additional benign-appearing cysts within the upper and lower poles of the left kidney. 4. Small bilateral pleural effusions. 5. Aortic atherosclerosis. Electronically Signed   By: Franki Cabot M.D.   On: 11/18/2015 20:14   Dg Chest Portable 1 View  Result Date: 11/17/2015 CLINICAL DATA:  Hypotension.  Sudden onset dizziness today. EXAM: PORTABLE CHEST 1 VIEW COMPARISON:  Most recent chest radiographs 11/02/2015, additional priors. FINDINGS: Improved bibasilar and perihilar aeration from prior exam. Question of residual retrocardiac opacity is suboptimally assessed by  portable technique. There stable cardiomegaly and atherosclerotic calcification of the aortic Saurabh. No large pleural effusion or pneumothorax. Remote right rib fractures. IMPRESSION: Improved perihilar and bibasilar aeration from prior exam, improved pneumonia or pulmonary edema. Question residual opacity at the left lung base suboptimally assessed by portable technique. Stable cardiomegaly and aortic atherosclerosis. Electronically Signed   By: Jeb Levering M.D.   On: 11/17/2015 19:48   Dg Abd Portable 2 Views  Result Date: 11/17/2015 CLINICAL DATA:  Constipation.  Abdominal pain. EXAM: PORTABLE ABDOMEN - 2 VIEW COMPARISON:  None. FINDINGS: Lung bases are within normal limits. No free air, portal venous gas, or pneumatosis. Mild fecal loading in the colon. No bowel obstruction. Scoliotic and degenerative changes seen in the spine. No other acute abnormalities. IMPRESSION: Mild fecal loading in the colon.  No other acute abnormalities. Electronically Signed   By: Dorise Bullion III M.D   On: 11/17/2015 14:39    Assessment/Plan  1. Atrial fibrillation-with medication concerns as noted above.  Clinically this appears rate controlled he is on diltiazem Dr. Lyndel Safe is in the process of switching this to a longer acting form.  Continues on Xarelto for anticoagulation-appears to be tolerating this well with no increased bruising or bleeding this will be monitored.  Discussion as noted above.  [id number]

## 2015-11-30 ENCOUNTER — Encounter: Payer: Self-pay | Admitting: Internal Medicine

## 2015-11-30 ENCOUNTER — Non-Acute Institutional Stay (SKILLED_NURSING_FACILITY): Payer: Medicare Other | Admitting: Internal Medicine

## 2015-11-30 DIAGNOSIS — Z8659 Personal history of other mental and behavioral disorders: Secondary | ICD-10-CM | POA: Diagnosis not present

## 2015-11-30 DIAGNOSIS — F05 Delirium due to known physiological condition: Secondary | ICD-10-CM

## 2015-11-30 DIAGNOSIS — R410 Disorientation, unspecified: Secondary | ICD-10-CM
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
## 2015-12-04 ENCOUNTER — Other Ambulatory Visit (HOSPITAL_COMMUNITY)
Admission: RE | Admit: 2015-12-04 | Discharge: 2015-12-04 | Disposition: A | Discharge status: Home / Self Care | Payer: Medicare Other | Source: Skilled Nursing Facility | Attending: Internal Medicine | Admitting: Internal Medicine

## 2015-12-04 DIAGNOSIS — I951 Orthostatic hypotension: Secondary | ICD-10-CM | POA: Diagnosis present

## 2015-12-04 LAB — CBC
HCT: 35.5 % — ABNORMAL LOW (ref 39.0–52.0)
HEMOGLOBIN: 11.4 g/dL — AB (ref 13.0–17.0)
MCH: 32.7 pg (ref 26.0–34.0)
MCHC: 32.1 g/dL (ref 30.0–36.0)
MCV: 101.7 fL — ABNORMAL HIGH (ref 78.0–100.0)
PLATELETS: 189 10*3/uL (ref 150–400)
RBC: 3.49 MIL/uL — AB (ref 4.22–5.81)
RDW: 14.5 % (ref 11.5–15.5)
WBC: 7.2 10*3/uL (ref 4.0–10.5)

## 2015-12-04 LAB — BASIC METABOLIC PANEL
ANION GAP: 4 — AB (ref 5–15)
BUN: 16 mg/dL (ref 6–20)
CALCIUM: 8.8 mg/dL — AB (ref 8.9–10.3)
CO2: 32 mmol/L (ref 22–32)
CREATININE: 0.75 mg/dL (ref 0.61–1.24)
Chloride: 105 mmol/L (ref 101–111)
Glucose, Bld: 92 mg/dL (ref 65–99)
Potassium: 3.6 mmol/L (ref 3.5–5.1)
SODIUM: 141 mmol/L (ref 135–145)

## 2015-12-06 ENCOUNTER — Encounter: Payer: Self-pay | Admitting: Physician Assistant

## 2015-12-06 ENCOUNTER — Ambulatory Visit (INDEPENDENT_AMBULATORY_CARE_PROVIDER_SITE_OTHER): Payer: Medicare Other | Admitting: Physician Assistant

## 2015-12-06 ENCOUNTER — Ambulatory Visit: Payer: 59 | Admitting: Physician Assistant

## 2015-12-06 VITALS — BP 110/76 | HR 79 | Ht 69.0 in | Wt 148.0 lb

## 2015-12-06 DIAGNOSIS — I10 Essential (primary) hypertension: Secondary | ICD-10-CM

## 2015-12-06 DIAGNOSIS — I482 Chronic atrial fibrillation, unspecified: Secondary | ICD-10-CM

## 2015-12-06 DIAGNOSIS — I495 Sick sinus syndrome: Secondary | ICD-10-CM | POA: Diagnosis not present

## 2015-12-06 DIAGNOSIS — I5032 Chronic diastolic (congestive) heart failure: Secondary | ICD-10-CM

## 2015-12-06 NOTE — Progress Notes (Signed)
Cardiology Office Note    Date:  12/06/2015   ID:  Zachary Arellano, DOB 27-Jan-1932, MRN [id number]  PCP:  [name], MD  Cardiologist: Dr. Debara Pickett, but patient is now in the [facility] and would like to be followed here in [place]. Seen by Dr. Domenic Polite in the hospital so we'll schedule follow-up with him.  Chief Complaint  Patient presents with  . Follow-up    History of Present Illness:  Zachary Arellano is a 82 y.o. male with a history of chronic atrial fibrillation CHADsVASc=3-4 and hypertension which has been well controlled. Recently, he is on Xarelto. He had an admission to the hospital with dehydration and electrolyte abnormalities in the setting of recent use of Diamox.  Cardizem was decreased from 240 to 180, his lisniopril hctz was stopped. Atenolol was decreased to 50 mg.  And Diamox was stopped.     Acute diastolic HF in hospital resolved with 1 dose of IV lasix.  His meds were adjusted and he was discharged 11/06/15.   he saw Cecilie Kicks, NP 11/08/15. Bradycardia had improved her rate was at 77 that day on Cardizem 180 mg daily. She recommended resuming lisinopril HCTZ if his blood pressure remained elevated.   He was then readmitted to the hospital 11/17/15 with a fall secondary to orthostatic hypotension. He also had significant constipation and fecal impaction. He then had some rapid atrial fibrillation and was started on Coreg in addition to his Cardizem. He was sent home on carvedilol 25 mg twice a day and diltiazem 60 mg every 6 hours.   Patient was discharged to a [facility] for 20 days for rehabilitation.  Patient comes in today accompanied by his wife. Overall he feels well. He denies any further dizziness, presyncope or falls. He has no symptoms of slow heart rates or fast heart rates and seems to be tolerating the current medications well.     Past Medical History:  Diagnosis Date  . Chronic atrial fibrillation (HCC)    2D echo  09/23/2006 LA and right atrium  severely dilated, moderate pulmonary hypertension  . Glaucoma   . Hypertension   . Rib fracture   . Stroke (cerebrum) (HCC)    Left cerebellum.    Past Surgical History:  Procedure Laterality Date  . CATARACT EXTRACTION    . skin cancer removal      Current Medications: Outpatient Medications Prior to Visit  Medication Sig Dispense Refill  . B Complex-C (B-COMPLEX WITH VITAMIN C) tablet Take 1 tablet by mouth daily.    . bimatoprost (LUMIGAN) 0.03 % ophthalmic solution Place 1 drop into both eyes daily.    . brimonidine-timolol (COMBIGAN) 0.2-0.5 % ophthalmic solution Place 1 drop into both eyes 2 (two) times daily.    . carvedilol (COREG) 25 MG tablet Take 1 tablet (25 mg total) by mouth 2 (two) times daily with a meal.    . diltiazem (CARDIZEM) 60 MG tablet Take 1 tablet (60 mg total) by mouth every 6 (six) hours.    . docusate sodium (COLACE) 100 MG capsule Take 1 capsule (100 mg total) by mouth daily. 10 capsule 0  . Multiple Vitamin (MULTIVITAMIN WITH MINERALS) TABS tablet Take 1 tablet by mouth daily.    . Multiple Vitamins-Minerals (VISION FORMULA EYE HEALTH) CAPS Take 1 capsule by mouth daily.    . pilocarpine (PILOCAR) 2 % ophthalmic solution Place 1 drop into both eyes 4 (four) times daily.     Vladimir Faster Glycol-Propyl Glycol (  SYSTANE) 0.4-0.3 % SOLN Apply 2 drops to eye 2 (two) times daily.    . polyethylene glycol (MIRALAX / GLYCOLAX) packet Take 17 g by mouth daily. 14 each 0  . XARELTO 20 MG TABS tablet TAKE ONE (1) TABLET EACH DAY 30 tablet 9   No facility-administered medications prior to visit.      Allergies:   Review of patient's allergies indicates no known allergies.   Social History   Social History  . Marital status: Married    Spouse name: N/A  . Number of children: 4  . Years of education: N/A   Social History Main Topics  . Smoking status: Former Research scientist (life sciences)  . Smokeless tobacco: Former Systems developer     Comment: quit smoking in his 60's  . Alcohol use 6.0  oz/week    10 Standard drinks or equivalent per week     Comment: two beers per day  . Drug use: No  . Sexual activity: Not Asked   Other Topics Concern  . None   Social History Narrative  . None     Family History:  The patient's   family history includes Cancer in his mother; Emphysema in his father; Heart disease in his brother and father.   ROS:   Please see the history of present illness.    Review of Systems  Constitution: Positive for weakness and malaise/fatigue.  HENT: Negative.   Cardiovascular: Negative.   Respiratory: Negative.   Endocrine: Negative.   Hematologic/Lymphatic: Negative.   Musculoskeletal: Negative.   Gastrointestinal: Negative.   Genitourinary: Negative.   Neurological: Positive for loss of balance.       Confusion at times   All other systems reviewed and are negative.   PHYSICAL EXAM:   VS:  BP 110/76   Pulse 79   Ht 5\' 9"  (1.753 m)   Wt 148 lb (67.1 kg)   SpO2 98%   BMI 21.86 kg/m   Physical Exam  GEN: Well nourished, well developed, in no acute distress  Neck: no JVD, carotid bruits, or masses Cardiac:RRR; no murmurs, rubs, or gallops  Respiratory:  clear to auscultation bilaterally, normal work of breathing GI: soft, nontender, nondistended, + BS Ext: without cyanosis, clubbing, or edema, Good distal pulses bilaterally MS: no deformity or atrophy  Skin: warm and dry, no rash Neuro:  Alert and Oriented x 3  Psych: euthymic mood, full affect  Wt Readings from Last 3 Encounters:  12/06/15 148 lb (67.1 kg)  11/21/15 148 lb 11.2 oz (67.4 kg)  11/08/15 160 lb 12.8 oz (72.9 kg)      Studies/Labs Reviewed:   EKG:  EKG is  ordered today.  The ekg ordered today demonstrates Atrial fibrillation at 67 bpm  Recent Labs: 10/30/2015: Magnesium 2.0 11/02/2015: B Natriuretic Peptide 712.0 11/17/2015: ALT 26 11/18/2015: TSH 1.803 12/04/2015: BUN 16; Creatinine, Ser 0.75; Hemoglobin 11.4; Platelets 189; Potassium 3.6; Sodium 141   Lipid  Panel No results found for: CHOL, TRIG, HDL, CHOLHDL, VLDL, LDLCALC, LDLDIRECT  Additional studies/ records that were reviewed today include:    Echo 11/03/15  Study Conclusions   - Left ventricle: The cavity size was normal. Wall thickness was   increased in a pattern of mild LVH. Systolic function was normal.   The estimated ejection fraction was 55%. Wall motion was normal;   there were no regional wall motion abnormalities. The study is   not technically sufficient to allow evaluation of LV diastolic   function. - Aortic valve: Mildly  calcified annulus. Trileaflet. There was   mild regurgitation. Mean gradient (S): 3 mm Hg. Valve area (VTI):   2.19 cm^2. Valve area (Vmax): 1.88 cm^2. - Mitral valve: Mildly calcified leaflets . There was mild   regurgitation. - Left atrium: The atrium was severely dilated. - Right atrium: The atrium was severely dilated. Central venous   pressure (est): 15 mm Hg. - Tricuspid valve: There was mild regurgitation. - Pulmonary arteries: PA peak pressure: 47 mm Hg (S). - Pericardium, extracardiac: There was no pericardial effusion.   Impressions:   - Mild LVH with LVEF approximately 55%. Indeterminate diastolic   function in the setting of atrial fibrillation. Mildly calcified   mitral leaflets with mild mitral regurgitation. Severe left   atrial enlargement. Sclerotic aortic valve with mild aortic   regurgitation. Mild tricuspid regurgitation with PASP 47 mmHg.   Severe right atrial enlargement. Elevated estimated CVP.     ASSESSMENT:    1. Tachycardia-bradycardia ([place])   2. Chronic atrial fibrillation (HCC)   3. Essential hypertension   4. Chronic diastolic CHF (congestive heart failure) (HCC)      PLAN:  In order of problems listed above:  Tachybradycardia syndrome seems to be controlled on current medications with Coreg and diltiazem. Will need close follow-up and monitoring. Follow-up with Dr. Domenic Polite in one month  Chronic  atrial fibrillation rate controlled and on Xarelto  Hypertension well-controlled he had been orthostatic with multiple falls but this seems to be much improved since he is at the [facility] and after recent hospitalization.Labs reviewed from to take his ago and were stable.  Chronic diastolic heart failure compensated    Medication Adjustments/Labs and Tests Ordered: Current medicines are reviewed at length with the patient today.  Concerns regarding medicines are outlined above.  Medication changes, Labs and Tests ordered today are listed in the Patient Instructions below. Patient Instructions  Your physician recommends that you schedule a follow-up appointment in: 1 Month with Dr. Domenic Polite.   Your physician recommends that you continue on your current medications as directed. Please refer to the Current Medication list given to you today.  If you need a refill on your cardiac medications before your next appointment, please call your pharmacy.  Thank you for choosing [facility]!       Signed, Ermalinda Barrios, PA-C  12/06/2015 2:40 PM    [facility] [address], [place], [place]  [address] Phone: [phone]; Fax: [phone]

## 2015-12-06 NOTE — Patient Instructions (Signed)
Your physician recommends that you schedule a follow-up appointment in: 1 Month with Dr. Domenic Polite.   Your physician recommends that you continue on your current medications as directed. Please refer to the Current Medication list given to you today.  If you need a refill on your cardiac medications before your next appointment, please call your pharmacy.  Thank you for choosing [facility]!

## 2015-12-11 ENCOUNTER — Other Ambulatory Visit (HOSPITAL_COMMUNITY)
Admission: RE | Admit: 2015-12-11 | Discharge: 2015-12-11 | Disposition: A | Discharge status: Home / Self Care | Payer: Medicare Other | Source: Skilled Nursing Facility | Attending: Internal Medicine | Admitting: Internal Medicine

## 2015-12-11 DIAGNOSIS — I951 Orthostatic hypotension: Secondary | ICD-10-CM | POA: Insufficient documentation

## 2015-12-11 LAB — CBC WITH DIFFERENTIAL/PLATELET
BASOS ABS: 0 10*3/uL (ref 0.0–0.1)
BASOS PCT: 0 %
Eosinophils Absolute: 0.1 10*3/uL (ref 0.0–0.7)
Eosinophils Relative: 2 %
HEMATOCRIT: 34.8 % — AB (ref 39.0–52.0)
HEMOGLOBIN: 11.4 g/dL — AB (ref 13.0–17.0)
Lymphocytes Relative: 22 %
Lymphs Abs: 1.3 10*3/uL (ref 0.7–4.0)
MCH: 32.9 pg (ref 26.0–34.0)
MCHC: 32.8 g/dL (ref 30.0–36.0)
MCV: 100.6 fL — ABNORMAL HIGH (ref 78.0–100.0)
Monocytes Absolute: 0.7 10*3/uL (ref 0.1–1.0)
Monocytes Relative: 12 %
NEUTROS ABS: 3.7 10*3/uL (ref 1.7–7.7)
NEUTROS PCT: 64 %
Platelets: 175 10*3/uL (ref 150–400)
RBC: 3.46 MIL/uL — AB (ref 4.22–5.81)
RDW: 15 % (ref 11.5–15.5)
WBC: 5.9 10*3/uL (ref 4.0–10.5)

## 2015-12-13 ENCOUNTER — Ambulatory Visit (INDEPENDENT_AMBULATORY_CARE_PROVIDER_SITE_OTHER): Payer: 59 | Admitting: Internal Medicine

## 2015-12-14 ENCOUNTER — Ambulatory Visit (HOSPITAL_COMMUNITY)
Admit: 2015-12-14 | Discharge: 2015-12-14 | Disposition: A | Discharge status: Home / Self Care | Payer: Medicare Other | Attending: Internal Medicine | Admitting: Internal Medicine

## 2015-12-14 DIAGNOSIS — N2889 Other specified disorders of kidney and ureter: Secondary | ICD-10-CM | POA: Diagnosis not present

## 2015-12-14 DIAGNOSIS — I517 Cardiomegaly: Secondary | ICD-10-CM | POA: Diagnosis not present

## 2015-12-14 DIAGNOSIS — J90 Pleural effusion, not elsewhere classified: Secondary | ICD-10-CM | POA: Insufficient documentation

## 2015-12-14 DIAGNOSIS — N281 Cyst of kidney, acquired: Secondary | ICD-10-CM | POA: Insufficient documentation

## 2015-12-14 DIAGNOSIS — R93429 Abnormal radiologic findings on diagnostic imaging of unspecified kidney: Secondary | ICD-10-CM | POA: Insufficient documentation

## 2015-12-14 MED ORDER — GADOBENATE DIMEGLUMINE 529 MG/ML IV SOLN
13.0000 mL | Freq: Once | INTRAVENOUS | Status: AC | PRN
  Administered 2015-12-14: 13 mL via INTRAVENOUS

## 2015-12-25 ENCOUNTER — Encounter: Payer: Self-pay | Admitting: Internal Medicine

## 2015-12-25 ENCOUNTER — Non-Acute Institutional Stay (SKILLED_NURSING_FACILITY): Payer: Medicare Other | Admitting: Internal Medicine

## 2015-12-25 DIAGNOSIS — I959 Hypotension, unspecified: Secondary | ICD-10-CM | POA: Diagnosis not present

## 2015-12-25 DIAGNOSIS — I5032 Chronic diastolic (congestive) heart failure: Secondary | ICD-10-CM

## 2015-12-25 DIAGNOSIS — I10 Essential (primary) hypertension: Secondary | ICD-10-CM | POA: Diagnosis not present

## 2015-12-25 DIAGNOSIS — R609 Edema, unspecified: Secondary | ICD-10-CM

## 2015-12-25 DIAGNOSIS — I4891 Unspecified atrial fibrillation: Secondary | ICD-10-CM

## 2015-12-25 NOTE — Progress Notes (Signed)
Location:   [facility] Room Number: 124/P Place of Service:  SNF (31) Provider:  Fernand Parkins, MD  Patient Care Team: Celene Squibb, MD as PCP - General (Internal Medicine)  Extended Emergency Contact Information Primary Emergency Contact: Maeola Harman, [place] Phone: [phone] Relation: Son Secondary Emergency Contact: Mabe,[place] Address: [address]          [place], [place] [address] [place] Phone: [phone] Relation: Spouse  Code Status:  Full Code  Goals of care: Advanced Directive information Advanced Directives 11/30/2015  Does patient have an advance directive? Yes  Type of Advance Directive (No Data)  Does patient want to make changes to advanced directive? No - Patient declined  Copy of advanced directive(s) in chart? Yes  Would patient like information on creating an advanced directive? -     Chief Complaint  Patient presents with  . Medical Management of Chronic Issues    Routine Visit  . Acute Visit  Medical management of chronic medical issues including atrial fibrillation-hypertension-diastolic CHF-anemia-acute visit secondary to possible left lower extremity edema-  HPI:  Pt is a 82 y.o. male seen today for medical management of chronic diseases. As noted above.  Also indications were he was complaining of left knee pain however when I talked to him he said this was not the case but I didn't note incidentally some increased rightt lower extremity edema.  Patient appears to be stable in regards to his chronic medical issues  He does have increased confusion apparently during his hospitalization and subsequently after-and actually saw neurology earlier today for follow-up of this-question dementia.  Continues to have some confusion but is quite pleasant and has quite good recall at times.  Patient was admitted to the hospital recently with  increased falls at home- was found to have orthostatic hypotension-he also developed atrial fibrillation during this hospitalization and as well as treated for aspiration pneumonia secondary to dysphagia.  Also found to have constipation is suggestion for GI follow-up after discharge from facility.  Regards to his hypotension and blood pressure medications were discontinued except his Coreg and diltiazem which she continues on with a history of-afib  Recent blood pressures appear stable at 130/80-118/67-127/69.  Pulse rate appears to be controlled in the 70s-80s recently.  Recently seen by cardiology and thought to be doing relatively well.  He also has a history of diastolic CHF which appears to be stable he is not on a diuretic-again he does have some increased edema but this is more unilateral on the left-this will have to be monitored however.  He also has a history of anemia most recent hemoglobin 11.4 on 12/11/2015 which is slightly below his previous baseline we will update this there's been no evidence of increased bleeding however.  I did witness patient in therapy today any per be doing well-he is ambulatory although continues to be somewhat weak and appears he is making progress.         Past Medical History:  Diagnosis Date  . Chronic atrial fibrillation (HCC)    2D echo  09/23/2006 LA and right atrium severely dilated, moderate pulmonary hypertension  . Glaucoma   . Hypertension   . Rib fracture   . Stroke (cerebrum) (HCC)    Left cerebellum.   Past Surgical History:  Procedure Laterality Date  . CATARACT EXTRACTION    . skin cancer removal  No Known Allergies  Current Outpatient Prescriptions on File Prior to Visit  Medication Sig Dispense Refill  . B Complex-C (B-COMPLEX WITH VITAMIN C) tablet Take 1 tablet by mouth daily.    . bimatoprost (LUMIGAN) 0.03 % ophthalmic solution Place 1 drop into both eyes daily.    . brimonidine-timolol (COMBIGAN)  0.2-0.5 % ophthalmic solution Place 1 drop into both eyes 2 (two) times daily.    . carvedilol (COREG) 25 MG tablet Take 1 tablet (25 mg total) by mouth 2 (two) times daily with a meal.    . diltiazem (CARDIZEM) 60 MG tablet Take 1 tablet (60 mg total) by mouth every 6 (six) hours.    . docusate sodium (COLACE) 100 MG capsule Take 1 capsule (100 mg total) by mouth daily. 10 capsule 0  . Multiple Vitamin (MULTIVITAMIN WITH MINERALS) TABS tablet Take 1 tablet by mouth daily.    . Multiple Vitamins-Minerals (VISION FORMULA EYE HEALTH) CAPS Take 1 capsule by mouth daily.    . pilocarpine (PILOCAR) 2 % ophthalmic solution Place 1 drop into both eyes 4 (four) times daily.     Vladimir Faster Glycol-Propyl Glycol (SYSTANE) 0.4-0.3 % SOLN Apply 2 drops to eye 2 (two) times daily.    . polyethylene glycol (MIRALAX / GLYCOLAX) packet Take 17 g by mouth daily. 14 each 0  . XARELTO 20 MG TABS tablet TAKE ONE (1) TABLET EACH DAY 30 tablet 9   No current facility-administered medications on file prior to visit.      Review of Systems    . In general no complaints of fever or chills.  Skin does not complain of any rashes or itching.  Head ears eyes nose mouth and throat does not complaining of any sore throat or visual changes.  Respiratory is not complaining of shortness of breath or cough.  Cardiac does not complaining of chest pain does have some right leg edema as noted above.  GI is not complaining of nausea vomiting diarrhea or constipation or abdominal discomfort had complain of constipation previously.  GU is not complaining of dysuria.  Musculoskeletal is not complaining currently of joint pain appears to be gaining some strength.  Neurologic is not complaining of headache or dizziness currently. Dizziness episodes have largely resolved with discontinuation of some blood pressure medication.  Psych is not complaining of depression or anxiety  family has noted some increased confusion again  he is being evaluated by neurology             Immunization History  Administered Date(s) Administered  . Tdap 09/08/2015   Pertinent  Health Maintenance Due  Topic Date Due  . PNA vac Low Risk Adult (1 of 2 - PCV13) 05/16/1998  . INFLUENZA VACCINE  03/15/2016 (Originally 11/14/2015)   No flowsheet data found. Functional Status Survey:    Vitals:   12/25/15 1256  BP: 119/73  Pulse: 82  Resp: 20  Temp: 97.6 F (36.4 C)  TempSrc: Oral  SpO2: 96%    There is no height or weight on file to calculate BMI. Physical Exam   In general this is a pleasant elderly male in no distress.  His skin is warm and dry.  Eyes pupils appear reactive to light visual acuity appears grossly intact.  Oropharynx clear mucous membranes moist.  Chest is clear to auscultation there is no labored breathing.  Heart is regular irregular rate and rhythm without murmur gallop or rub he does have some right leg edema I would say  trace one plus versus trace on the left-pedal pulses are intact bilaterally.  Abdomen is soft nontender with positive bowel sounds.  Musculoskeletal does move all extremities 4 he is ambulatory but still appears somewhat weak I didn't note he was walking around: Synthroid therapy today.  Neurologic is grossly intact to speech is clear.  Psych continues with confusion he is oriented to self he knew the month was September and  stated knew what happened 16 years ago today-- however he had difficulty saying it was September 11 initially said it was September 9      Labs reviewed:  Recent Labs  10/30/15 1405 10/30/15 1939  11/26/15 0606 11/27/15 0541 12/04/15 0557  NA 121*  --   < > 141 143 141  K 2.6*  --   < > 3.4* 3.6 3.6  CL 89*  --   < > 105 106 105  CO2 22  --   < > 31 30 32  GLUCOSE 119*  --   < > 102* 93 92  BUN 23*  --   < > 16 15 16   CREATININE 1.15  --   < > 0.78 0.77 0.75  CALCIUM 9.1  --   < > 8.8* 9.0 8.8*  MG 2.2 2.0  --   --   --    --   < > = values in this interval not displayed.  Recent Labs  10/31/15 0202 11/17/15 1435  AST 18 20  ALT 14* 26  ALKPHOS 36* 59  BILITOT 1.6* 1.2  PROT 5.5* 6.1*  ALBUMIN 3.6 3.6    Recent Labs  10/30/15 1405  11/17/15 1435 11/18/15 0655 12/04/15 0557 12/11/15 0628  WBC 12.9*  < > 7.9 15.3* 7.2 5.9  NEUTROABS 10.3*  --  5.4  --   --  3.7  HGB 13.6  < > 12.8* 14.2 11.4* 11.4*  HCT 37.4*  < > 37.8* 41.7 35.5* 34.8*  MCV 91.7  < > 97.9 97.7 101.7* 100.6*  PLT 198  < > 231 271 189 175  < > = values in this interval not displayed. Lab Results  Component Value Date   TSH 1.803 11/18/2015   No results found for: HGBA1C No results found for: CHOL, HDL, LDLCALC, LDLDIRECT, TRIG, CHOLHDL  Significant Diagnostic Results in last 30 days:  Mr Abdomen W Wo Contrast  Result Date: 12/14/2015 CLINICAL DATA:  82 year old male with incidentally noted hyperdense mass in the posterior aspect of the left kidney which was indeterminate by CT. Further evaluation. EXAM: MRI ABDOMEN WITHOUT AND WITH CONTRAST TECHNIQUE: Multiplanar multisequence MR imaging of the abdomen was performed both before and after the administration of intravenous contrast. CONTRAST:  4mL MULTIHANCE GADOBENATE DIMEGLUMINE 529 MG/ML IV SOLN COMPARISON:  No prior abdominal MRI. CT the abdomen and pelvis 11/18/2015. FINDINGS: Lower chest: Trace right and small left pleural effusions lying dependently. Cardiomegaly with biatrial dilatation Hepatobiliary: No cystic or solid hepatic lesions. No intra or extrahepatic biliary ductal dilatation. Gallbladder is normal in appearance. Pancreas: No pancreatic mass. No pancreatic ductal dilatation. No pancreatic or peripancreatic fluid or inflammatory changes. Spleen: Unremarkable. Adrenals/Urinary Tract: There multiple lesions in the kidneys bilaterally which are T1 hypointense, T2 hyperintense, and do not enhance, compatible with simple cysts, largest of which measures 2.3 cm in the  upper pole of the left kidney. In addition, there is a small 1 cm exophytic lesion in the posterior aspect of the interpolar region of the left kidney (image 35 of series  14) which corresponds to the lesion of concern on the recent CT examination. This lesion is slightly T1 hyperintense minimally T2 hyperintense, and demonstrates equivocal enhancement (post gadolinium images are limited by considerable patient motion). This lesion is technically indeterminate, but favored to represent a mildly proteinaceous/hemorrhagic cysts. No hydroureteronephrosis in the visualized abdomen. Bilateral adrenal glands are normal in appearance. Stomach/Bowel: Visualized portions are unremarkable. Vascular/Lymphatic: Aortic atherosclerosis, without definite aneurysm in the visualized abdominal vasculature. Other: No significant volume of ascites in the visualized peritoneal cavity. Musculoskeletal: No aggressive osseous lesions are noted in the visualized portions of the skeleton. IMPRESSION: 1. Unfortunately, secondary to respiratory motion, today's examination is slightly limited. The lesion of concern in the posterior aspect of the interpolar region of the left kidney is favored to represent a proteinaceous/hemorrhagic cyst, but is considered indeterminate (Bosniak class 50F) because of the limitations of the examination. Repeat MRI of the abdomen with and without IV gadolinium is recommended in 6 months to ensure the stability of this lesion. 2. Multiple simple cysts are otherwise noted in the kidneys bilaterally, as above. 3. Small left and trace right pleural effusions lying dependently. 4. Cardiomegaly with biatrial dilatation. Electronically Signed   By: Vinnie Langton M.D.   On: 12/14/2015 09:57    Assessment and plan.  #1 atrial fibrillation he has recently been seen by cardiology note was reviewed this appears controlled currently on diltiazem and Coreg-he continues on Xarelto for anticoagulation.  #2 hypertension  as noted above this appears to have stabilized he is only on diltiazem and Coreg again for his history of A. fib.  #3 history of diastolic CHF--currently not on a diuretic however weights appear to have been stable we will need to re-weigh him tomorrow and notify provider of gain greater than 3 pounds.--Also will check a BNP  This appears to be compensated he is on a beta blocker.  #4-history of right leg edema again will obtain a venous Doppler rule out any DVT again he is on anticoagulation with Xarelto with history of atrial fibrillation.  #5 history of anemia hemoglobin at 11.4 on lab done 12/11/2015 is slightly before his baseline which at times has ranged between 12-14-will update a CBC for follow-up there's been no evidence of increased bleeding.  #6 per chart review during hospitalization I do note he did have a kidney lesion appreciated on CT scan in the hospital was suggestive her MRI follow-up this has been completed it was thought this possibly may be a protein-hemorrhagic cyst but suggested a repeat MRI in approximately 6 months to monitor any progression  #7  dizziness again this has resolved with d/c/ of his other blood pressure medicines other than Coreg and diltiazem at this point will monitor.  #8-question dementia-he is having neurology consult today in regards to this-will await their input-continues to have some confusion but appears to be functioning well with supportive care.  [id number] note greater than 40 minutes spent assessing patient-discussing his status with nursing staff- review chart-consult notes-and labs-and coordinating and formulating a plan of care for numerous diagnoses -of note greater than 50 percent  of time spent coordinating plan of care.         He is not on any  Assessment/Plan There are no diagnoses linked to this encounter.   Family/ staff Communication:

## 2015-12-26 ENCOUNTER — Encounter (HOSPITAL_COMMUNITY)
Admission: RE | Admit: 2015-12-26 | Discharge: 2015-12-26 | Disposition: A | Discharge status: Home / Self Care | Payer: Medicare Other | Source: Skilled Nursing Facility | Attending: Internal Medicine | Admitting: Internal Medicine

## 2015-12-26 DIAGNOSIS — M6281 Muscle weakness (generalized): Secondary | ICD-10-CM | POA: Insufficient documentation

## 2015-12-26 DIAGNOSIS — I951 Orthostatic hypotension: Secondary | ICD-10-CM | POA: Insufficient documentation

## 2015-12-26 DIAGNOSIS — I5032 Chronic diastolic (congestive) heart failure: Secondary | ICD-10-CM | POA: Insufficient documentation

## 2015-12-26 DIAGNOSIS — H409 Unspecified glaucoma: Secondary | ICD-10-CM | POA: Insufficient documentation

## 2015-12-26 LAB — BASIC METABOLIC PANEL
Anion gap: 5 (ref 5–15)
BUN: 17 mg/dL (ref 6–20)
CALCIUM: 9.4 mg/dL (ref 8.9–10.3)
CO2: 31 mmol/L (ref 22–32)
Chloride: 101 mmol/L (ref 101–111)
Creatinine, Ser: 0.84 mg/dL (ref 0.61–1.24)
GFR calc non Af Amer: >60 mL/min (ref >60)
Glucose, Bld: 100 mg/dL — ABNORMAL HIGH (ref 65–99)
Potassium: 4.1 mmol/L (ref 3.5–5.1)
SODIUM: 137 mmol/L (ref 135–145)

## 2015-12-26 LAB — CBC
HCT: 36 % — ABNORMAL LOW (ref 39.0–52.0)
Hemoglobin: 12.2 g/dL — ABNORMAL LOW (ref 13.0–17.0)
MCH: 33.5 pg (ref 26.0–34.0)
MCHC: 33.9 g/dL (ref 30.0–36.0)
MCV: 98.9 fL (ref 78.0–100.0)
Platelets: 189 10*3/uL (ref 150–400)
RBC: 3.64 MIL/uL — ABNORMAL LOW (ref 4.22–5.81)
RDW: 14.4 % (ref 11.5–15.5)
WBC: 6 10*3/uL (ref 4.0–10.5)

## 2016-01-02 ENCOUNTER — Ambulatory Visit (INDEPENDENT_AMBULATORY_CARE_PROVIDER_SITE_OTHER): Payer: Medicare Other | Admitting: Internal Medicine

## 2016-01-02 ENCOUNTER — Ambulatory Visit: Payer: Medicare Other | Admitting: Internal Medicine

## 2016-01-02 ENCOUNTER — Encounter: Payer: Self-pay | Admitting: Internal Medicine

## 2016-01-02 ENCOUNTER — Non-Acute Institutional Stay (SKILLED_NURSING_FACILITY): Payer: Medicare Other | Admitting: Internal Medicine

## 2016-01-02 VITALS — BP 114/60 | HR 56 | Ht 70.0 in | Wt 161.0 lb

## 2016-01-02 DIAGNOSIS — I4891 Unspecified atrial fibrillation: Secondary | ICD-10-CM | POA: Diagnosis not present

## 2016-01-02 DIAGNOSIS — F0390 Unspecified dementia without behavioral disturbance: Secondary | ICD-10-CM | POA: Diagnosis not present

## 2016-01-02 DIAGNOSIS — Q6102 Congenital multiple renal cysts: Secondary | ICD-10-CM

## 2016-01-02 DIAGNOSIS — I5031 Acute diastolic (congestive) heart failure: Secondary | ICD-10-CM

## 2016-01-02 DIAGNOSIS — I482 Chronic atrial fibrillation, unspecified: Secondary | ICD-10-CM

## 2016-01-02 DIAGNOSIS — I951 Orthostatic hypotension: Secondary | ICD-10-CM

## 2016-01-02 DIAGNOSIS — Z7901 Long term (current) use of anticoagulants: Secondary | ICD-10-CM

## 2016-01-02 DIAGNOSIS — I5032 Chronic diastolic (congestive) heart failure: Secondary | ICD-10-CM

## 2016-01-02 MED ORDER — FUROSEMIDE 40 MG PO TABS: 40.0000 mg | ORAL_TABLET | Freq: Every day | ORAL | 6 refills | Status: DC

## 2016-01-02 NOTE — Progress Notes (Signed)
Location:   [facility] Room Number: 124/P Place of Service:  SNF (31)  Provider: Veleta Miners  PCP: [name], MD Patient Care Team: Celene Squibb, MD as PCP - General (Internal Medicine)  Extended Emergency Contact Information Primary Emergency Contact: Maeola Harman, [place] Phone: [phone] Relation: Son Secondary Emergency Contact: S[place] Address: [address]          [place], [place] [address] [place] Phone: [phone] Relation: Spouse  Code Status: Full Code Goals of care:  Advanced Directive information Advanced Directives 01/02/2016  Does patient have an advance directive? Yes  Type of Advance Directive (No Data)  Does patient want to make changes to advanced directive? No - Patient declined  Copy of advanced directive(s) in chart? Yes  Would patient like information on creating an advanced directive? -     No Known Allergies  Chief Complaint  Patient presents with  . Discharge Note    HPI:  82 y.o. male  For discharge. Patient is going home to stay with his wife. Patient was admitted to the SNF for Rehab after number of falls at home. During this time his BP meds were discontinued. During his stay in facility patient has had no more episodes of dizziness. He has done very well with therapy. He is walking now by himself. He continues to have some confusion but overall his memory has  improved significantly. He was evaluated by neurology per his son and was recommended Aricept. He was also seen by his cardiologist for LE edema and was started on lasix. Patient is doing well today and not having any SOB, Cough,Chest pain. He is all ready to go home.    Past Medical History:  Diagnosis Date  . Chronic atrial fibrillation (HCC)    2D echo  09/23/2006 LA and right atrium severely dilated, moderate pulmonary hypertension  . Glaucoma   . Hypertension   . Rib fracture    . Stroke (cerebrum) (HCC)    Left cerebellum.    Past Surgical History:  Procedure Laterality Date  . CATARACT EXTRACTION    . skin cancer removal        reports that he has quit smoking. He has quit using smokeless tobacco. He reports that he drinks about 6.0 oz of alcohol per week . He reports that he does not use drugs. Social History   Social History  . Marital status: Married    Spouse name: N/A  . Number of children: 4  . Years of education: N/A   Occupational History  . Not on file.   Social History Main Topics  . Smoking status: Former Research scientist (life sciences)  . Smokeless tobacco: Former Systems developer     Comment: quit smoking in his 64's  . Alcohol use 6.0 oz/week    10 Standard drinks or equivalent per week     Comment: two beers per day  . Drug use: No  . Sexual activity: Not on file   Other Topics Concern  . Not on file   Social History Narrative  . No narrative on file   Functional Status Survey:    No Known Allergies  Pertinent  Health Maintenance Due  Topic Date Due  . PNA vac Low Risk Adult (1 of 2 - PCV13) 05/16/1998  . INFLUENZA VACCINE  03/15/2016 (Originally 11/14/2015)    Medications: Current Outpatient Prescriptions on File Prior to Visit  Medication Sig Dispense Refill  . B Complex-C (B-COMPLEX WITH VITAMIN C) tablet Take 1 tablet by mouth daily.    . bimatoprost (LUMIGAN) 0.03 % ophthalmic solution Place 1 drop into both eyes daily.    . brimonidine-timolol (COMBIGAN) 0.2-0.5 % ophthalmic solution Place 1 drop into both eyes 2 (two) times daily.    . carvedilol (COREG) 25 MG tablet Take 1 tablet (25 mg total) by mouth 2 (two) times daily with a meal.    . diltiazem (CARDIZEM) 60 MG tablet Take 1 tablet (60 mg total) by mouth every 6 (six) hours.    . docusate sodium (COLACE) 100 MG capsule Take 1 capsule (100 mg total) by mouth daily. 10 capsule 0  . Multiple Vitamin (MULTIVITAMIN WITH MINERALS) TABS tablet Take 1 tablet by mouth daily.    . Multiple  Vitamins-Minerals (VISION FORMULA EYE HEALTH) CAPS Take 1 capsule by mouth daily.    . pilocarpine (PILOCAR) 2 % ophthalmic solution Place 1 drop into both eyes 4 (four) times daily.     Vladimir Faster Glycol-Propyl Glycol (SYSTANE) 0.4-0.3 % SOLN Apply 2 drops to eye 2 (two) times daily.    . polyethylene glycol (MIRALAX / GLYCOLAX) packet Take 17 g by mouth daily. 14 each 0  . XARELTO 20 MG TABS tablet TAKE ONE (1) TABLET EACH DAY 30 tablet 9   No current facility-administered medications on file prior to visit.      Review of Systems  Respiratory: Negative.   Gastrointestinal: Negative.   Musculoskeletal: Negative.     Vitals:   01/02/16 1340  BP: 110/60  Pulse: 70  Resp: 20  Weight: 159 lb 9.6 oz (72.4 kg)   Body mass index is 22.9 kg/m. Physical Exam  Constitutional: He appears well-developed and well-nourished.  HENT:  Head: Normocephalic.  Mouth/Throat: Oropharynx is clear and moist.  Cardiovascular: Normal rate, regular rhythm and normal heart sounds.   Pulmonary/Chest: Breath sounds normal.  Abdominal: Soft. Bowel sounds are normal. He exhibits no distension. There is no tenderness. There is no rebound.  Musculoskeletal: He exhibits edema.  Neurological: He is alert.    Labs reviewed: Basic Metabolic Panel:  Recent Labs  10/30/15 1405 10/30/15 1939  11/27/15 0541 12/04/15 0557 12/26/15 0500  NA 121*  --   < > 143 141 137  K 2.6*  --   < > 3.6 3.6 4.1  CL 89*  --   < > 106 105 101  CO2 22  --   < > 30 32 31  GLUCOSE 119*  --   < > 93 92 100*  BUN 23*  --   < > 15 16 17   CREATININE 1.15  --   < > 0.77 0.75 0.84  CALCIUM 9.1  --   < > 9.0 8.8* 9.4  MG 2.2 2.0  --   --   --   --   < > = values in this interval not displayed. Liver Function Tests:  Recent Labs  10/31/15 0202 11/17/15 1435  AST 18 20  ALT 14* 26  ALKPHOS 36* 59  BILITOT 1.6* 1.2  PROT 5.5* 6.1*  ALBUMIN 3.6 3.6   No results for input(s): LIPASE, AMYLASE in the last 8760 hours. No  results for input(s): AMMONIA in the last 8760 hours. CBC:  Recent Labs  10/30/15 1405  11/17/15 1435  12/04/15 0557 12/11/15 0628 12/26/15 0500  WBC 12.9*  < > 7.9  < > 7.2 5.9 6.0  NEUTROABS 10.3*  --  5.4  --   --  3.7  --   HGB 13.6  < > 12.8*  < > 11.4* 11.4* 12.2*  HCT 37.4*  < > 37.8*  < > 35.5* 34.8* 36.0*  MCV 91.7  < > 97.9  < > 101.7* 100.6* 98.9  PLT 198  < > 231  < > 189 175 189  < > = values in this interval not displayed. Cardiac Enzymes:  Recent Labs  11/17/15 2110 11/18/15 0242 11/18/15 1117  TROPONINI 0.04* 0.03* 0.03*   BNP: Invalid input(s): POCBNP CBG:  Recent Labs  10/16/15 1324  GLUCAP 113*    Procedures and Imaging Studies During Stay: Mr Abdomen W Wo Contrast  Result Date: 12/14/2015 CLINICAL DATA:  82 year old male with incidentally noted hyperdense mass in the posterior aspect of the left kidney which was indeterminate by CT. Further evaluation. EXAM: MRI ABDOMEN WITHOUT AND WITH CONTRAST TECHNIQUE: Multiplanar multisequence MR imaging of the abdomen was performed both before and after the administration of intravenous contrast. CONTRAST:  67mL MULTIHANCE GADOBENATE DIMEGLUMINE 529 MG/ML IV SOLN COMPARISON:  No prior abdominal MRI. CT the abdomen and pelvis 11/18/2015. FINDINGS: Lower chest: Trace right and small left pleural effusions lying dependently. Cardiomegaly with biatrial dilatation Hepatobiliary: No cystic or solid hepatic lesions. No intra or extrahepatic biliary ductal dilatation. Gallbladder is normal in appearance. Pancreas: No pancreatic mass. No pancreatic ductal dilatation. No pancreatic or peripancreatic fluid or inflammatory changes. Spleen: Unremarkable. Adrenals/Urinary Tract: There multiple lesions in the kidneys bilaterally which are T1 hypointense, T2 hyperintense, and do not enhance, compatible with simple cysts, largest of which measures 2.3 cm in the upper pole of the left kidney. In addition, there is a small 1 cm exophytic  lesion in the posterior aspect of the interpolar region of the left kidney (image 35 of series 14) which corresponds to the lesion of concern on the recent CT examination. This lesion is slightly T1 hyperintense minimally T2 hyperintense, and demonstrates equivocal enhancement (post gadolinium images are limited by considerable patient motion). This lesion is technically indeterminate, but favored to represent a mildly proteinaceous/hemorrhagic cysts. No hydroureteronephrosis in the visualized abdomen. Bilateral adrenal glands are normal in appearance. Stomach/Bowel: Visualized portions are unremarkable. Vascular/Lymphatic: Aortic atherosclerosis, without definite aneurysm in the visualized abdominal vasculature. Other: No significant volume of ascites in the visualized peritoneal cavity. Musculoskeletal: No aggressive osseous lesions are noted in the visualized portions of the skeleton. IMPRESSION: 1. Unfortunately, secondary to respiratory motion, today's examination is slightly limited. The lesion of concern in the posterior aspect of the interpolar region of the left kidney is favored to represent a proteinaceous/hemorrhagic cyst, but is considered indeterminate (Bosniak class 41F) because of the limitations of the examination. Repeat MRI of the abdomen with and without IV gadolinium is recommended in 6 months to ensure the stability of this lesion. 2. Multiple simple cysts are otherwise noted in the kidneys bilaterally, as above. 3. Small left and trace right pleural effusions lying dependently. 4. Cardiomegaly with biatrial dilatation. Electronically Signed   By: Vinnie Langton M.D.   On: 12/14/2015 09:57    Assessment/Plan:   Chronic diastolic CHF (congestive heart failure)   Patient saw his cardiologist today. They have started him on 40 mg Lasix. D/W his son he will need close follow up and BMP in one week.  Orthostatic hypotension  resolved. His BP has been very stable in the facility  Atrial  fibrillation with RVR  Rate control. Continue Cardizem and Xarelto  Dementia Most  likely combination. Will benefit from Aricept. Patients family is going to follow with Neurology.   Renal Cyst  Needs follow up MRI in 6 months. He also needs follow up with his Gastroenterologist as recommended from hospital.     Patient is being discharged with the following home health services:    Patient is being discharged with the following durable medical equipment:    Patient has been advised to f/u with their PCP in 1-2 weeks to bring them up to date on their rehab stay.  Social services at facility was responsible for arranging this appointment.  Pt was provided with a 30 day supply of prescriptions for medications and refills must be obtained from their PCP.  For controlled substances, a more limited supply may be provided adequate until PCP appointment only.  Future labs/tests needed:

## 2016-01-02 NOTE — Patient Instructions (Signed)
Medication Instructions:  START Lasix 40mg  Take 1 tablet by mouth daily  Labwork: Your physician recommends that you return for lab work in: 1 WEEK BNP, BMET  Testing/Procedures: None Ordered  Follow-Up: Your physician recommends that you schedule a follow-up appointment in: 3-4 weeks with DR HILTY.   Any Other Special Instructions Will Be Listed Below (If Applicable).  NEXT APPT  DATE:_______________________________________  TIME:______________________________________AM/PM    If you need a refill on your cardiac medications before your next appointment, please call your pharmacy.

## 2016-01-02 NOTE — Progress Notes (Signed)
OFFICE NOTE  Chief Complaint:  Follow-up hospitalization  Primary Care Physician: [name], MD  HPI:  Zachary Arellano  is a 82 year old gentleman with history of chronic atrial fibrillation and hypertension which has been well controlled. Recently, he was put on Xarelto which he seems to be tolerating very well. I had also decreased his aspirin to 81 mg daily. With regards to blood pressure control, he is on several medications. Blood pressure has remained well controlled. He has managed to lose a couple of pounds and remains active and denies any chest pain, worsening shortness of breath, palpitations, presyncope or syncopal symptoms.   Zachary Arellano is doing quite well. He continues to be active. He replaced golf just a few days ago without any problems. Denies any chest pain or shortness of breath. He's had no bleeding issues on Xarelto although was concerned because his cousin had a significant intra-abdominal hemorrhage on result oh requiring transfusion. He's done well and we should continue this medication. He remains in A. fib and is permanently which puts him at higher risk of stroke. Blood pressure is well controlled.  Zachary Arellano returns today and is doing well. He is asymptomatic. His energy level is good, he remains active. He denies chest pain or dyspnea. eKG shows permanent a-fib. No bleeding problems on Xarelto.  01/02/2016  Zachary Arellano returns today for follow-up of recent hospitalization. Unfortunately he was seen by his ophthalmologist and placed on Diamox for glaucoma. He was not taking the medicine as prescribed however and became dehydrated and electrolyte imbalanced on the Diamox which led to hospitalization. It was felt that he might have some acute diastolic heart failure as well. An echocardiogram was performed which showed an EF of 55%, he has remained in persistent A. Fib. He was given a single dose of IV Lasix in the hospital but not sent out on diuretics. He required  rehabilitation due to severe weakness and has been at [facility] center. He was about to be discharged today. His son who accompanied him today noted that he's had some increased swelling and daily weight gain. Some of this was thought to be due to more appetite as he initially had significant weight loss, but it appears that he is swelling more.  PMHx:  Past Medical History:  Diagnosis Date  . Chronic atrial fibrillation (HCC)    2D echo  09/23/2006 LA and right atrium severely dilated, moderate pulmonary hypertension  . Glaucoma   . Hypertension   . Rib fracture   . Stroke (cerebrum) (HCC)    Left cerebellum.    Past Surgical History:  Procedure Laterality Date  . CATARACT EXTRACTION    . skin cancer removal      FAMHx:  Family History  Problem Relation Age of Onset  . Cancer Mother   . Heart disease Father   . Emphysema Father   . Heart disease Brother     SOCHx:   reports that he has quit smoking. He has quit using smokeless tobacco. He reports that he drinks about 6.0 oz of alcohol per week . He reports that he does not use drugs.  ALLERGIES:  No Known Allergies  ROS: Pertinent items noted in HPI and remainder of comprehensive ROS otherwise negative.  HOME MEDS: Current Outpatient Prescriptions  Medication Sig Dispense Refill  . furosemide (LASIX) 40 MG tablet Take 1 tablet (40 mg total) by mouth daily. 30 tablet 6   No current facility-administered medications for this visit.  LABS/IMAGING: No results found for this or any previous visit (from the past 48 hour(s)). No results found.  VITALS: BP 114/60   Pulse (!) 56   Ht 5\' 10"  (1.778 m)   Wt 161 lb (73 kg)   BMI 23.10 kg/m   EXAM: General appearance: alert and no distress Neck: no adenopathy, no carotid bruit, JVP 3 cm, supple, symmetrical, trachea midline and thyroid not enlarged, symmetric, no tenderness/mass/nodules Lungs: clear to auscultation bilaterally Heart: irregularly irregular, S1, S2  normal, no murmur, click, rub or gallop Abdomen: soft, non-tender; bowel sounds normal; no masses,  no organomegaly Extremities:1+ bilateral pitting edema  Pulses: 2+ and symmetric Skin: Skin color, texture, turgor normal. No rashes or lesions Neurologic: Grossly normal psych: Pleasant, normal mood  EKG: Normal sinus rhythm at 71  ASSESSMENT: 1. PAF - on Xarelto without bleeding 2. Hypertension-well controlled 3. Encounter for long-term anticoagulation monitoring. 4. Recent acute diastolic congestive heart failure, LVEF 55%  PLAN: 1.   Zachary Arellano was recently hospitalized with complications related to Diamox therapy. He ultimately had some acute diastolic congestive heart failure. Echo showed preserved LVEF 55%. He was taken off of diuretics at discharge but is steadily gained weight back and has developed some lower extremity edema. I recommend starting Lasix 40 mg daily. We'll recheck a metabolic profile and BNP next week. Plan follow-up in a few weeks.  Pixie Casino, MD, [facility] Attending Cardiologist [place] 01/02/2016, 12:39 PM

## 2016-01-09 ENCOUNTER — Encounter (HOSPITAL_COMMUNITY)
Admission: RE | Admit: 2016-01-09 | Discharge: 2016-01-09 | Disposition: A | Discharge status: Home / Self Care | Payer: Medicare Other | Source: Skilled Nursing Facility | Attending: *Deleted | Admitting: *Deleted

## 2016-01-10 ENCOUNTER — Ambulatory Visit: Payer: Medicare Other | Admitting: Cardiology

## 2016-01-26 ENCOUNTER — Other Ambulatory Visit: Payer: Self-pay

## 2016-01-26 DIAGNOSIS — I5031 Acute diastolic (congestive) heart failure: Secondary | ICD-10-CM

## 2016-01-29 ENCOUNTER — Telehealth: Payer: Self-pay | Admitting: Internal Medicine

## 2016-01-29 ENCOUNTER — Encounter: Payer: Self-pay | Admitting: Internal Medicine

## 2016-01-29 ENCOUNTER — Ambulatory Visit (INDEPENDENT_AMBULATORY_CARE_PROVIDER_SITE_OTHER): Payer: Medicare Other | Admitting: Internal Medicine

## 2016-01-29 VITALS — BP 153/97 | HR 88 | Ht 70.0 in | Wt 169.8 lb

## 2016-01-29 DIAGNOSIS — I5032 Chronic diastolic (congestive) heart failure: Secondary | ICD-10-CM | POA: Diagnosis not present

## 2016-01-29 DIAGNOSIS — I10 Essential (primary) hypertension: Secondary | ICD-10-CM | POA: Diagnosis not present

## 2016-01-29 DIAGNOSIS — I4821 Permanent atrial fibrillation: Secondary | ICD-10-CM

## 2016-01-29 DIAGNOSIS — I482 Chronic atrial fibrillation: Secondary | ICD-10-CM | POA: Diagnosis not present

## 2016-01-29 MED ORDER — FUROSEMIDE 20 MG PO TABS: 20.0000 mg | ORAL_TABLET | Freq: Every day | ORAL | 3 refills | Status: DC

## 2016-01-29 NOTE — Patient Instructions (Addendum)
Medication Instructions:  Your physician recommends that you continue on your current medications as directed. Please refer to the Current Medication list given to you today.  Labwork: Your physician recommends that you return for lab work in: Complete a BMET in 1 week  Testing/Procedures: None   Follow-Up: Your physician recommends that you schedule a follow-up appointment in: 6 weeks with Dr Debara Pickett.  Any Other Special Instructions Will Be Listed Below (If Applicable).  Schedule appt with Hypertension clinic in 2-3 wks    If you need a refill on your cardiac medications before your next appointment, please call your pharmacy.

## 2016-01-29 NOTE — Telephone Encounter (Signed)
New message     *STAT* If patient is at the pharmacy, call can be transferred to refill team.   1. Which medications need to be refilled? (please list name of each medication and dose if known) furosemide ? 40 mg or 20 mg   2. Which pharmacy/location (including street and city if local pharmacy) is medication to be sent to? [facility]    3. Do they need a 30 day or 90 day supply? 90 days supply

## 2016-01-29 NOTE — Progress Notes (Signed)
OFFICE NOTE  Chief Complaint:  Follow-up hospitalization  Primary Care Physician: [name], MD  HPI:  Zachary Arellano  is a 82 year old gentleman with history of chronic atrial fibrillation and hypertension which has been well controlled. Recently, he was put on Xarelto which he seems to be tolerating very well. I had also decreased his aspirin to 81 mg daily. With regards to blood pressure control, he is on several medications. Blood pressure has remained well controlled. He has managed to lose a couple of pounds and remains active and denies any chest pain, worsening shortness of breath, palpitations, presyncope or syncopal symptoms.   Zachary Arellano is doing quite well. He continues to be active. He replaced golf just a few days ago without any problems. Denies any chest pain or shortness of breath. He's had no bleeding issues on Xarelto although was concerned because his cousin had a significant intra-abdominal hemorrhage on result oh requiring transfusion. He's done well and we should continue this medication. He remains in A. fib and is permanently which puts him at higher risk of stroke. Blood pressure is well controlled.  Zachary Arellano returns today and is doing well. He is asymptomatic. His energy level is good, he remains active. He denies chest pain or dyspnea. eKG shows permanent a-fib. No bleeding problems on Xarelto.  01/02/2016  Zachary Arellano returns today for follow-up of recent hospitalization. Unfortunately he was seen by his ophthalmologist and placed on Diamox for glaucoma. He was not taking the medicine as prescribed however and became dehydrated and electrolyte imbalanced on the Diamox which led to hospitalization. It was felt that he might have some acute diastolic heart failure as well. An echocardiogram was performed which showed an EF of 55%, he has remained in persistent A. Fib. He was given a single dose of IV Lasix in the hospital but not sent out on diuretics. He required  rehabilitation due to severe weakness and has been at [facility] center. He was about to be discharged today. His son who accompanied him today noted that he's had some increased swelling and daily weight gain. Some of this was thought to be due to more appetite as he initially had significant weight loss, but it appears that he is swelling more.  01/29/2016  Zachary Arellano was seen back in follow-up. He has steadily gained some weight since I last saw him. We had recommended starting Lasix 40 mg daily however after seeing his primary care provided he is currently only taking a half of 20 mg pill or 10 mg daily. He notes some significant lower extremity swelling. Blood pressure is elevated today. He is accompanied today by his son and daughter-in-law who reports she's had significant problems with memory loss and had some sundowning while in the hospital.  PMHx:  Past Medical History:  Diagnosis Date  . Chronic atrial fibrillation (HCC)    2D echo  09/23/2006 LA and right atrium severely dilated, moderate pulmonary hypertension  . Glaucoma   . Hypertension   . Rib fracture   . Stroke (cerebrum) (HCC)    Left cerebellum.    Past Surgical History:  Procedure Laterality Date  . CATARACT EXTRACTION    . skin cancer removal      FAMHx:  Family History  Problem Relation Age of Onset  . Cancer Mother   . Heart disease Father   . Emphysema Father   . Heart disease Brother     SOCHx:   reports that he has quit smoking.  He has quit using smokeless tobacco. He reports that he drinks about 6.0 oz of alcohol per week . He reports that he does not use drugs.  ALLERGIES:  No Known Allergies  ROS: Pertinent items noted in HPI and remainder of comprehensive ROS otherwise negative.  HOME MEDS: Current Outpatient Prescriptions  Medication Sig Dispense Refill  . B Complex-C (B-COMPLEX WITH VITAMIN C) tablet Take 1 tablet by mouth daily.    . bimatoprost (LUMIGAN) 0.03 % ophthalmic solution Place 1  drop into both eyes daily.    . brimonidine-timolol (COMBIGAN) 0.2-0.5 % ophthalmic solution Place 1 drop into both eyes 2 (two) times daily.    . carvedilol (COREG) 25 MG tablet Take 1 tablet (25 mg total) by mouth 2 (two) times daily with a meal.    . diltiazem (CARDIZEM) 60 MG tablet Take 1 tablet (60 mg total) by mouth every 6 (six) hours.    . docusate sodium (COLACE) 100 MG capsule Take 1 capsule (100 mg total) by mouth daily. 10 capsule 0  . furosemide (LASIX) 20 MG tablet Take 1 tablet (20 mg total) by mouth daily. 90 tablet 3  . Multiple Vitamin (MULTIVITAMIN WITH MINERALS) TABS tablet Take 1 tablet by mouth daily.    . Multiple Vitamins-Minerals (VISION FORMULA EYE HEALTH) CAPS Take 1 capsule by mouth daily.    . pilocarpine (PILOCAR) 2 % ophthalmic solution Place 1 drop into both eyes 4 (four) times daily.     . polyethylene glycol (MIRALAX / GLYCOLAX) packet Take 17 g by mouth daily. 14 each 0  . XARELTO 20 MG TABS tablet TAKE ONE (1) TABLET EACH DAY 30 tablet 9   No current facility-administered medications for this visit.     LABS/IMAGING: No results found for this or any previous visit (from the past 48 hour(s)). No results found.  VITALS: BP (!) 153/97   Pulse 88   Ht 5\' 10"  (1.778 m)   Wt 169 lb 12.8 oz (77 kg)   BMI 24.36 kg/m   EXAM: General appearance: alert and no distress Neck: no adenopathy, no carotid bruit, JVP 3 cm, supple, symmetrical, trachea midline and thyroid not enlarged, symmetric, no tenderness/mass/nodules Lungs: clear to auscultation bilaterally Heart: irregularly irregular, S1, S2 normal, no murmur, click, rub or gallop Abdomen: soft, non-tender; bowel sounds normal; no masses,  no organomegaly Extremities:1+ bilateral pitting edema  Pulses: 2+ and symmetric Skin: Skin color, texture, turgor normal. No rashes or lesions Neurologic: Grossly normal psych: Pleasant, normal mood  EKG: Atrial fibrillation at 88, voltage criteria for  LVH  ASSESSMENT: 1. Persistent a-fib - on Xarelto without bleeding 2. Hypertension-not at goal today 3. Encounter for long-term anticoagulation monitoring. 4. Recent acute diastolic congestive heart failure, LVEF 55%  PLAN: 1.   Mr. Arellano continues to have some significant lower extremity swelling. I've started him on Lasix however he is only on 10 mg daily instead of his prior dose of 40 mg daily. I believe this was decreased by his primary care provider. It may been related to renal function. I like to increase Lasix back to 40 mg daily and recheck a metabolic profile and a week. Also his blood pressure is high today. I like to schedule follow-up in our hypertension clinic to see if he needs any additional blood pressure medication.  Follow-up with me in 2 months.  Pixie Casino, MD, [facility] Attending Cardiologist [place] 01/29/2016, 12:31 PM

## 2016-01-29 NOTE — Telephone Encounter (Signed)
Pt had appt today with Dr Debara Pickett Rx send into pt pharmacy

## 2016-02-02 ENCOUNTER — Other Ambulatory Visit (HOSPITAL_COMMUNITY)
Admission: RE | Admit: 2016-02-02 | Discharge: 2016-02-02 | Disposition: A | Discharge status: Home / Self Care | Payer: Medicare Other | Source: Ambulatory Visit | Attending: Internal Medicine | Admitting: Internal Medicine

## 2016-02-02 ENCOUNTER — Telehealth: Payer: Self-pay | Admitting: Internal Medicine

## 2016-02-02 ENCOUNTER — Telehealth: Payer: Self-pay | Admitting: *Deleted

## 2016-02-02 DIAGNOSIS — Z79899 Other long term (current) drug therapy: Secondary | ICD-10-CM | POA: Insufficient documentation

## 2016-02-02 LAB — BASIC METABOLIC PANEL
ANION GAP: 7 (ref 5–15)
BUN: 24 mg/dL — ABNORMAL HIGH (ref 6–20)
CALCIUM: 9.4 mg/dL (ref 8.9–10.3)
CO2: 31 mmol/L (ref 22–32)
CREATININE: 1.02 mg/dL (ref 0.61–1.24)
Chloride: 96 mmol/L — ABNORMAL LOW (ref 101–111)
GLUCOSE: 109 mg/dL — AB (ref 65–99)
Potassium: 4.4 mmol/L (ref 3.5–5.1)
Sodium: 134 mmol/L — ABNORMAL LOW (ref 135–145)

## 2016-02-02 NOTE — Telephone Encounter (Signed)
Spoke with patient and he requested lab order be sent to [place] (office on [address]) Order placed and called [facility], called and confirmed order viewable

## 2016-02-02 NOTE — Telephone Encounter (Signed)
-----   Message from Pixie Casino, MD sent at 02/02/2016  2:19 PM EDT ----- Please instruct him to decrease lasix to 20 mg daily and remain on that dose for now.  Dr. Lemmie Evens

## 2016-02-02 NOTE — Telephone Encounter (Signed)
Zachary Arellano is asking that the lab order be mailed to Zachary Arellano , because he misplaced the one from the office visit . Please call if you have any questions   Thanks

## 2016-02-02 NOTE — Telephone Encounter (Signed)
Spoke with pt, he voiced understanding to decrease his furosemide to 20 mg once daily.

## 2016-02-06 ENCOUNTER — Telehealth: Payer: Self-pay | Admitting: Internal Medicine

## 2016-02-06 NOTE — Telephone Encounter (Signed)
Returned call to [place] with [facility].She saw pt yesterday.Stated he was doing well no complaints.She wanted to let Dr.Hilty know 2 medications wrong on his list.Stated he is taking KDur 10 meq daily and Cardizem 120 mg daily.Stated she saw in early am and his B/P low 78/70 pulse 64.She advised pt to stay hydrated.Message sent to Dr.Hilty for review.

## 2016-02-06 NOTE — Telephone Encounter (Signed)
New Message  Verdis Frederickson From [facility] call to give report on pt  Verdis Frederickson states yesterday 10/23 pt bp was 98/70 pulse 64 regular, pt no complaints,  Verdis Frederickson states pt is taking potassium 10mg  daily and Cardizem 120mg . Verdis Frederickson states the dosage the pt is taking and whats in med chart do not match. Please call back to discuss

## 2016-02-06 NOTE — Telephone Encounter (Signed)
Returned call to [facility] with [facility] Dr.Hilty's advice given.

## 2016-02-06 NOTE — Telephone Encounter (Signed)
Ok .. Update our records. If there is continued medication confusion, he would benefit from an appointment with one of our pharmacists.  Dr. Lemmie Evens

## 2016-02-20 ENCOUNTER — Telehealth: Payer: Self-pay | Admitting: Pharmacist

## 2016-02-20 ENCOUNTER — Telehealth: Payer: Self-pay | Admitting: Internal Medicine

## 2016-02-20 NOTE — Telephone Encounter (Signed)
Pt does not have an appt with Dr Ocie Bob appt is not until 03-06-16.

## 2016-02-20 NOTE — Telephone Encounter (Signed)
If he can do an early morning appt at [phone] tomorrow we can see him then as that is my admin time and I can work him in. Otherwise we are unable to add him to our schedule tomorrow and unfortunately we do not have a pharmacist that travels to the [place] office at this time. Let me know if you need anything else for this patient! Thanks!

## 2016-02-20 NOTE — Telephone Encounter (Signed)
It looks like he has an appt on 02/22/16 with pharmacy clinic. Is he wanting to move this appt?

## 2016-02-20 NOTE — Telephone Encounter (Signed)
Pt wants to know if there is any way he can be seen Wednesday? If not,he wonder if he can be seen in [place]?

## 2016-02-20 NOTE — Telephone Encounter (Signed)
Pt wants to know if there any way you can work him in for tomorrow? He does not want to make 2 trips to [place].

## 2016-02-20 NOTE — Telephone Encounter (Signed)
Pt has an appointment on Wednesday with Dr Debara Pickett at 3:45.

## 2016-02-21 ENCOUNTER — Ambulatory Visit (INDEPENDENT_AMBULATORY_CARE_PROVIDER_SITE_OTHER): Payer: Medicare Other | Admitting: *Deleted

## 2016-02-21 VITALS — BP 110/78 | HR 68 | Ht 70.0 in

## 2016-02-21 DIAGNOSIS — I10 Essential (primary) hypertension: Secondary | ICD-10-CM

## 2016-02-21 NOTE — Progress Notes (Signed)
Patient states that he is feeling fine. Denies dizziness, chest pain, and pressure. Denies SOB at this time. Will forward to DOD, and Dr. Debara Pickett.

## 2016-02-22 ENCOUNTER — Ambulatory Visit: Payer: Medicare Other

## 2016-03-06 ENCOUNTER — Ambulatory Visit: Payer: Medicare Other | Admitting: Internal Medicine

## 2016-03-13 ENCOUNTER — Encounter: Payer: Self-pay | Admitting: Cardiology

## 2016-03-13 NOTE — Progress Notes (Signed)
Cardiology Office Note  Date: 03/14/2016   ID: Zachary Arellano, DOB 06/25/1931, MRN [id number]  PCP: [name], MD  Primary Cardiologist: [name], MD   Chief Complaint  Patient presents with  . Atrial Fibrillation    History of Present Illness: Zachary Arellano is an 82 y.o. male former patient of Dr. Debara Pickett, now establishing follow-up with me. I reviewed his records and updated the chart. He saw Dr. Debara Pickett recently in October.He is here today with his son and wife. Overall seems to be doing well although is having some trouble with short-term memory per discussion with son. They have not been letting him drive. He is not reporting any chest pain or palpitations. Still has significant leg edema. Dr. Debara Pickett had increased Lasix to 40 mg at the last visit, although the patient had it shortly thereafter cut back to 20 mg daily (unclear reasons).  He has history of chronic atrial fibrillation, most recently maintained on Xarelto for stroke prophylaxis. He does not report any significant bleeding problems. I reviewed his recent lab work as outlined below.  Past Medical History:  Diagnosis Date  . Chronic atrial fibrillation ([place])   . Essential hypertension   . Glaucoma   . History of stroke    Left cerebellum.  . Rib fracture     Past Surgical History:  Procedure Laterality Date  . CATARACT EXTRACTION    . Skin cancer removal      Current Outpatient Prescriptions  Medication Sig Dispense Refill  . B Complex-C (B-COMPLEX WITH VITAMIN C) tablet Take 1 tablet by mouth daily.    . bimatoprost (LUMIGAN) 0.03 % ophthalmic solution Place 1 drop into both eyes daily.    . brimonidine (ALPHAGAN) 0.15 % ophthalmic solution Place 1 drop into the left eye 2 (two) times daily.    . brimonidine-timolol (COMBIGAN) 0.2-0.5 % ophthalmic solution Place 1 drop into both eyes 2 (two) times daily.    . carvedilol (COREG) 25 MG tablet Take 1 tablet (25 mg total) by mouth 2 (two) times daily with a  meal.    . diltiazem (CARDIZEM CD) 120 MG 24 hr capsule Take 1 capsule (120 mg total) by mouth daily. 90 capsule 3  . docusate sodium (COLACE) 100 MG capsule Take 1 capsule (100 mg total) by mouth daily. 10 capsule 0  . Multiple Vitamin (MULTIVITAMIN WITH MINERALS) TABS tablet Take 1 tablet by mouth daily.    . Multiple Vitamins-Minerals (VISION FORMULA EYE HEALTH) CAPS Take 1 capsule by mouth daily.    . polyethylene glycol (MIRALAX / GLYCOLAX) packet Take 17 g by mouth daily. 14 each 0  . potassium chloride (K-DUR) 10 MEQ tablet Take 1 tablet (10 mEq total) by mouth daily. 90 tablet 3  . XARELTO 20 MG TABS tablet TAKE ONE (1) TABLET EACH DAY 30 tablet 9  . furosemide (LASIX) 40 MG tablet Take 1 tablet (40 mg total) by mouth daily. 90 tablet 3   No current facility-administered medications for this visit.    Allergies:  Patient has no known allergies.   Social History: The patient  reports that he has quit smoking. He has quit using smokeless tobacco. He reports that he drinks about 6.0 oz of alcohol per week . He reports that he does not use drugs.   ROS:  Please see the history of present illness. Otherwise, complete review of systems is positive for short-term memory loss.  All other systems are reviewed and negative.   Physical  Exam: VS:  BP (!) 148/82   Pulse 88   Ht 5\' 10"  (1.778 m)   Wt 173 lb (78.5 kg)   SpO2 97%   BMI 24.82 kg/m , BMI Body mass index is 24.82 kg/m.  Wt Readings from Last 3 Encounters:  03/14/16 173 lb (78.5 kg)  01/29/16 169 lb 12.8 oz (77 kg)  01/02/16 159 lb 9.6 oz (72.4 kg)    General: Elderly male, appears comfortable at rest. HEENT: Conjunctiva and lids normal, oropharynx clear. Neck: Supple, no elevated JVP or carotid bruits, no thyromegaly. Lungs: Clear to auscultation, nonlabored breathing at rest. Cardiac: Irregularly irregular, no S3 or significant systolic murmur, no pericardial rub. Abdomen: Soft, nontender, bowel sounds present, no  guarding or rebound. Extremities: 2+ leg edema and stasis, distal pulses 2+. Skin: Warm and dry. Musculoskeletal: No kyphosis. Neuropsychiatric: Alert and oriented x3, affect grossly appropriate.  ECG: I personally reviewed the tracing from 01/29/2016 which showed rate-controlled atrial fibrillation with increased voltage and repolarization changes.  Recent Labwork: 10/30/2015: Magnesium 2.0 11/02/2015: B Natriuretic Peptide 712.0 11/17/2015: ALT 26; AST 20 11/18/2015: TSH 1.803 12/26/2015: Hemoglobin 12.2; Platelets 189 02/02/2016: BUN 24; Creatinine, Ser 1.02; Potassium 4.4; Sodium 134   Other Studies Reviewed Today:  Echocardiogram 11/03/2015: Study Conclusions  - Left ventricle: The cavity size was normal. Wall thickness was   increased in a pattern of mild LVH. Systolic function was normal.   The estimated ejection fraction was 55%. Wall motion was normal;   there were no regional wall motion abnormalities. The study is   not technically sufficient to allow evaluation of LV diastolic   function. - Aortic valve: Mildly calcified annulus. Trileaflet. There was   mild regurgitation. Mean gradient (S): 3 mm Hg. Valve area (VTI):   2.19 cm^2. Valve area (Vmax): 1.88 cm^2. - Mitral valve: Mildly calcified leaflets . There was mild   regurgitation. - Left atrium: The atrium was severely dilated. - Right atrium: The atrium was severely dilated. Central venous   pressure (est): 15 mm Hg. - Tricuspid valve: There was mild regurgitation. - Pulmonary arteries: PA peak pressure: 47 mm Hg (S). - Pericardium, extracardiac: There was no pericardial effusion.  Impressions:  - Mild LVH with LVEF approximately 55%. Indeterminate diastolic   function in the setting of atrial fibrillation. Mildly calcified   mitral leaflets with mild mitral regurgitation. Severe left   atrial enlargement. Sclerotic aortic valve with mild aortic   regurgitation. Mild tricuspid regurgitation with PASP 47  mmHg.   Severe right atrial enlargement. Elevated estimated CVP.  Assessment and Plan:  1. Bilateral leg edema with diastolic heart failure. Per discussion today we will increase Lasix to 40 mg daily, follow-up BMET in 2 weeks. To elevate legs when he can, also discussed sodium restriction.  2. Chronic atrial fibrillation, heart rate is adequately controlled today. He is on a combination of Coreg and Cardizem CD. Also continues on Xarelto for stroke prophylaxis. No bleeding problems. Recent lab work reviewed.  3. Essential hypertension, systolic pressure in the 0000000 today.  4. History of stroke.  Current medicines were reviewed with the patient today.   Orders Placed This Encounter  Procedures  . Basic Metabolic Panel (BMET)    Disposition: Follow-up in 6 weeks.  Signed, Satira Sark, MD, [facility] 03/14/2016 9:47 AM    [facility] [place]. [address], [place], [place] [address] Phone: [phone]; Fax: [phone]

## 2016-03-14 ENCOUNTER — Encounter: Payer: Self-pay | Admitting: Cardiology

## 2016-03-14 ENCOUNTER — Ambulatory Visit (INDEPENDENT_AMBULATORY_CARE_PROVIDER_SITE_OTHER): Payer: Medicare Other | Admitting: Cardiology

## 2016-03-14 VITALS — BP 148/82 | HR 88 | Ht 70.0 in | Wt 173.0 lb

## 2016-03-14 DIAGNOSIS — I10 Essential (primary) hypertension: Secondary | ICD-10-CM

## 2016-03-14 DIAGNOSIS — I482 Chronic atrial fibrillation, unspecified: Secondary | ICD-10-CM

## 2016-03-14 DIAGNOSIS — I5033 Acute on chronic diastolic (congestive) heart failure: Secondary | ICD-10-CM

## 2016-03-14 DIAGNOSIS — Z8673 Personal history of transient ischemic attack (TIA), and cerebral infarction without residual deficits: Secondary | ICD-10-CM

## 2016-03-14 MED ORDER — FUROSEMIDE 40 MG PO TABS: 40.0000 mg | ORAL_TABLET | Freq: Every day | ORAL | 3 refills | Status: DC

## 2016-03-14 NOTE — Patient Instructions (Signed)
Your physician recommends that you schedule a follow-up appointment in: 6 weeks with Dr Domenic Polite.    INCREASE Lasix to 40 mg daily   Get lab work in 2 weeks (dec 14th -ish)    Thank you for choosing [facility] !

## 2016-03-26 LAB — BASIC METABOLIC PANEL
BUN: 20 mg/dL (ref 7–25)
CALCIUM: 9.6 mg/dL (ref 8.6–10.3)
CO2: 33 mmol/L — ABNORMAL HIGH (ref 20–31)
CREATININE: 1.08 mg/dL (ref 0.70–1.11)
Chloride: 102 mmol/L (ref 98–110)
Glucose, Bld: 93 mg/dL (ref 65–99)
Potassium: 4.3 mmol/L (ref 3.5–5.3)
Sodium: 138 mmol/L (ref 135–146)

## 2016-04-02 ENCOUNTER — Ambulatory Visit (INDEPENDENT_AMBULATORY_CARE_PROVIDER_SITE_OTHER): Payer: Medicare Other | Admitting: Internal Medicine

## 2016-04-02 ENCOUNTER — Ambulatory Visit (INDEPENDENT_AMBULATORY_CARE_PROVIDER_SITE_OTHER): Payer: 59 | Admitting: Internal Medicine

## 2016-04-19 ENCOUNTER — Ambulatory Visit: Payer: Medicare Other | Admitting: Cardiology

## 2016-04-23 NOTE — Progress Notes (Signed)
Cardiology Office Note  Date: 04/24/2016   ID: Zachary Arellano, DOB 14-Jul-1931, MRN [id number]  PCP: [name], MD  Primary Cardiologist: [name], MD   Chief Complaint  Patient presents with  . Diastolic heart failure    History of Present Illness: Zachary Arellano is an 83 y.o. male last seen in November 2017. He is here today with his wife for a follow-up visit. Since last visit he reports improvement in leg edema, Lasix was increased to 40 mg daily at that time. He does not report any palpitations or chest pain.  Follow-up lab work is outlined below, creatinine and potassium levels stable. Weight is up about 2 pounds compared to November 2017. He does not report any orthopnea or PND.  Today we reviewed his cardiac medications which are outlined below. No changes are being made today.  Past Medical History:  Diagnosis Date  . Chronic atrial fibrillation ([place])   . Essential hypertension   . Glaucoma   . History of stroke    Left cerebellum.  . Rib fracture     Current Outpatient Prescriptions  Medication Sig Dispense Refill  . B Complex-C (B-COMPLEX WITH VITAMIN C) tablet Take 1 tablet by mouth daily.    . bimatoprost (LUMIGAN) 0.03 % ophthalmic solution Place 1 drop into both eyes daily.    . brimonidine (ALPHAGAN) 0.15 % ophthalmic solution Place 1 drop into the left eye 2 (two) times daily.    . brimonidine-timolol (COMBIGAN) 0.2-0.5 % ophthalmic solution Place 1 drop into both eyes 2 (two) times daily.    . carvedilol (COREG) 25 MG tablet Take 1 tablet (25 mg total) by mouth 2 (two) times daily with a meal.    . diltiazem (CARDIZEM CD) 120 MG 24 hr capsule Take 1 capsule (120 mg total) by mouth daily. 90 capsule 3  . docusate sodium (COLACE) 100 MG capsule Take 1 capsule (100 mg total) by mouth daily. 10 capsule 0  . furosemide (LASIX) 40 MG tablet Take 1 tablet (40 mg total) by mouth daily. 90 tablet 3  . Multiple Vitamin (MULTIVITAMIN WITH MINERALS) TABS tablet  Take 1 tablet by mouth daily.    . Multiple Vitamins-Minerals (VISION FORMULA EYE HEALTH) CAPS Take 1 capsule by mouth daily.    . polyethylene glycol (MIRALAX / GLYCOLAX) packet Take 17 g by mouth daily. 14 each 0  . potassium chloride (K-DUR) 10 MEQ tablet Take 1 tablet (10 mEq total) by mouth daily. 90 tablet 3  . XARELTO 20 MG TABS tablet TAKE ONE (1) TABLET EACH DAY 30 tablet 9   No current facility-administered medications for this visit.    Allergies:  Patient has no known allergies.   Social History: The patient  reports that he has quit smoking. He has quit using smokeless tobacco. He reports that he drinks about 6.0 oz of alcohol per week . He reports that he does not use drugs.   ROS:  Please see the history of present illness. Otherwise, complete review of systems is positive for hearing loss.  All other systems are reviewed and negative.   Physical Exam: VS:  BP 122/80   Pulse 86   Ht 5\' 10"  (1.778 m)   Wt 175 lb (79.4 kg)   SpO2 98%   BMI 25.11 kg/m , BMI Body mass index is 25.11 kg/m.  Wt Readings from Last 3 Encounters:  04/24/16 175 lb (79.4 kg)  03/14/16 173 lb (78.5 kg)  01/29/16 169 lb 12.8  oz (77 kg)    General: Elderly male, appears comfortable at rest. HEENT: Conjunctiva and lids normal, oropharynx clear.Scar left side maxillary region following recent skin cancer surgery. Neck: Supple, no elevated JVP or carotid bruits, no thyromegaly. Lungs: Clear to auscultation, nonlabored breathing at rest. Cardiac: Irregularly irregular, no S3 or significant systolic murmur, no pericardial rub. Abdomen: Soft, nontender, bowel sounds present, no guarding or rebound. Extremities: 1+ leg edema and stasis, distal pulses 2+.  ECG: I personally reviewed the tracing from 01/29/2016 which showed rate-controlled atrial fibrillation with increased voltage and repolarization changes.  Recent Labwork: 10/30/2015: Magnesium 2.0 11/02/2015: B Natriuretic Peptide 712.0 11/17/2015:  ALT 26; AST 20 11/18/2015: TSH 1.803 12/26/2015: Hemoglobin 12.2; Platelets 189 03/25/2016: BUN 20; Creat 1.08; Potassium 4.3; Sodium 138   Other Studies Reviewed Today:  Echocardiogram 11/03/2015: Study Conclusions  - Left ventricle: The cavity size was normal. Wall thickness was increased in a pattern of mild LVH. Systolic function was normal. The estimated ejection fraction was 55%. Wall motion was normal; there were no regional wall motion abnormalities. The study is not technically sufficient to allow evaluation of LV diastolic function. - Aortic valve: Mildly calcified annulus. Trileaflet. There was mild regurgitation. Mean gradient (S): 3 mm Hg. Valve area (VTI): 2.19 cm^2. Valve area (Vmax): 1.88 cm^2. - Mitral valve: Mildly calcified leaflets . There was mild regurgitation. - Left atrium: The atrium was severely dilated. - Right atrium: The atrium was severely dilated. Central venous pressure (est): 15 mm Hg. - Tricuspid valve: There was mild regurgitation. - Pulmonary arteries: PA peak pressure: 47 mm Hg (S). - Pericardium, extracardiac: There was no pericardial effusion.  Impressions:  - Mild LVH with LVEF approximately 55%. Indeterminate diastolic function in the setting of atrial fibrillation. Mildly calcified mitral leaflets with mild mitral regurgitation. Severe left atrial enlargement. Sclerotic aortic valve with mild aortic regurgitation. Mild tricuspid regurgitation with PASP 47 mmHg. Severe right atrial enlargement. Elevated estimated CVP.  Assessment and Plan:  1. Chronic diastolic heart failure. Leg edema has improved following increase in Lasix dose. Reminded him about sodium restriction. We will continue with current therapy, follow-up with BMET for next visit.  2. Chronic atrial fibrillation, heart rate adequately controlled on, native Coreg and Cardizem CD. He continues on Xarelto for stroke prophylaxis, no reported bleeding  problems.  Current medicines were reviewed with the patient today.   Orders Placed This Encounter  Procedures  . Basic Metabolic Panel (BMET)    Disposition: Follow up in 4 months.  Signed, Satira Sark, MD, [facility] 04/24/2016 8:51 AM    [place] at [facility] [address], [place], [place] [address] Phone: [phone]; Fax: [phone]

## 2016-04-24 ENCOUNTER — Encounter: Payer: Self-pay | Admitting: Cardiology

## 2016-04-24 ENCOUNTER — Ambulatory Visit (INDEPENDENT_AMBULATORY_CARE_PROVIDER_SITE_OTHER): Payer: Medicare Other | Admitting: Cardiology

## 2016-04-24 VITALS — BP 122/80 | HR 86 | Ht 70.0 in | Wt 175.0 lb

## 2016-04-24 DIAGNOSIS — I5032 Chronic diastolic (congestive) heart failure: Secondary | ICD-10-CM

## 2016-04-24 DIAGNOSIS — I482 Chronic atrial fibrillation, unspecified: Secondary | ICD-10-CM

## 2016-04-24 DIAGNOSIS — Z79899 Other long term (current) drug therapy: Secondary | ICD-10-CM | POA: Diagnosis not present

## 2016-04-24 NOTE — Patient Instructions (Signed)
Medication Instructions:  Your physician recommends that you continue on your current medications as directed. Please refer to the Current Medication list given to you today.   Labwork: Your physician recommends that you return for lab work in: [place] [address] [place]    Testing/Procedures: NONE  Follow-Up: Your physician wants you to follow-up in: 4 MONTHS .  You will receive a reminder letter in the mail two months in advance. If you don't receive a letter, please call our office to schedule the follow-up appointment.   Any Other Special Instructions Will Be Listed Below (If Applicable).     If you need a refill on your cardiac medications before your next appointment, please call your pharmacy.

## 2016-08-17 ENCOUNTER — Emergency Department (HOSPITAL_COMMUNITY): Payer: Medicare Other

## 2016-08-17 ENCOUNTER — Inpatient Hospital Stay (HOSPITAL_COMMUNITY)
Admission: EM | Admit: 2016-08-17 | Discharge: 2016-08-20 | DRG: 445 | Disposition: A | Discharge status: Home / Self Care | Payer: Medicare Other | Attending: Internal Medicine | Admitting: Internal Medicine

## 2016-08-17 ENCOUNTER — Encounter (HOSPITAL_COMMUNITY): Payer: Self-pay | Admitting: Emergency Medicine

## 2016-08-17 DIAGNOSIS — K769 Liver disease, unspecified: Secondary | ICD-10-CM | POA: Diagnosis present

## 2016-08-17 DIAGNOSIS — Z8673 Personal history of transient ischemic attack (TIA), and cerebral infarction without residual deficits: Secondary | ICD-10-CM

## 2016-08-17 DIAGNOSIS — Z85828 Personal history of other malignant neoplasm of skin: Secondary | ICD-10-CM

## 2016-08-17 DIAGNOSIS — I482 Chronic atrial fibrillation: Secondary | ICD-10-CM | POA: Diagnosis present

## 2016-08-17 DIAGNOSIS — Z7952 Long term (current) use of systemic steroids: Secondary | ICD-10-CM | POA: Diagnosis not present

## 2016-08-17 DIAGNOSIS — R945 Abnormal results of liver function studies: Secondary | ICD-10-CM

## 2016-08-17 DIAGNOSIS — I110 Hypertensive heart disease with heart failure: Secondary | ICD-10-CM | POA: Diagnosis present

## 2016-08-17 DIAGNOSIS — I10 Essential (primary) hypertension: Secondary | ICD-10-CM

## 2016-08-17 DIAGNOSIS — K838 Other specified diseases of biliary tract: Secondary | ICD-10-CM | POA: Diagnosis not present

## 2016-08-17 DIAGNOSIS — I4891 Unspecified atrial fibrillation: Secondary | ICD-10-CM | POA: Diagnosis present

## 2016-08-17 DIAGNOSIS — K831 Obstruction of bile duct: Secondary | ICD-10-CM | POA: Diagnosis present

## 2016-08-17 DIAGNOSIS — K869 Disease of pancreas, unspecified: Secondary | ICD-10-CM

## 2016-08-17 DIAGNOSIS — Z66 Do not resuscitate: Secondary | ICD-10-CM | POA: Diagnosis present

## 2016-08-17 DIAGNOSIS — K8689 Other specified diseases of pancreas: Secondary | ICD-10-CM | POA: Diagnosis present

## 2016-08-17 DIAGNOSIS — Z7901 Long term (current) use of anticoagulants: Secondary | ICD-10-CM

## 2016-08-17 DIAGNOSIS — Z79899 Other long term (current) drug therapy: Secondary | ICD-10-CM | POA: Diagnosis not present

## 2016-08-17 DIAGNOSIS — R17 Unspecified jaundice: Secondary | ICD-10-CM | POA: Diagnosis present

## 2016-08-17 DIAGNOSIS — Z87891 Personal history of nicotine dependence: Secondary | ICD-10-CM | POA: Diagnosis not present

## 2016-08-17 DIAGNOSIS — D490 Neoplasm of unspecified behavior of digestive system: Secondary | ICD-10-CM | POA: Diagnosis not present

## 2016-08-17 DIAGNOSIS — I5032 Chronic diastolic (congestive) heart failure: Secondary | ICD-10-CM | POA: Diagnosis present

## 2016-08-17 DIAGNOSIS — H409 Unspecified glaucoma: Secondary | ICD-10-CM | POA: Diagnosis present

## 2016-08-17 DIAGNOSIS — R7989 Other specified abnormal findings of blood chemistry: Secondary | ICD-10-CM | POA: Diagnosis present

## 2016-08-17 LAB — COMPREHENSIVE METABOLIC PANEL
ALBUMIN: 4.2 g/dL (ref 3.5–5.0)
ALK PHOS: 195 U/L — AB (ref 38–126)
ALT: 631 U/L — ABNORMAL HIGH (ref 17–63)
AST: 248 U/L — AB (ref 15–41)
Anion gap: 11 (ref 5–15)
BILIRUBIN TOTAL: 13.6 mg/dL — AB (ref 0.3–1.2)
BUN: 17 mg/dL (ref 6–20)
CALCIUM: 9.8 mg/dL (ref 8.9–10.3)
CO2: 29 mmol/L (ref 22–32)
Chloride: 95 mmol/L — ABNORMAL LOW (ref 101–111)
Creatinine, Ser: 0.78 mg/dL (ref 0.61–1.24)
GFR calc Af Amer: >60 mL/min (ref >60)
GFR calc non Af Amer: >60 mL/min (ref >60)
GLUCOSE: 112 mg/dL — AB (ref 65–99)
Potassium: 3.7 mmol/L (ref 3.5–5.1)
Sodium: 135 mmol/L (ref 135–145)
TOTAL PROTEIN: 7 g/dL (ref 6.5–8.1)

## 2016-08-17 LAB — URINALYSIS, ROUTINE W REFLEX MICROSCOPIC
BILIRUBIN URINE: NEGATIVE
GLUCOSE, UA: NEGATIVE mg/dL
HGB URINE DIPSTICK: NEGATIVE
KETONES UR: NEGATIVE mg/dL
Leukocytes, UA: NEGATIVE
Nitrite: NEGATIVE
PH: 7 (ref 5.0–8.0)
Protein, ur: NEGATIVE mg/dL
Specific Gravity, Urine: 1.005 (ref 1.005–1.030)

## 2016-08-17 LAB — CBC WITH DIFFERENTIAL/PLATELET
BASOS ABS: 0 10*3/uL (ref 0.0–0.1)
BASOS PCT: 0 %
Eosinophils Absolute: 0.2 10*3/uL (ref 0.0–0.7)
Eosinophils Relative: 3 %
HEMATOCRIT: 38.4 % — AB (ref 39.0–52.0)
HEMOGLOBIN: 13.3 g/dL (ref 13.0–17.0)
Lymphocytes Relative: 11 %
Lymphs Abs: 0.8 10*3/uL (ref 0.7–4.0)
MCH: 31.6 pg (ref 26.0–34.0)
MCHC: 34.6 g/dL (ref 30.0–36.0)
MCV: 91.2 fL (ref 78.0–100.0)
MONOS PCT: 10 %
Monocytes Absolute: 0.8 10*3/uL (ref 0.1–1.0)
NEUTROS ABS: 5.5 10*3/uL (ref 1.7–7.7)
NEUTROS PCT: 76 %
Platelets: 179 10*3/uL (ref 150–400)
RBC: 4.21 MIL/uL — ABNORMAL LOW (ref 4.22–5.81)
RDW: 15.3 % (ref 11.5–15.5)
WBC: 7.3 10*3/uL (ref 4.0–10.5)

## 2016-08-17 LAB — LIPASE, BLOOD: Lipase: 39 U/L (ref 11–51)

## 2016-08-17 MED ORDER — PREDNISOLONE ACETATE 1 % OP SUSP
1.0000 [drp] | Freq: Four times a day (QID) | OPHTHALMIC | Status: DC
Start: 2016-08-17 — End: 2016-08-20
  Administered 2016-08-17 – 2016-08-20 (×9): 1 [drp] via OPHTHALMIC
  Filled 2016-08-17: qty 1

## 2016-08-17 MED ORDER — HYDROXYZINE HCL 25 MG PO TABS: 25.0000 mg | ORAL_TABLET | Freq: Three times a day (TID) | ORAL | Status: DC | PRN

## 2016-08-17 MED ORDER — BRIMONIDINE TARTRATE-TIMOLOL 0.2-0.5 % OP SOLN
1.0000 [drp] | Freq: Two times a day (BID) | OPHTHALMIC | Status: DC
  Filled 2016-08-17: qty 5

## 2016-08-17 MED ORDER — POLYETHYLENE GLYCOL 3350 17 G PO PACK
17.0000 g | PACK | Freq: Every day | ORAL | Status: DC
  Administered 2016-08-17 – 2016-08-19 (×3): 17 g via ORAL
  Filled 2016-08-17 (×3): qty 1

## 2016-08-17 MED ORDER — POLYETHYLENE GLYCOL 3350 17 G PO PACK
17.0000 g | PACK | Freq: Every day | ORAL | Status: DC
  Administered 2016-08-18: 17 g via ORAL
  Filled 2016-08-17: qty 1

## 2016-08-17 MED ORDER — FUROSEMIDE 40 MG PO TABS
40.0000 mg | ORAL_TABLET | Freq: Every day | ORAL | Status: DC
  Administered 2016-08-18 – 2016-08-20 (×3): 40 mg via ORAL
  Filled 2016-08-17 (×3): qty 1

## 2016-08-17 MED ORDER — CARVEDILOL 12.5 MG PO TABS
25.0000 mg | ORAL_TABLET | Freq: Two times a day (BID) | ORAL | Status: DC
  Administered 2016-08-17 – 2016-08-20 (×5): 25 mg via ORAL
  Filled 2016-08-17 (×5): qty 2

## 2016-08-17 MED ORDER — HEPARIN SODIUM (PORCINE) 5000 UNIT/ML IJ SOLN
5000.0000 [IU] | Freq: Three times a day (TID) | INTRAMUSCULAR | Status: DC
  Administered 2016-08-18: 5000 [IU] via SUBCUTANEOUS
  Filled 2016-08-17: qty 1

## 2016-08-17 MED ORDER — ACETAMINOPHEN 650 MG RE SUPP: 650.0000 mg | Freq: Four times a day (QID) | RECTAL | Status: DC | PRN

## 2016-08-17 MED ORDER — LATANOPROST 0.005 % OP SOLN
1.0000 [drp] | Freq: Every day | OPHTHALMIC | Status: DC
  Administered 2016-08-19: 1 [drp] via OPHTHALMIC
  Filled 2016-08-17: qty 2.5

## 2016-08-17 MED ORDER — ONDANSETRON HCL 4 MG PO TABS: 4.0000 mg | ORAL_TABLET | Freq: Four times a day (QID) | ORAL | Status: DC | PRN

## 2016-08-17 MED ORDER — SODIUM CHLORIDE 0.9 % IV SOLN: 250.0000 mL | INTRAVENOUS | Status: DC | PRN

## 2016-08-17 MED ORDER — SODIUM CHLORIDE 0.9% FLUSH
3.0000 mL | Freq: Two times a day (BID) | INTRAVENOUS | Status: DC
  Administered 2016-08-17 – 2016-08-19 (×5): 3 mL via INTRAVENOUS

## 2016-08-17 MED ORDER — POTASSIUM CHLORIDE CRYS ER 10 MEQ PO TBCR
10.0000 meq | EXTENDED_RELEASE_TABLET | Freq: Every day | ORAL | Status: DC
  Administered 2016-08-17 – 2016-08-20 (×4): 10 meq via ORAL
  Filled 2016-08-17 (×6): qty 1

## 2016-08-17 MED ORDER — ONDANSETRON HCL 4 MG/2ML IJ SOLN: 4.0000 mg | Freq: Four times a day (QID) | INTRAMUSCULAR | Status: DC | PRN

## 2016-08-17 MED ORDER — DOCUSATE SODIUM 100 MG PO CAPS
100.0000 mg | ORAL_CAPSULE | Freq: Every day | ORAL | Status: DC
  Administered 2016-08-17 – 2016-08-20 (×4): 100 mg via ORAL
  Filled 2016-08-17 (×4): qty 1

## 2016-08-17 MED ORDER — TRAZODONE HCL 50 MG PO TABS
100.0000 mg | ORAL_TABLET | Freq: Every evening | ORAL | Status: DC | PRN
  Administered 2016-08-17 – 2016-08-19 (×2): 100 mg via ORAL
  Filled 2016-08-17 (×2): qty 2

## 2016-08-17 MED ORDER — ACETAMINOPHEN 325 MG PO TABS
650.0000 mg | ORAL_TABLET | Freq: Four times a day (QID) | ORAL | Status: DC | PRN
Start: 2016-08-17 — End: 2016-08-20

## 2016-08-17 MED ORDER — IOPAMIDOL (ISOVUE-300) INJECTION 61%
100.0000 mL | Freq: Once | INTRAVENOUS | Status: AC | PRN
  Administered 2016-08-17: 100 mL via INTRAVENOUS

## 2016-08-17 MED ORDER — SODIUM CHLORIDE 0.9% FLUSH: 3.0000 mL | INTRAVENOUS | Status: DC | PRN

## 2016-08-17 MED ORDER — PREDNISOLONE ACETATE 1 % OP SUSP
OPHTHALMIC | Status: AC
  Filled 2016-08-17: qty 5

## 2016-08-17 MED ORDER — DILTIAZEM HCL ER COATED BEADS 120 MG PO CP24
120.0000 mg | ORAL_CAPSULE | Freq: Every day | ORAL | Status: DC
  Administered 2016-08-18 – 2016-08-20 (×3): 120 mg via ORAL
  Filled 2016-08-17 (×3): qty 1

## 2016-08-17 MED ORDER — BRIMONIDINE TARTRATE 0.15 % OP SOLN
1.0000 [drp] | Freq: Two times a day (BID) | OPHTHALMIC | Status: DC
  Filled 2016-08-17: qty 5

## 2016-08-17 NOTE — ED Notes (Signed)
Patient transported to CT 

## 2016-08-17 NOTE — Consult Note (Signed)
Referring Provider: Kathie Dike, MD Primary Care Physician:  Zachary Squibb, MD Primary Gastroenterologist:  Dr. Laural Arellano  Reason for Consultation:   Painless jaundice. Pancreatic mass.  HPI:   Patient is [name] well who came to emergency room earlier today because of jaundice that he discovered 2 days ago. On workup he was found to have dilated biliary system and pancreatic head mass. He is being hospitalized for further management. I last saw patient in August 2017 for fecal impaction when he was hospitalized for fall secondary to orthostatic hypotension. According to his wife he has not had bowel problems anymore. About 3 days ago he is noted his urine to be tea alert. Yesterday he noted his eyes to be yellow. He has not experienced abdominal pain. Pruritus nausea vomiting or acholic stools. He also denies melena or rectal bleeding. He has never been screened for CRC.     Past Medical History:  Diagnosis Date  . Chronic atrial fibrillation ([place])   . Essential hypertension   . Glaucoma   . History of stroke    Left cerebellum.  . Rib fracture        's to patient.  Past Surgical History:  Procedure Laterality Date  . CATARACT EXTRACTION    . Skin cancer removal      Prior to Admission medications   Medication Sig Start Date End Date Taking? Authorizing Provider  B Complex-C (B-COMPLEX WITH VITAMIN C) tablet Take 1 tablet by mouth daily.   Yes [provider]  bimatoprost (LUMIGAN) 0.03 % ophthalmic solution Place 1 drop into both eyes daily.   Yes [provider]  brimonidine (ALPHAGAN) 0.15 % ophthalmic solution Place 1 drop into the left eye 2 (two) times daily.   Yes [provider]  brimonidine-timolol (COMBIGAN) 0.2-0.5 % ophthalmic solution Place 1 drop into both eyes 2 (two) times daily.   Yes [provider]  carvedilol (COREG) 25 MG tablet Take 1 tablet (25 mg total) by mouth 2 (two) times daily with a meal.  11/27/15  Yes Memon, Jolaine Artist, MD  diltiazem (CARDIZEM CD) 120 MG 24 hr capsule Take 1 capsule (120 mg total) by mouth daily. 02/06/16  Yes Hilty, Nadean Corwin, MD  docusate sodium (COLACE) 100 MG capsule Take 1 capsule (100 mg total) by mouth daily. 11/06/15  Yes Donne Hazel, MD  furosemide (LASIX) 40 MG tablet Take 1 tablet (40 mg total) by mouth daily. 03/14/16 08/17/16 Yes Satira Sark, MD  Multiple Vitamin (MULTIVITAMIN WITH MINERALS) TABS tablet Take 1 tablet by mouth daily.   Yes [provider]  Multiple Vitamins-Minerals (VISION FORMULA EYE HEALTH) CAPS Take 1 capsule by mouth daily.   Yes [provider]  polyethylene glycol (MIRALAX / GLYCOLAX) packet Take 17 g by mouth daily. 11/27/15  Yes Zachary Dike, MD  potassium chloride (K-DUR) 10 MEQ tablet Take 1 tablet (10 mEq total) by mouth daily. 02/06/16  Yes Hilty, Nadean Corwin, MD  prednisoLONE acetate (PRED FORTE) 1 % ophthalmic suspension Place 1 drop into both eyes 4 (four) times daily.  07/10/16  Yes [provider]  XARELTO 20 MG TABS tablet TAKE ONE (1) TABLET EACH DAY 04/13/15  Yes Hilty, Nadean Corwin, MD    No current facility-administered medications for this encounter.    Current Outpatient Prescriptions  Medication Sig Dispense Refill  . B Complex-C (B-COMPLEX WITH VITAMIN C) tablet Take 1 tablet by mouth daily.    . bimatoprost (LUMIGAN) 0.03 % ophthalmic solution  Place 1 drop into both eyes daily.    . brimonidine (ALPHAGAN) 0.15 % ophthalmic solution Place 1 drop into the left eye 2 (two) times daily.    . brimonidine-timolol (COMBIGAN) 0.2-0.5 % ophthalmic solution Place 1 drop into both eyes 2 (two) times daily.    . carvedilol (COREG) 25 MG tablet Take 1 tablet (25 mg total) by mouth 2 (two) times daily with a meal.    . diltiazem (CARDIZEM CD) 120 MG 24 hr capsule Take 1 capsule (120 mg total) by mouth daily. 90 capsule 3  . docusate sodium (COLACE) 100 MG capsule Take 1 capsule (100 mg  total) by mouth daily. 10 capsule 0  . furosemide (LASIX) 40 MG tablet Take 1 tablet (40 mg total) by mouth daily. 90 tablet 3  . Multiple Vitamin (MULTIVITAMIN WITH MINERALS) TABS tablet Take 1 tablet by mouth daily.    . Multiple Vitamins-Minerals (VISION FORMULA EYE HEALTH) CAPS Take 1 capsule by mouth daily.    . polyethylene glycol (MIRALAX / GLYCOLAX) packet Take 17 g by mouth daily. 14 each 0  . potassium chloride (K-DUR) 10 MEQ tablet Take 1 tablet (10 mEq total) by mouth daily. 90 tablet 3  . prednisoLONE acetate (PRED FORTE) 1 % ophthalmic suspension Place 1 drop into both eyes 4 (four) times daily.     Alveda Reasons 20 MG TABS tablet TAKE ONE (1) TABLET EACH DAY 30 tablet 9    Allergies as of 08/17/2016  . (No Known Allergies)    Family History  Problem Relation Age of Onset  . Cancer Mother   . Heart disease Father   . Emphysema Father   . Heart disease Brother     Social History   Social History  . Marital status: Married    Spouse name: N/A  . Number of children: 4  . Years of education: N/A   Occupational History  . Not on file.   Social History Main Topics  . Smoking status: Former Research scientist (life sciences)  . Smokeless tobacco: Former Systems developer     Comment: quit smoking in his 47's  . Alcohol use No  . Drug use: No  . Sexual activity: Not on file   Other Topics Concern  . Not on file   Social History Narrative  . No narrative on file    Review of Systems: See HPI, otherwise normal ROS  Physical Exam: Temp:  [98 F (36.7 C)] 98 F (36.7 C) (05/05 0921) Pulse Rate:  [67-79] 79 (05/05 1041) Resp:  [18] 18 (05/05 1041) BP: (108-126)/(67-83) 126/83 (05/05 1041) SpO2:  [99 %-100 %] 99 % (05/05 1041) Weight:  [175 lb 14.4 oz (79.8 kg)] 175 lb 14.4 oz (79.8 kg) (05/05 0921)   Patient is alert and in no acute distress. Conjunctiva is somewhat injected. Sclerae deeply icteric. Oropharyngeal mucosa is normal. No neck masses or thyromegaly noted. Cardiac exam with regular  rhythm normal S1 and S2. No murmur or gallop noted. Abdomen is symmetrical. Bowel sounds are normal. On palpation abdomen is soft and nontender without organomegaly or masses. No peripheral edema or clubbing noted.     Lab Results:  Recent Labs  08/17/16 0940  WBC 7.3  HGB 13.3  HCT 38.4*  PLT 179   BMET  Recent Labs  08/17/16 0940  NA 135  K 3.7  CL 95*  CO2 29  GLUCOSE 112*  BUN 17  CREATININE 0.78  CALCIUM 9.8   LFT  Recent Labs  08/17/16 0940  PROT 7.0  ALBUMIN 4.2  AST 248*  ALT 631*  ALKPHOS 195*  BILITOT 13.6*   PT/INR No results for input(s): LABPROT, INR in the last 72 hours. Hepatitis Panel No results for input(s): HEPBSAG, HCVAB, HEPAIGM, HEPBIGM in the last 72 hours.  Studies/Results: Ct Abdomen Pelvis W Contrast  Result Date: 08/17/2016 CLINICAL DATA:  Painless jaundice for 3 days. History of atrial fibrillation, hypertension and stroke. EXAM: CT ABDOMEN AND PELVIS WITH CONTRAST TECHNIQUE: Multidetector CT imaging of the abdomen and pelvis was performed using the standard protocol following bolus administration of intravenous contrast. CONTRAST:  132mL ISOVUE-300 IOPAMIDOL (ISOVUE-300) INJECTION 61% COMPARISON:  CT 11/18/2015.  MRI 12/14/2015. FINDINGS: Lower chest: The heart is enlarged. The lung bases are clear. There is no pleural or pericardial effusion. Hepatobiliary: There is new intrahepatic and extrahepatic biliary dilatation. The common hepatic duct is dilated to up to 1.5 cm and demonstrates abrupt change in caliber at the level of the superior pancreatic head. There are multiple ill-defined low-density hepatic lesions which are not clearly seen on prior studies, worrisome for metastatic disease. These include a 10 mm lesion posteriorly in the right lobe on image 21, a 7 mm lesion on image 23 and an 11 mm image on 26 (both near the origin of the caudate lobe), at a 9 mm lesion inferiorly on image number 30. The gallbladder is decompressed.  There is some increased density within the gallbladder wall without surrounding inflammation. Pancreas: There is an ill-defined low-density area superiorly in the pancreatic head near the transition in the caliber of the common bile duct, worrisome for a pancreatic malignancy. This measures approximately 15 x 17 mm on axial image 29 of series 2. The pancreatic duct is not significantly dilated. There is no surrounding inflammation. Spleen: Normal in size without focal abnormality. Adrenals/Urinary Tract: Both adrenal glands appear normal. Stable left renal cysts. In addition, there is a stable 10 mm lesion posteriorly in the mid left kidney (image 30), corresponding with the indeterminate lesion on previous studies. No evidence of urinary tract calculus or hydronephrosis. The bladder appears unremarkable. Stomach/Bowel: No evidence of bowel wall thickening, distention or surrounding inflammatory change. Vascular/Lymphatic: There are no enlarged abdominal or pelvic lymph nodes. There is diffuse aortic and branch vessel atherosclerosis. The suspected pancreatic mass abuts the undersurface of the main portal vein, although does not encase it. There is no arterial encasement or large vessel occlusion. Reproductive: The prostate gland and seminal vesicles appear normal. There is soft tissue density in the right inguinal canal which may reflect retraction of the right testis or a small hydrocele. Other: No ascites or peritoneal nodularity. Musculoskeletal: No acute or significant osseous findings. Multilevel spondylosis noted associated with a convex left scoliosis. IMPRESSION: 1. Intrahepatic and extrahepatic biliary dilatation with abrupt transition in duct caliber at the level of the pancreatic head adjacent to a hypodensity. Findings are highly worrisome for pancreatic adenocarcinoma. 2. New low-density hepatic lesions, worrisome for metastatic disease. 3. Previously described left renal lesions are stable. 4. Aortic  atherosclerosis. No evidence of tumor vascular encasement. 5. GI consultation for ERCP, stenting and tissue sampling recommended. Electronically Signed   By: Richardean Sale M.D.   On: 08/17/2016 11:53  I have reviewed abdominopelvic CT on this morning.  I also reviewed abdominal CT and MRI from August 2017 revealing no pancreatic mass or dilated biliary system.   Assessment; Patient is [name] who presents with painless jaundice. CT reveals dilated intra-and extrahepatic biliary system with obstruction  in the region of pancreatic head mass concerning for malignant neoplasm. Patient will need ERCP for biliary decompression. Patient is on Xarelto therefore procedure will be delayed until 08/19/2016.   Recommendations;  Hold anticoagulants for now. CA 19-9. ERCP with biliary stenting and brushing cytology on 08/19/2016.   LOS: 0 days   Zachary Arellano  08/17/2016, 1:56 PM

## 2016-08-17 NOTE — H&P (Signed)
History and Physical    Zachary Arellano QMV:[id number] DOB: [birth year] DOA: 08/17/2016  PCP: Celene Squibb, MD  Patient coming from: home  I have personally briefly reviewed patient's old medical records in [place]  Chief Complaint: jaundice  HPI: Zachary Arellano is a 83 y.o. male with medical history significant of atrial fibrillation, congestive heart failure, reports that for the past 2-3 days he's noticed that his urine has become darker and his skin was starting to turn yellow. He did not have any abdominal pain. No fever, no nausea, vomiting. He is not having night sweats. No weight loss. No diarrhea. He went to his primary care physician today for evaluation and was sent to the ER.  ED Course: Vitals were noted to be stable in the emergency room. Liver function tests were elevated with AST of 248, ALT of 631, total bilirubin 13.6. CT of the abdomen and pelvis indicated Intrahepatic and extrahepatic biliary dilatation with abrupt transition in duct caliber at the level of the pancreatic head adjacent to a hypodensity. Findings are highly worrisome for pancreatic adenocarcinoma.  Review of Systems: As per HPI otherwise 10 point review of systems negative.    Past Medical History:  Diagnosis Date  . Chronic atrial fibrillation ([place])   . Essential hypertension   . Glaucoma   . History of stroke    Left cerebellum.  . Rib fracture     Past Surgical History:  Procedure Laterality Date  . CATARACT EXTRACTION    . Skin cancer removal       reports that he has quit smoking. He has quit using smokeless tobacco. He reports that he does not drink alcohol or use drugs.  No Known Allergies  Family History  Problem Relation Age of Onset  . Cancer Mother   . Heart disease Father   . Emphysema Father   . Heart disease Brother      Prior to Admission medications   Medication Sig Start Date End Date Taking? Authorizing Provider  B Complex-C (B-COMPLEX WITH VITAMIN C) tablet  Take 1 tablet by mouth daily.   Yes [provider]  bimatoprost (LUMIGAN) 0.03 % ophthalmic solution Place 1 drop into both eyes daily.   Yes [provider]  brimonidine (ALPHAGAN) 0.15 % ophthalmic solution Place 1 drop into the left eye 2 (two) times daily.   Yes [provider]  brimonidine-timolol (COMBIGAN) 0.2-0.5 % ophthalmic solution Place 1 drop into both eyes 2 (two) times daily.   Yes [provider]  carvedilol (COREG) 25 MG tablet Take 1 tablet (25 mg total) by mouth 2 (two) times daily with a meal. 11/27/15  Yes Memon, Jolaine Artist, MD  diltiazem (CARDIZEM CD) 120 MG 24 hr capsule Take 1 capsule (120 mg total) by mouth daily. 02/06/16  Yes Hilty, Nadean Corwin, MD  docusate sodium (COLACE) 100 MG capsule Take 1 capsule (100 mg total) by mouth daily. 11/06/15  Yes Donne Hazel, MD  furosemide (LASIX) 40 MG tablet Take 1 tablet (40 mg total) by mouth daily. 03/14/16 08/17/16 Yes Satira Sark, MD  Multiple Vitamin (MULTIVITAMIN WITH MINERALS) TABS tablet Take 1 tablet by mouth daily.   Yes [provider]  Multiple Vitamins-Minerals (VISION FORMULA EYE HEALTH) CAPS Take 1 capsule by mouth daily.   Yes [provider]  polyethylene glycol (MIRALAX / GLYCOLAX) packet Take 17 g by mouth daily. 11/27/15  Yes Kathie Dike, MD  potassium chloride (K-DUR) 10 MEQ tablet Take 1  tablet (10 mEq total) by mouth daily. 02/06/16  Yes Hilty, Nadean Corwin, MD  prednisoLONE acetate (PRED FORTE) 1 % ophthalmic suspension Place 1 drop into both eyes 4 (four) times daily.  07/10/16  Yes [provider]  XARELTO 20 MG TABS tablet TAKE ONE (1) TABLET EACH DAY 04/13/15  Yes Pixie Casino, MD    Physical Exam: Vitals:   08/17/16 1501 08/17/16 1540 08/17/16 1550 08/17/16 1742  BP: 123/75 133/63    Pulse: 68 60    Resp: 16 18    Temp:  97.9 F (36.6 C)    TempSrc:  Oral    SpO2: 97% 99%    Weight:    78.9 kg (174 lb)  Height:   5\' 10"  (1.778  m)     Constitutional: NAD, calm, comfortable Vitals:   08/17/16 1501 08/17/16 1540 08/17/16 1550 08/17/16 1742  BP: 123/75 133/63    Pulse: 68 60    Resp: 16 18    Temp:  97.9 F (36.6 C)    TempSrc:  Oral    SpO2: 97% 99%    Weight:    78.9 kg (174 lb)  Height:   5\' 10"  (1.778 m)    Eyes: PERRL, lids normal, +scleral icterus ENMT: Mucous membranes are moist. Posterior pharynx clear of any exudate or lesions.Normal dentition.  Neck: normal, supple, no masses, no thyromegaly Respiratory: clear to auscultation bilaterally, no wheezing, no crackles. Normal respiratory effort. No accessory muscle use.  Cardiovascular: Regular rate and rhythm, no murmurs / rubs / gallops. No extremity edema. 2+ pedal pulses. No carotid bruits.  Abdomen: no tenderness, no masses palpated. No hepatosplenomegaly. Bowel sounds positive.  Musculoskeletal: no clubbing / cyanosis. No joint deformity upper and lower extremities. Good ROM, no contractures. Normal muscle tone.  Skin: no rashes, lesions, ulcers. No induration Neurologic: CN 2-12 grossly intact. Sensation intact, DTR normal. Strength 5/5 in all 4.  Psychiatric: Normal judgment and insight. Alert and oriented x 3. Normal mood.   Labs on Admission: I have personally reviewed following labs and imaging studies  CBC:  Recent Labs Lab 08/17/16 0940  WBC 7.3  NEUTROABS 5.5  HGB 13.3  HCT 38.4*  MCV 91.2  PLT 409   Basic Metabolic Panel:  Recent Labs Lab 08/17/16 0940  NA 135  K 3.7  CL 95*  CO2 29  GLUCOSE 112*  BUN 17  CREATININE 0.78  CALCIUM 9.8   GFR: Estimated Creatinine Clearance: 72.2 mL/min (by C-G formula based on SCr of 0.78 mg/dL). Liver Function Tests:  Recent Labs Lab 08/17/16 0940  AST 248*  ALT 631*  ALKPHOS 195*  BILITOT 13.6*  PROT 7.0  ALBUMIN 4.2    Recent Labs Lab 08/17/16 0940  LIPASE 39   No results for input(s): AMMONIA in the last 168 hours. Coagulation Profile: No results for input(s):  INR, PROTIME in the last 168 hours. Cardiac Enzymes: No results for input(s): CKTOTAL, CKMB, CKMBINDEX, TROPONINI in the last 168 hours. BNP (last 3 results) No results for input(s): PROBNP in the last 8760 hours. HbA1C: No results for input(s): HGBA1C in the last 72 hours. CBG: No results for input(s): GLUCAP in the last 168 hours. Lipid Profile: No results for input(s): CHOL, HDL, LDLCALC, TRIG, CHOLHDL, LDLDIRECT in the last 72 hours. Thyroid Function Tests: No results for input(s): TSH, T4TOTAL, FREET4, T3FREE, THYROIDAB in the last 72 hours. Anemia Panel: No results for input(s): VITAMINB12, FOLATE, FERRITIN, TIBC, IRON, RETICCTPCT in the last 72 hours.  Urine analysis:    Component Value Date/Time   COLORURINE AMBER (A) 08/17/2016 0940   APPEARANCEUR CLEAR 08/17/2016 0940   LABSPEC 1.005 08/17/2016 0940   PHURINE 7.0 08/17/2016 0940   GLUCOSEU NEGATIVE 08/17/2016 0940   HGBUR NEGATIVE 08/17/2016 0940   BILIRUBINUR NEGATIVE 08/17/2016 0940   KETONESUR NEGATIVE 08/17/2016 0940   PROTEINUR NEGATIVE 08/17/2016 0940   NITRITE NEGATIVE 08/17/2016 0940   LEUKOCYTESUR NEGATIVE 08/17/2016 0940    Radiological Exams on Admission: Ct Abdomen Pelvis W Contrast  Result Date: 08/17/2016 CLINICAL DATA:  Painless jaundice for 3 days. History of atrial fibrillation, hypertension and stroke. EXAM: CT ABDOMEN AND PELVIS WITH CONTRAST TECHNIQUE: Multidetector CT imaging of the abdomen and pelvis was performed using the standard protocol following bolus administration of intravenous contrast. CONTRAST:  177mL ISOVUE-300 IOPAMIDOL (ISOVUE-300) INJECTION 61% COMPARISON:  CT 11/18/2015.  MRI 12/14/2015. FINDINGS: Lower chest: The heart is enlarged. The lung bases are clear. There is no pleural or pericardial effusion. Hepatobiliary: There is new intrahepatic and extrahepatic biliary dilatation. The common hepatic duct is dilated to up to 1.5 cm and demonstrates abrupt change in caliber at the level of  the superior pancreatic head. There are multiple ill-defined low-density hepatic lesions which are not clearly seen on prior studies, worrisome for metastatic disease. These include a 10 mm lesion posteriorly in the right lobe on image 21, a 7 mm lesion on image 23 and an 11 mm image on 26 (both near the origin of the caudate lobe), at a 9 mm lesion inferiorly on image number 30. The gallbladder is decompressed. There is some increased density within the gallbladder wall without surrounding inflammation. Pancreas: There is an ill-defined low-density area superiorly in the pancreatic head near the transition in the caliber of the common bile duct, worrisome for a pancreatic malignancy. This measures approximately 15 x 17 mm on axial image 29 of series 2. The pancreatic duct is not significantly dilated. There is no surrounding inflammation. Spleen: Normal in size without focal abnormality. Adrenals/Urinary Tract: Both adrenal glands appear normal. Stable left renal cysts. In addition, there is a stable 10 mm lesion posteriorly in the mid left kidney (image 30), corresponding with the indeterminate lesion on previous studies. No evidence of urinary tract calculus or hydronephrosis. The bladder appears unremarkable. Stomach/Bowel: No evidence of bowel wall thickening, distention or surrounding inflammatory change. Vascular/Lymphatic: There are no enlarged abdominal or pelvic lymph nodes. There is diffuse aortic and branch vessel atherosclerosis. The suspected pancreatic mass abuts the undersurface of the main portal vein, although does not encase it. There is no arterial encasement or large vessel occlusion. Reproductive: The prostate gland and seminal vesicles appear normal. There is soft tissue density in the right inguinal canal which may reflect retraction of the right testis or a small hydrocele. Other: No ascites or peritoneal nodularity. Musculoskeletal: No acute or significant osseous findings. Multilevel  spondylosis noted associated with a convex left scoliosis. IMPRESSION: 1. Intrahepatic and extrahepatic biliary dilatation with abrupt transition in duct caliber at the level of the pancreatic head adjacent to a hypodensity. Findings are highly worrisome for pancreatic adenocarcinoma. 2. New low-density hepatic lesions, worrisome for metastatic disease. 3. Previously described left renal lesions are stable. 4. Aortic atherosclerosis. No evidence of tumor vascular encasement. 5. GI consultation for ERCP, stenting and tissue sampling recommended. Electronically Signed   By: Richardean Sale M.D.   On: 08/17/2016 11:53    Assessment/Plan Active Problems:   Atrial fibrillation [facility])   Essential hypertension  Chronic diastolic CHF (congestive heart failure) (HCC)   Elevated LFTs   Pancreatic mass   Obstructive jaundice   1. Obstructive jaundice due to pancreatic mass. Findings worrisome for pancreatic adenocarcinoma. Gastroenterology has been consulted. He will likely need a biliary stent placed. ERCP is planned for 5/7. He is on xarelto for atrial fibrillation which will need to be held. There is also concerning for metastatic deposits in the liver.  2. Chronic Atrial fibrillation. Continue Coreg and diltiazem. Anticoagulation currently on hold in light of #1.  3. Chronic diastolic CHF. Appears compensated. Continue home dose of Lasix.  4. Hypertension. Continue on Coreg and diltiazem. Blood pressure is stable.  DVT prophylaxis: heparin  Code Status: DNR Family Communication: discussed with wife at the bedside Disposition Plan: discharge home once improved Consults called: gastroenterology Admission status: inpatient, Dollene Cleveland MD [facility] Pager [phone]  If 7PM-7AM, please contact night-coverage [email or web address] Password [email or web address]  08/17/2016, 6:00 PM

## 2016-08-17 NOTE — ED Notes (Signed)
Attempted to call report, unable to reach nurse, will try again

## 2016-08-17 NOTE — ED Provider Notes (Signed)
[facility] DEPT Provider Note   CSN: [id number] Arrival date & time: 08/17/16  1443   By signing my name below, I, Hilbert Odor, attest that this documentation has been prepared under the direction and in the presence of Nat Christen, MD. Electronically Signed: Hilbert Odor, Scribe. 08/17/16. 9:43 AM. History   Chief Complaint Chief Complaint  Patient presents with  . Jaundice    The history is provided by the patient. No language interpreter was used.  HPI Comments: Zachary Arellano is a 83 y.o. male who presents to the Emergency Department for jaundice after going to see Dr. Nevada Crane today. The patient states that he has had jaundice for the past several days. He states that he was seen at Dr. Juel Burrow office today and was advised to come here for an evaluation of his jaundice. He states that this has ever happened in the past. The patient has no complaints. He denies nausea, vomiting, fevers/chills, abdominal pain, or any other pain.   Past Medical History:  Diagnosis Date  . Chronic atrial fibrillation ([place])   . Essential hypertension   . Glaucoma   . History of stroke    Left cerebellum.  . Rib fracture     Patient Active Problem List   Diagnosis Date Noted  . Multiple renal cysts 01/02/2016  . Atrial fibrillation with RVR ([place]) 11/26/2015  . HCAP (healthcare-associated pneumonia) 11/20/2015  . Hypotension 11/20/2015  . Orthostatic hypotension 11/17/2015  . Chronic diastolic CHF (congestive heart failure) ([place]) 11/17/2015  . Tachycardia-bradycardia syndrome ([place])   . Acute diastolic heart failure ([place])   . General weakness   . Rib contusion   . Hyponatremia 10/30/2015  . Hypokalemia 10/30/2015  . Symptomatic bradycardia   . Anticoagulation adequate 10/14/2013  . Atrial fibrillation ([place]) 09/08/2012  . Essential hypertension 09/08/2012    Past Surgical History:  Procedure Laterality Date  . CATARACT EXTRACTION    . Skin cancer removal         Home  Medications    Prior to Admission medications   Medication Sig Start Date End Date Taking? Authorizing Provider  B Complex-C (B-COMPLEX WITH VITAMIN C) tablet Take 1 tablet by mouth daily.   Yes [provider]  bimatoprost (LUMIGAN) 0.03 % ophthalmic solution Place 1 drop into both eyes daily.   Yes [provider]  brimonidine (ALPHAGAN) 0.15 % ophthalmic solution Place 1 drop into the left eye 2 (two) times daily.   Yes [provider]  brimonidine-timolol (COMBIGAN) 0.2-0.5 % ophthalmic solution Place 1 drop into both eyes 2 (two) times daily.   Yes [provider]  carvedilol (COREG) 25 MG tablet Take 1 tablet (25 mg total) by mouth 2 (two) times daily with a meal. 11/27/15  Yes Memon, Jolaine Artist, MD  diltiazem (CARDIZEM CD) 120 MG 24 hr capsule Take 1 capsule (120 mg total) by mouth daily. 02/06/16  Yes Hilty, Nadean Corwin, MD  docusate sodium (COLACE) 100 MG capsule Take 1 capsule (100 mg total) by mouth daily. 11/06/15  Yes Donne Hazel, MD  furosemide (LASIX) 40 MG tablet Take 1 tablet (40 mg total) by mouth daily. 03/14/16 08/17/16 Yes Satira Sark, MD  Multiple Vitamin (MULTIVITAMIN WITH MINERALS) TABS tablet Take 1 tablet by mouth daily.   Yes [provider]  Multiple Vitamins-Minerals (VISION FORMULA EYE HEALTH) CAPS Take 1 capsule by mouth daily.   Yes [provider]  polyethylene glycol (MIRALAX / GLYCOLAX) packet Take 17 g by mouth daily. 11/27/15  Yes Kathie Dike, MD  potassium chloride (K-DUR) 10 MEQ tablet Take 1 tablet (10 mEq total) by mouth daily. 02/06/16  Yes Hilty, Nadean Corwin, MD  prednisoLONE acetate (PRED FORTE) 1 % ophthalmic suspension Place 1 drop into both eyes 4 (four) times daily.  07/10/16  Yes [provider]  XARELTO 20 MG TABS tablet TAKE ONE (1) TABLET EACH DAY 04/13/15  Yes Hilty, Nadean Corwin, MD    Family History Family History  Problem Relation Age of Onset  . Cancer Mother   . Heart  disease Father   . Emphysema Father   . Heart disease Brother     Social History Social History  Substance Use Topics  . Smoking status: Former Research scientist (life sciences)  . Smokeless tobacco: Former Systems developer     Comment: quit smoking in his 35's  . Alcohol use No     Allergies   Patient has no known allergies.   Review of Systems Review of Systems All other systems reviewed and are negative for acute change except as noted in the HPI.  Physical Exam Updated Vital Signs BP 126/83 (BP Location: Left Arm)   Pulse 79   Temp 98 F (36.7 C) (Oral)   Resp 18   Ht 5\' 10"  (1.778 m)   Wt 175 lb 14.4 oz (79.8 kg)   SpO2 99%   BMI 25.24 kg/m   Physical Exam  Constitutional: He is oriented to person, place, and time. He appears well-developed and well-nourished.  HENT:  Head: Normocephalic and atraumatic.  Eyes: Conjunctivae are normal.  Neck: Neck supple.  Cardiovascular: Normal rate and regular rhythm.   Pulmonary/Chest: Effort normal and breath sounds normal.  Abdominal: Soft. Bowel sounds are normal. There is no tenderness.  Abdomen is non-tender.  Musculoskeletal: Normal range of motion.  Neurological: He is alert and oriented to person, place, and time.  Skin: Skin is warm and dry.  Jaundiced.  Psychiatric: He has a normal mood and affect. His behavior is normal.  Nursing note and vitals reviewed.  ED Treatments / Results  DIAGNOSTIC STUDIES: Oxygen Saturation is 100% on RA, normal by my interpretation.   COORDINATION OF CARE: 9:36 AM Discussed treatment plan with pt at bedside and pt agreed to plan. I will check his blood work, UA, and CT scan.  Labs (all labs ordered are listed, but only abnormal results are displayed) Labs Reviewed  CBC WITH DIFFERENTIAL/PLATELET - Abnormal; Notable for the following:       Result Value   RBC 4.21 (*)    HCT 38.4 (*)    All other components within normal limits  COMPREHENSIVE METABOLIC PANEL - Abnormal; Notable for the following:    Chloride  95 (*)    Glucose, Bld 112 (*)    AST 248 (*)    ALT 631 (*)    Alkaline Phosphatase 195 (*)    Total Bilirubin 13.6 (*)    All other components within normal limits  URINALYSIS, ROUTINE W REFLEX MICROSCOPIC - Abnormal; Notable for the following:    Color, Urine AMBER (*)    All other components within normal limits  LIPASE, BLOOD    EKG  EKG Interpretation None       Radiology Ct Abdomen Pelvis W Contrast  Result Date: 08/17/2016 CLINICAL DATA:  Painless jaundice for 3 days. History of atrial fibrillation, hypertension and stroke. EXAM: CT ABDOMEN AND PELVIS WITH CONTRAST TECHNIQUE: Multidetector CT imaging of the abdomen and pelvis was performed using the standard protocol following bolus  administration of intravenous contrast. CONTRAST:  142mL ISOVUE-300 IOPAMIDOL (ISOVUE-300) INJECTION 61% COMPARISON:  CT 11/18/2015.  MRI 12/14/2015. FINDINGS: Lower chest: The heart is enlarged. The lung bases are clear. There is no pleural or pericardial effusion. Hepatobiliary: There is new intrahepatic and extrahepatic biliary dilatation. The common hepatic duct is dilated to up to 1.5 cm and demonstrates abrupt change in caliber at the level of the superior pancreatic head. There are multiple ill-defined low-density hepatic lesions which are not clearly seen on prior studies, worrisome for metastatic disease. These include a 10 mm lesion posteriorly in the right lobe on image 21, a 7 mm lesion on image 23 and an 11 mm image on 26 (both near the origin of the caudate lobe), at a 9 mm lesion inferiorly on image number 30. The gallbladder is decompressed. There is some increased density within the gallbladder wall without surrounding inflammation. Pancreas: There is an ill-defined low-density area superiorly in the pancreatic head near the transition in the caliber of the common bile duct, worrisome for a pancreatic malignancy. This measures approximately 15 x 17 mm on axial image 29 of series 2. The  pancreatic duct is not significantly dilated. There is no surrounding inflammation. Spleen: Normal in size without focal abnormality. Adrenals/Urinary Tract: Both adrenal glands appear normal. Stable left renal cysts. In addition, there is a stable 10 mm lesion posteriorly in the mid left kidney (image 30), corresponding with the indeterminate lesion on previous studies. No evidence of urinary tract calculus or hydronephrosis. The bladder appears unremarkable. Stomach/Bowel: No evidence of bowel wall thickening, distention or surrounding inflammatory change. Vascular/Lymphatic: There are no enlarged abdominal or pelvic lymph nodes. There is diffuse aortic and branch vessel atherosclerosis. The suspected pancreatic mass abuts the undersurface of the main portal vein, although does not encase it. There is no arterial encasement or large vessel occlusion. Reproductive: The prostate gland and seminal vesicles appear normal. There is soft tissue density in the right inguinal canal which may reflect retraction of the right testis or a small hydrocele. Other: No ascites or peritoneal nodularity. Musculoskeletal: No acute or significant osseous findings. Multilevel spondylosis noted associated with a convex left scoliosis. IMPRESSION: 1. Intrahepatic and extrahepatic biliary dilatation with abrupt transition in duct caliber at the level of the pancreatic head adjacent to a hypodensity. Findings are highly worrisome for pancreatic adenocarcinoma. 2. New low-density hepatic lesions, worrisome for metastatic disease. 3. Previously described left renal lesions are stable. 4. Aortic atherosclerosis. No evidence of tumor vascular encasement. 5. GI consultation for ERCP, stenting and tissue sampling recommended. Electronically Signed   By: Richardean Sale M.D.   On: 08/17/2016 11:53    Procedures Procedures (including critical care time)  Medications Ordered in ED Medications  iopamidol (ISOVUE-300) 61 % injection 100 mL  (100 mLs Intravenous Contrast Given 08/17/16 1059)     Initial Impression / Assessment and Plan / ED Course  I have reviewed the triage vital signs and the nursing notes.  Pertinent labs & imaging results that were available during my care of the patient were reviewed by me and considered in my medical decision making (see chart for details).     Patient presents with painless jaundicing.  CT head scan is suspicious for pancreatic mass. Discussed with gastroenterologist Dr. Laural Golden.  He will consult. Admit to general medicine. Discussed with patient and his wife.  Final Clinical Impressions(s) / ED Diagnoses   Final diagnoses:  Jaundice    New Prescriptions New Prescriptions   No medications  on file   I personally performed the services described in this documentation, which was scribed in my presence. The recorded information has been reviewed and is accurate.     Nat Christen, MD 08/17/16 [phone]

## 2016-08-17 NOTE — ED Notes (Signed)
Dr. Laural Golden in room

## 2016-08-17 NOTE — Progress Notes (Signed)
Patient's wife reported "he will probably need something to help him sleep, something for anxiety and his miralax." text-paged MD to notify this evening. Donavan Foil, RN

## 2016-08-17 NOTE — ED Triage Notes (Signed)
Pt sent from Dr. Juel Burrow office for jaundice.  Pt denies any complaints.

## 2016-08-17 NOTE — ED Notes (Signed)
Advised pt I needed a urine sample when he needs to go please call me.

## 2016-08-18 LAB — COMPREHENSIVE METABOLIC PANEL
ALT: 562 U/L — ABNORMAL HIGH (ref 17–63)
ANION GAP: 9 (ref 5–15)
AST: 216 U/L — ABNORMAL HIGH (ref 15–41)
Albumin: 3.6 g/dL (ref 3.5–5.0)
Alkaline Phosphatase: 178 U/L — ABNORMAL HIGH (ref 38–126)
BUN: 13 mg/dL (ref 6–20)
CHLORIDE: 97 mmol/L — AB (ref 101–111)
CO2: 30 mmol/L (ref 22–32)
CREATININE: 0.7 mg/dL (ref 0.61–1.24)
Calcium: 9.3 mg/dL (ref 8.9–10.3)
Glucose, Bld: 102 mg/dL — ABNORMAL HIGH (ref 65–99)
Potassium: 3.4 mmol/L — ABNORMAL LOW (ref 3.5–5.1)
SODIUM: 136 mmol/L (ref 135–145)
Total Bilirubin: 13.1 mg/dL — ABNORMAL HIGH (ref 0.3–1.2)
Total Protein: 6.3 g/dL — ABNORMAL LOW (ref 6.5–8.1)

## 2016-08-18 LAB — CBC
HCT: 37.6 % — ABNORMAL LOW (ref 39.0–52.0)
HEMOGLOBIN: 13.3 g/dL (ref 13.0–17.0)
MCH: 32.7 pg (ref 26.0–34.0)
MCHC: 35.4 g/dL (ref 30.0–36.0)
MCV: 92.4 fL (ref 78.0–100.0)
PLATELETS: 174 10*3/uL (ref 150–400)
RBC: 4.07 MIL/uL — AB (ref 4.22–5.81)
RDW: 15.8 % — ABNORMAL HIGH (ref 11.5–15.5)
WBC: 6.7 10*3/uL (ref 4.0–10.5)

## 2016-08-18 LAB — PROTIME-INR
INR: 1.09
PROTHROMBIN TIME: 14.2 s (ref 11.4–15.2)

## 2016-08-18 MED ORDER — TIMOLOL MALEATE 0.5 % OP SOLN
1.0000 [drp] | Freq: Two times a day (BID) | OPHTHALMIC | Status: DC
  Administered 2016-08-18 – 2016-08-20 (×5): 1 [drp] via OPHTHALMIC
  Filled 2016-08-18: qty 5

## 2016-08-18 MED ORDER — BRIMONIDINE TARTRATE 0.2 % OP SOLN
1.0000 [drp] | Freq: Two times a day (BID) | OPHTHALMIC | Status: DC
  Administered 2016-08-18 – 2016-08-20 (×4): 1 [drp] via OPHTHALMIC
  Filled 2016-08-18: qty 5

## 2016-08-18 NOTE — Progress Notes (Signed)
  Subjective:  Patient has no complaints. He has very good appetite. He denies abdominal pain. He has had some itching but not worse over the last week. He states MiraLAX is working.  Objective: Blood pressure 127/66, pulse 68, temperature 98.2 F (36.8 C), temperature source Oral, resp. rate 18, height 5\' 10"  (1.778 m), weight 174 lb (78.9 kg), SpO2 98 %. Patient is deeply jaundiced. Abdomen is soft and nontender without organomegaly or masses.  Labs/studies Results:   Recent Labs  08/17/16 0940 08/18/16 0610  WBC 7.3 6.7  HGB 13.3 13.3  HCT 38.4* 37.6*  PLT 179 174    BMET   Recent Labs  08/17/16 0940 08/18/16 0610  NA 135 136  K 3.7 3.4*  CL 95* 97*  CO2 29 30  GLUCOSE 112* 102*  BUN 17 13  CREATININE 0.78 0.70  CALCIUM 9.8 9.3    LFT   Recent Labs  08/17/16 0940 08/18/16 0610  PROT 7.0 6.3*  ALBUMIN 4.2 3.6  AST 248* 216*  ALT 631* 562*  ALKPHOS 195* 178*  BILITOT 13.6* 13.1*    PT/INR   Recent Labs  08/18/16 0610  LABPROT 14.2  INR 1.09    CA-19-9 is pending  Assessment:  #1. Obstructive jaundice secondary to pancreatic head mass concerning for pancreatic adenocarcinoma. Patient is pain-free. Patient's condition discussed with the son bili yesterday. Given patient's age he is extremely high risk for curative procedure but whether or not this mass is resectable has not been determined. I would proceed with wall stenting. Patient and his wife are agreeable. #2. Chronic atrial fibrillation. Xarelto is on hold. Patient is on subcutaneous heparin.   Recommendations:  Hold subcutaneous heparin after next dose. RCP with biliary stenting.

## 2016-08-18 NOTE — Progress Notes (Signed)
PROGRESS NOTE    Zachary Arellano  WVP:[id number] DOB: 1931-12-09 DOA: 08/17/2016 PCP: Celene Squibb, MD    Brief Narrative:  83 year old male was admitted to the hospital with 2-3 days of painless jaundice. Imaging indicated dilated bile ducts and possible pancreatic mass. He was being admitted for ERCP and biliary stent placement.   Assessment & Plan:   Active Problems:   Atrial fibrillation ([facility])   Essential hypertension   Chronic diastolic CHF (congestive heart failure) ([facility])   Elevated LFTs   Pancreatic mass   Obstructive jaundice   1. Obstructive jaundice due to pancreatic mass. Findings worrisome for pancreatic adenocarcinoma. Gastroenterology is following. Plans are for biliary stent placement and ERCP on 5/7. Anticoagulation is currently on hold. There is also concerns for metastatic deposits in the liver. Brushings were pancreatic mass can likely be obtained during ERCP for diagnosis. 2. Chronic atrial fibrillation. Continue Coreg and diltiazem. Anticoagulation currently on hold in light of #1. 3. Chronic diastolic congestive heart failure. Appears compensated. Continue home dose of Lasix. 4. Hypertension. Continue on Coreg and diltiazem. Blood pressure stable.   DVT prophylaxis: heparin Code Status: DNR Family Communication: no family present Disposition Plan: discharge home once improved   Consultants:   gastroenterology  Procedures:     Antimicrobials:      Subjective: No abdominal pain, nausea or vomiting  Objective: Vitals:   08/17/16 1550 08/17/16 1742 08/17/16 2231 08/18/16 0549  BP:   131/78 127/66  Pulse:   63 68  Resp:   16 18  Temp:   98.7 F (37.1 C) 98.2 F (36.8 C)  TempSrc:   Oral Oral  SpO2:   97% 98%  Weight:  78.9 kg (174 lb)    Height: 5\' 10"  (1.778 m)       Intake/Output Summary (Last 24 hours) at 08/18/16 1239 Last data filed at 08/17/16 1900  Gross per 24 hour  Intake              240 ml  Output                0 ml  Net               240 ml   Filed Weights   08/17/16 0921 08/17/16 1742  Weight: 79.8 kg (175 lb 14.4 oz) 78.9 kg (174 lb)    Examination:  General exam: Appears calm and comfortable  Respiratory system: Clear to auscultation. Respiratory effort normal. Cardiovascular system: S1 & S2 heard, RRR. No JVD, murmurs, rubs, gallops or clicks. No pedal edema. Gastrointestinal system: Abdomen is nondistended, soft and nontender. No organomegaly or masses felt. Normal bowel sounds heard. Central nervous system: Alert and oriented. No focal neurological deficits. Extremities: Symmetric 5 x 5 power. Skin: skin is icteric Psychiatry: Judgement and insight appear normal. Mood & affect appropriate.     Data Reviewed: I have personally reviewed following labs and imaging studies  CBC:  Recent Labs Lab 08/17/16 0940 08/18/16 0610  WBC 7.3 6.7  NEUTROABS 5.5  --   HGB 13.3 13.3  HCT 38.4* 37.6*  MCV 91.2 92.4  PLT 179 546   Basic Metabolic Panel:  Recent Labs Lab 08/17/16 0940 08/18/16 0610  NA 135 136  K 3.7 3.4*  CL 95* 97*  CO2 29 30  GLUCOSE 112* 102*  BUN 17 13  CREATININE 0.78 0.70  CALCIUM 9.8 9.3   GFR: Estimated Creatinine Clearance: 72.2 mL/min (by C-G formula based on SCr of 0.7  mg/dL). Liver Function Tests:  Recent Labs Lab 08/17/16 0940 08/18/16 0610  AST 248* 216*  ALT 631* 562*  ALKPHOS 195* 178*  BILITOT 13.6* 13.1*  PROT 7.0 6.3*  ALBUMIN 4.2 3.6    Recent Labs Lab 08/17/16 0940  LIPASE 39   No results for input(s): AMMONIA in the last 168 hours. Coagulation Profile:  Recent Labs Lab 08/18/16 0610  INR 1.09   Cardiac Enzymes: No results for input(s): CKTOTAL, CKMB, CKMBINDEX, TROPONINI in the last 168 hours. BNP (last 3 results) No results for input(s): PROBNP in the last 8760 hours. HbA1C: No results for input(s): HGBA1C in the last 72 hours. CBG: No results for input(s): GLUCAP in the last 168 hours. Lipid Profile: No results for  input(s): CHOL, HDL, LDLCALC, TRIG, CHOLHDL, LDLDIRECT in the last 72 hours. Thyroid Function Tests: No results for input(s): TSH, T4TOTAL, FREET4, T3FREE, THYROIDAB in the last 72 hours. Anemia Panel: No results for input(s): VITAMINB12, FOLATE, FERRITIN, TIBC, IRON, RETICCTPCT in the last 72 hours. Sepsis Labs: No results for input(s): PROCALCITON, LATICACIDVEN in the last 168 hours.  No results found for this or any previous visit (from the past 240 hour(s)).       Radiology Studies: Ct Abdomen Pelvis W Contrast  Result Date: 08/17/2016 CLINICAL DATA:  Painless jaundice for 3 days. History of atrial fibrillation, hypertension and stroke. EXAM: CT ABDOMEN AND PELVIS WITH CONTRAST TECHNIQUE: Multidetector CT imaging of the abdomen and pelvis was performed using the standard protocol following bolus administration of intravenous contrast. CONTRAST:  162mL ISOVUE-300 IOPAMIDOL (ISOVUE-300) INJECTION 61% COMPARISON:  CT 11/18/2015.  MRI 12/14/2015. FINDINGS: Lower chest: The heart is enlarged. The lung bases are clear. There is no pleural or pericardial effusion. Hepatobiliary: There is new intrahepatic and extrahepatic biliary dilatation. The common hepatic duct is dilated to up to 1.5 cm and demonstrates abrupt change in caliber at the level of the superior pancreatic head. There are multiple ill-defined low-density hepatic lesions which are not clearly seen on prior studies, worrisome for metastatic disease. These include a 10 mm lesion posteriorly in the right lobe on image 21, a 7 mm lesion on image 23 and an 11 mm image on 26 (both near the origin of the caudate lobe), at a 9 mm lesion inferiorly on image number 30. The gallbladder is decompressed. There is some increased density within the gallbladder wall without surrounding inflammation. Pancreas: There is an ill-defined low-density area superiorly in the pancreatic head near the transition in the caliber of the common bile duct, worrisome for  a pancreatic malignancy. This measures approximately 15 x 17 mm on axial image 29 of series 2. The pancreatic duct is not significantly dilated. There is no surrounding inflammation. Spleen: Normal in size without focal abnormality. Adrenals/Urinary Tract: Both adrenal glands appear normal. Stable left renal cysts. In addition, there is a stable 10 mm lesion posteriorly in the mid left kidney (image 30), corresponding with the indeterminate lesion on previous studies. No evidence of urinary tract calculus or hydronephrosis. The bladder appears unremarkable. Stomach/Bowel: No evidence of bowel wall thickening, distention or surrounding inflammatory change. Vascular/Lymphatic: There are no enlarged abdominal or pelvic lymph nodes. There is diffuse aortic and branch vessel atherosclerosis. The suspected pancreatic mass abuts the undersurface of the main portal vein, although does not encase it. There is no arterial encasement or large vessel occlusion. Reproductive: The prostate gland and seminal vesicles appear normal. There is soft tissue density in the right inguinal canal which may reflect  retraction of the right testis or a small hydrocele. Other: No ascites or peritoneal nodularity. Musculoskeletal: No acute or significant osseous findings. Multilevel spondylosis noted associated with a convex left scoliosis. IMPRESSION: 1. Intrahepatic and extrahepatic biliary dilatation with abrupt transition in duct caliber at the level of the pancreatic head adjacent to a hypodensity. Findings are highly worrisome for pancreatic adenocarcinoma. 2. New low-density hepatic lesions, worrisome for metastatic disease. 3. Previously described left renal lesions are stable. 4. Aortic atherosclerosis. No evidence of tumor vascular encasement. 5. GI consultation for ERCP, stenting and tissue sampling recommended. Electronically Signed   By: Richardean Sale M.D.   On: 08/17/2016 11:53        Scheduled Meds: . brimonidine  1  drop Both Eyes BID   And  . timolol  1 drop Both Eyes BID  . carvedilol  25 mg Oral BID WC  . diltiazem  120 mg Oral Daily  . docusate sodium  100 mg Oral Daily  . furosemide  40 mg Oral Daily  . heparin  5,000 Units Subcutaneous Q8H  . latanoprost  1 drop Both Eyes QHS  . polyethylene glycol  17 g Oral Daily  . polyethylene glycol  17 g Oral Daily  . potassium chloride  10 mEq Oral Daily  . prednisoLONE acetate  1 drop Both Eyes QID  . sodium chloride flush  3 mL Intravenous Q12H   Continuous Infusions: . sodium chloride       LOS: 1 day    Time spent: 68mins    MEMON,JEHANZEB, MD [facility] Pager [phone]  If 7PM-7AM, please contact night-coverage [email or web address] Password [facility] 08/18/2016, 12:39 PM

## 2016-08-19 ENCOUNTER — Encounter (HOSPITAL_COMMUNITY): Payer: Self-pay | Admitting: *Deleted

## 2016-08-19 ENCOUNTER — Encounter (HOSPITAL_COMMUNITY)
Admission: EM | Disposition: A | Discharge status: Home / Self Care | Payer: Self-pay | Source: Home / Self Care | Attending: Internal Medicine

## 2016-08-19 ENCOUNTER — Inpatient Hospital Stay (HOSPITAL_COMMUNITY): Payer: Medicare Other

## 2016-08-19 ENCOUNTER — Inpatient Hospital Stay (HOSPITAL_COMMUNITY): Payer: Medicare Other | Admitting: Anesthesiology

## 2016-08-19 DIAGNOSIS — D490 Neoplasm of unspecified behavior of digestive system: Secondary | ICD-10-CM

## 2016-08-19 DIAGNOSIS — R17 Unspecified jaundice: Secondary | ICD-10-CM

## 2016-08-19 DIAGNOSIS — K831 Obstruction of bile duct: Secondary | ICD-10-CM

## 2016-08-19 DIAGNOSIS — K8689 Other specified diseases of pancreas: Secondary | ICD-10-CM

## 2016-08-19 HISTORY — PX: SPHINCTEROTOMY: SHX5544

## 2016-08-19 HISTORY — PX: BILIARY STENT PLACEMENT: SHX5538

## 2016-08-19 HISTORY — PX: ERCP: SHX5425

## 2016-08-19 LAB — CANCER ANTIGEN 19-9: CA 19 9: 7397 U/mL — AB (ref 0–35)

## 2016-08-19 SURGERY — ERCP, WITH INTERVENTION IF INDICATED
Anesthesia: General

## 2016-08-19 MED ORDER — IOPAMIDOL (ISOVUE-M 300) INJECTION 61%
INTRAMUSCULAR | Status: DC | PRN
  Administered 2016-08-19: 50 mL

## 2016-08-19 MED ORDER — SODIUM CHLORIDE 0.9 % IV SOLN: INTRAVENOUS | Status: DC

## 2016-08-19 MED ORDER — PROPOFOL 10 MG/ML IV BOLUS
INTRAVENOUS | Status: DC | PRN
  Administered 2016-08-19: 90 mg via INTRAVENOUS
  Administered 2016-08-19: 40 mg via INTRAVENOUS

## 2016-08-19 MED ORDER — MIDAZOLAM HCL 2 MG/2ML IJ SOLN
1.0000 mg | INTRAMUSCULAR | Status: DC
  Administered 2016-08-19: 2 mg via INTRAVENOUS

## 2016-08-19 MED ORDER — SODIUM CHLORIDE 0.9 % IJ SOLN
INTRAMUSCULAR | Status: AC
Start: 2016-08-19 — End: 2016-08-19
  Filled 2016-08-19: qty 10

## 2016-08-19 MED ORDER — GLUCAGON HCL RDNA (DIAGNOSTIC) 1 MG IJ SOLR
INTRAMUSCULAR | Status: DC | PRN
  Administered 2016-08-19 (×2): 0.25 mg via INTRAVENOUS

## 2016-08-19 MED ORDER — LIDOCAINE VISCOUS 2 % MT SOLN
OROMUCOSAL | Status: AC
  Filled 2016-08-19: qty 15

## 2016-08-19 MED ORDER — PROPOFOL 10 MG/ML IV BOLUS
INTRAVENOUS | Status: AC
  Filled 2016-08-19: qty 20

## 2016-08-19 MED ORDER — EPHEDRINE SULFATE 50 MG/ML IJ SOLN
INTRAMUSCULAR | Status: AC
  Filled 2016-08-19: qty 1

## 2016-08-19 MED ORDER — GLUCAGON HCL RDNA (DIAGNOSTIC) 1 MG IJ SOLR
INTRAMUSCULAR | Status: AC
  Filled 2016-08-19: qty 2

## 2016-08-19 MED ORDER — SODIUM CHLORIDE 0.9% FLUSH
INTRAVENOUS | Status: AC
  Filled 2016-08-19: qty 10

## 2016-08-19 MED ORDER — NEOSTIGMINE METHYLSULFATE 10 MG/10ML IV SOLN
INTRAVENOUS | Status: DC | PRN
  Administered 2016-08-19: 2 mg via INTRAVENOUS

## 2016-08-19 MED ORDER — CEFAZOLIN SODIUM-DEXTROSE 2-4 GM/100ML-% IV SOLN
2.0000 g | INTRAVENOUS | Status: AC
  Administered 2016-08-19: 2 g via INTRAVENOUS
  Filled 2016-08-19: qty 100

## 2016-08-19 MED ORDER — GLYCOPYRROLATE 0.2 MG/ML IJ SOLN
INTRAMUSCULAR | Status: AC
  Filled 2016-08-19: qty 2

## 2016-08-19 MED ORDER — LIDOCAINE HCL (PF) 1 % IJ SOLN
INTRAMUSCULAR | Status: AC
  Filled 2016-08-19: qty 5

## 2016-08-19 MED ORDER — ROCURONIUM BROMIDE 100 MG/10ML IV SOLN
INTRAVENOUS | Status: DC | PRN
  Administered 2016-08-19: 30 mg via INTRAVENOUS

## 2016-08-19 MED ORDER — METOPROLOL TARTRATE 5 MG/5ML IV SOLN
INTRAVENOUS | Status: AC
  Filled 2016-08-19: qty 5

## 2016-08-19 MED ORDER — EPHEDRINE SULFATE 50 MG/ML IJ SOLN
INTRAMUSCULAR | Status: DC | PRN
  Administered 2016-08-19: 5 mg via INTRAVENOUS

## 2016-08-19 MED ORDER — LIDOCAINE HCL (CARDIAC) 10 MG/ML IV SOLN
INTRAVENOUS | Status: DC | PRN
  Administered 2016-08-19: 50 mg via INTRAVENOUS

## 2016-08-19 MED ORDER — MIDAZOLAM HCL 2 MG/2ML IJ SOLN
INTRAMUSCULAR | Status: AC
  Filled 2016-08-19: qty 2

## 2016-08-19 MED ORDER — SUCCINYLCHOLINE CHLORIDE 20 MG/ML IJ SOLN
INTRAMUSCULAR | Status: AC
  Filled 2016-08-19: qty 1

## 2016-08-19 MED ORDER — FENTANYL CITRATE (PF) 100 MCG/2ML IJ SOLN
INTRAMUSCULAR | Status: DC | PRN
  Administered 2016-08-19 (×2): 25 ug via INTRAVENOUS

## 2016-08-19 MED ORDER — GLYCOPYRROLATE 0.2 MG/ML IJ SOLN
INTRAMUSCULAR | Status: DC | PRN
  Administered 2016-08-19: 0.4 mg via INTRAVENOUS

## 2016-08-19 MED ORDER — CEFAZOLIN SODIUM-DEXTROSE 2-4 GM/100ML-% IV SOLN
INTRAVENOUS | Status: AC
  Filled 2016-08-19: qty 100

## 2016-08-19 MED ORDER — METOPROLOL TARTRATE 5 MG/5ML IV SOLN
5.0000 mg | INTRAVENOUS | Status: DC | PRN
  Administered 2016-08-19 (×2): 1 mg via INTRAVENOUS

## 2016-08-19 MED ORDER — FENTANYL CITRATE (PF) 100 MCG/2ML IJ SOLN
INTRAMUSCULAR | Status: AC
  Filled 2016-08-19: qty 2

## 2016-08-19 MED ORDER — LACTATED RINGERS IV SOLN
INTRAVENOUS | Status: DC
  Administered 2016-08-19: 11:00:00 via INTRAVENOUS

## 2016-08-19 MED ORDER — FENTANYL CITRATE (PF) 100 MCG/2ML IJ SOLN: 25.0000 ug | INTRAMUSCULAR | Status: DC | PRN

## 2016-08-19 MED ORDER — PHENYLEPHRINE HCL 10 MG/ML IJ SOLN
INTRAMUSCULAR | Status: DC | PRN
  Administered 2016-08-19 (×5): 40 ug via INTRAVENOUS

## 2016-08-19 MED ORDER — IOPAMIDOL (ISOVUE-300) INJECTION 61%
INTRAVENOUS | Status: AC
  Filled 2016-08-19: qty 100

## 2016-08-19 MED ORDER — STERILE WATER FOR IRRIGATION IR SOLN
Status: DC | PRN
  Administered 2016-08-19: 1000 mL

## 2016-08-19 NOTE — Progress Notes (Signed)
Brief ERCP note;  Normal ampulla of Vater. Pancreatic duct cut off. Normal distal CBD with high-grade stricture and markedly dilated CHD and intrahepatic biliary radicles. Sphincterotomy performed. Brushing cytology taken. Full he covered 10 x 80 mm biliary stent place for decompression.  Anesthesia staff informed me that one of patient's dental implant came of; it was saved and will be given to patient and/or family member

## 2016-08-19 NOTE — Anesthesia Procedure Notes (Signed)
Procedure Name: Intubation Performed by: Vista Deck Pre-anesthesia Checklist: Patient identified, Patient being monitored, Timeout performed, Emergency Drugs available and Suction available Patient Re-evaluated:Patient Re-evaluated prior to inductionOxygen Delivery Method: Circle System Utilized Preoxygenation: Pre-oxygenation with 100% oxygen Intubation Type: IV induction Ventilation: Mask ventilation without difficulty Laryngoscope Size: Mac and 3 Grade View: Grade I Tube type: Oral Tube size: 7.0 mm Number of attempts: 1 Airway Equipment and Method: Stylet and Oral airway Placement Confirmation: ETT inserted through vocal cords under direct vision,  positive ETCO2 and breath sounds checked- equal and bilateral Secured at: 22 cm Tube secured with: Tape Dental Injury: Teeth and Oropharynx as per pre-operative assessment

## 2016-08-19 NOTE — Anesthesia Postprocedure Evaluation (Signed)
Anesthesia Post Note  Patient: Zachary Arellano  Procedure(s) Performed: Procedure(s) (LRB): ENDOSCOPIC RETROGRADE CHOLANGIOPANCREATOGRAPHY (ERCP) (N/A) BILIARY STENT PLACEMENT (N/A) SPHINCTEROTOMY  Anesthesia Type: General Comments: Right upper cap in container with patient on discharge from PACU and transport to room 310.     Last Vitals:  Vitals:   08/19/16 1415 08/19/16 1428  BP: 114/67 109/82  Pulse: 66 78  Resp: 19 19  Temp:      Last Pain:  Vitals:   08/19/16 1043  TempSrc: Oral  PainSc: 0-No pain                 Derrek Puff

## 2016-08-19 NOTE — Anesthesia Postprocedure Evaluation (Signed)
Anesthesia Post Note  Patient: Zachary Arellano  Procedure(s) Performed: Procedure(s) (LRB): ENDOSCOPIC RETROGRADE CHOLANGIOPANCREATOGRAPHY (ERCP) (N/A) BILIARY STENT PLACEMENT (N/A) SPHINCTEROTOMY  Patient location during evaluation: PACU Anesthesia Type: General Level of consciousness: awake and alert Pain management: satisfactory to patient Vital Signs Assessment: post-procedure vital signs reviewed and stable Respiratory status: spontaneous breathing Cardiovascular status: stable Anesthetic complications: no     Last Vitals:  Vitals:   08/19/16 1400 08/19/16 1415  BP: 124/81 114/67  Pulse: 91 66  Resp: (!) 21 19  Temp:      Last Pain:  Vitals:   08/19/16 1043  TempSrc: Oral  PainSc: 0-No pain                 Ritisha Deitrick

## 2016-08-19 NOTE — Progress Notes (Signed)
PROGRESS NOTE    Zachary Arellano  ZYS:[id number] DOB: 1931/05/21 DOA: 08/17/2016 PCP: Celene Squibb, MD    Brief Narrative:  83 year old male was admitted to the hospital with 2-3 days of painless jaundice. Imaging indicated dilated bile ducts and possible pancreatic mass. He was being admitted for ERCP and biliary stent placement.   Assessment & Plan:   Active Problems:   Atrial fibrillation (HCC)   Essential hypertension   Chronic diastolic CHF (congestive heart failure) (HCC)   Elevated LFTs   Pancreatic mass   Obstructive jaundice   1. Obstructive jaundice due to pancreatic mass. Findings worrisome for pancreatic adenocarcinoma. Gastroenterology is following. Patient underwent ERCP with biliary stent placement and sphincterotomy today. Anticoagulation is currently on hold. There is also concerns for metastatic deposits in the liver. CEA 19-9 is markedly elevated at 7394. Brushing cytology taken during the procedure. He will likely need follow-up with oncology. Repeat labs ordered for the morning. 2. Chronic atrial fibrillation. Continue Coreg and diltiazem. Anticoagulation currently on hold in light of #1. 3. Chronic diastolic congestive heart failure. Appears compensated. Continue home dose of Lasix. 4. Hypertension. Continue on Coreg and diltiazem. Blood pressure stable.   DVT prophylaxis: heparin Code Status: DNR Family Communication: no family present Disposition Plan: discharge home once improved, likely in a.m.   Consultants:   gastroenterology  Procedures:   5/7 ERCP with biliary stent placement and sphincterotomy  Antimicrobials:      Subjective: No vomiting, no abdominal pain  Objective: Vitals:   08/19/16 1358 08/19/16 1400 08/19/16 1415 08/19/16 1428  BP: 112/87 124/81 114/67 109/82  Pulse:  91 66 78  Resp: 18 (!) 21 19 19   Temp: 97.9 F (36.6 C)     TempSrc:      SpO2:  97% 99% 96%  Weight:      Height:        Intake/Output Summary (Last 24  hours) at 08/19/16 1633 Last data filed at 08/19/16 1321  Gross per 24 hour  Intake              600 ml  Output                0 ml  Net              600 ml   Filed Weights   08/17/16 0921 08/17/16 1742  Weight: 79.8 kg (175 lb 14.4 oz) 78.9 kg (174 lb)    Examination:  General exam: Alert, awake, oriented x 3 Respiratory system: Clear to auscultation. Respiratory effort normal. Cardiovascular system:RRR. No murmurs, rubs, gallops. Gastrointestinal system: Abdomen is nondistended, soft and nontender. No organomegaly or masses felt. Normal bowel sounds heard. Central nervous system: Alert and oriented. No focal neurological deficits. Extremities: No C/C/E, +pedal pulses Skin: No rashes, lesions or ulcers Psychiatry: Judgement and insight appear normal. Mood & affect appropriate.    Data Reviewed: I have personally reviewed following labs and imaging studies  CBC:  Recent Labs Lab 08/17/16 0940 08/18/16 0610  WBC 7.3 6.7  NEUTROABS 5.5  --   HGB 13.3 13.3  HCT 38.4* 37.6*  MCV 91.2 92.4  PLT 179 932   Basic Metabolic Panel:  Recent Labs Lab 08/17/16 0940 08/18/16 0610  NA 135 136  K 3.7 3.4*  CL 95* 97*  CO2 29 30  GLUCOSE 112* 102*  BUN 17 13  CREATININE 0.78 0.70  CALCIUM 9.8 9.3   GFR: Estimated Creatinine Clearance: 72.2 mL/min (by C-G formula based  on SCr of 0.7 mg/dL). Liver Function Tests:  Recent Labs Lab 08/17/16 0940 08/18/16 0610  AST 248* 216*  ALT 631* 562*  ALKPHOS 195* 178*  BILITOT 13.6* 13.1*  PROT 7.0 6.3*  ALBUMIN 4.2 3.6    Recent Labs Lab 08/17/16 0940  LIPASE 39   No results for input(s): AMMONIA in the last 168 hours. Coagulation Profile:  Recent Labs Lab 08/18/16 0610  INR 1.09   Cardiac Enzymes: No results for input(s): CKTOTAL, CKMB, CKMBINDEX, TROPONINI in the last 168 hours. BNP (last 3 results) No results for input(s): PROBNP in the last 8760 hours. HbA1C: No results for input(s): HGBA1C in the last 72  hours. CBG: No results for input(s): GLUCAP in the last 168 hours. Lipid Profile: No results for input(s): CHOL, HDL, LDLCALC, TRIG, CHOLHDL, LDLDIRECT in the last 72 hours. Thyroid Function Tests: No results for input(s): TSH, T4TOTAL, FREET4, T3FREE, THYROIDAB in the last 72 hours. Anemia Panel: No results for input(s): VITAMINB12, FOLATE, FERRITIN, TIBC, IRON, RETICCTPCT in the last 72 hours. Sepsis Labs: No results for input(s): PROCALCITON, LATICACIDVEN in the last 168 hours.  No results found for this or any previous visit (from the past 240 hour(s)).       Radiology Studies: Dg Ercp Biliary & Pancreatic Ducts  Result Date: 08/19/2016 CLINICAL DATA:  Pancreatic head mass, obstructive jaundice EXAM: ERCP with CBD metallic stent insertion TECHNIQUE: Multiple spot images obtained with the fluoroscopic device and submitted for interpretation post-procedure. FLUOROSCOPY TIME:  Fluoroscopy Time:  5 minutes 11 seconds COMPARISON:  08/17/2016 CT FINDINGS: Several spot fluoroscopic intraoperative views during the ERCP procedure. Retrograde cholangiogram demonstrates lower common bile duct obstruction. This was successfully traversed with injection of the more proximal CBD. This demonstrates marked proximal biliary tree dilatation. Successful deployment of a wall stent across the distal CBD obstruction. IMPRESSION: Distal CBD obstruction with insertion of a common bile duct stent successfully. These images were submitted for radiologic interpretation only. Please see the procedural report for the amount of contrast and the fluoroscopy time utilized. Electronically Signed   By: Jerilynn Mages.  Shick M.D.   On: 08/19/2016 14:14        Scheduled Meds: . brimonidine  1 drop Both Eyes BID   And  . timolol  1 drop Both Eyes BID  . carvedilol  25 mg Oral BID WC  . diltiazem  120 mg Oral Daily  . docusate sodium  100 mg Oral Daily  . furosemide  40 mg Oral Daily  . iopamidol      . latanoprost  1 drop  Both Eyes QHS  . polyethylene glycol  17 g Oral Daily  . polyethylene glycol  17 g Oral Daily  . potassium chloride  10 mEq Oral Daily  . prednisoLONE acetate  1 drop Both Eyes QID  . sodium chloride flush  3 mL Intravenous Q12H   Continuous Infusions: . sodium chloride       LOS: 2 days    Time spent: 41mins    MEMON,JEHANZEB, MD [facility] Pager [phone]  If 7PM-7AM, please contact night-coverage [email or web address] Password [facility] 08/19/2016, 4:33 PM

## 2016-08-19 NOTE — Addendum Note (Signed)
Addendum  created 08/19/16 1551 by Vista Deck, CRNA   Anesthesia Event edited

## 2016-08-19 NOTE — Op Note (Signed)
[facility] Patient Name: Zachary Arellano Procedure Date: 08/19/2016 12:05 PM MRN: [id number] Date of Birth: [birth year] Attending MD: Hildred Laser , MD CSN: [id number] Age: 83 Admit Type: Inpatient Procedure:                ERCP Indications:              Jaundice, Tumor of the head of pancreas Providers:                Hildred Laser, MD, Janeece Riggers, RN, Aram Candela Referring MD:             Kathie Dike, MD Medicines:                General Anesthesia Complications:            No immediate complications. Estimated Blood Loss:     Estimated blood loss was minimal. Procedure:                Pre-Anesthesia Assessment:                           - Prior to the procedure, a History and Physical                            was performed, and patient medications and                            allergies were reviewed. The patient's tolerance of                            previous anesthesia was also reviewed. The risks                            and benefits of the procedure and the sedation                            options and risks were discussed with the patient.                            All questions were answered, and informed consent                            was obtained. Prior Anticoagulants: The patient                            last took heparin 1 day and Xarelto (rivaroxaban) 3                            days prior to the procedure. ASA Grade Assessment:                            III - A patient with severe systemic disease. After                            reviewing the risks and benefits, the patient was  deemed in satisfactory condition to undergo the                            procedure.                           After obtaining informed consent, the scope was                            passed under direct vision. Throughout the                            procedure, the patient's blood pressure, pulse, and                            oxygen  saturations were monitored continuously. The                            [id number] ([id number]) scope was introduced through                            the mouth, and used to inject contrast into and                            used to cannulate the bile duct. The ERCP was                            accomplished without difficulty. The patient                            tolerated the procedure well. Scope In: 1:04:49 PM Scope Out: 1:39:15 PM Total Procedure Duration: 0 hours 34 minutes 26 seconds  Findings:      The scout film was normal. The esophagus was successfully intubated       under direct vision. The scope was advanced to a normal major papilla in       the descending duodenum without detailed examination of the pharynx,       larynx and associated structures, and upper GI tract. The upper GI tract       was grossly normal. The major papilla was normal. The ventral pancreatic       duct was deeply cannulated. Contrast was injected. I personally       interpreted the pancreatic duct images. Contrast extended to the distal       pancreatic duct. The ventral pancreatic duct in the head of the pancreas       was completely obstructed by what appeared to be a mass. 0.035 inch x       260 cm straight Hydra Jagwire passed successfully into the lower third       of the main bile duct and the left intrahepatic branches. The biliary       sphincterotomy was extended to a total of 8 mm in length with a braided       Autotome sphincterotome using ERBE electrocautery. The sphincterotomy       oozed blood. The common bile duct contained a single severe localized       stenosis  30 mm in length. The upper third of the main bile duct was       moderately dilated and diffusely dilated, with a mass causing an       obstruction. The largest diameter was 16 mm. Cells for cytology were       obtained by brushing. One 10 mm by 8 cm covered metal biliary stent was       placed 7 cm into the common bile duct.  Fluid flowed through the stent.       The stent was in good position. Impression:               - The major papilla appeared normal.                           - A severe localized biliary stricture was found.                            The stricture was malignant appearing.                           - The upper third of the main bile duct was                            moderately dilated, with a mass causing an                            obstruction.                           - A pancreatic obstruction secondary to what                            appeared to be a mass was found in the head of the                            pancreas.                           - A sphincterotomy was performed.                           - Brushing cytology taken frombile duct stricture.                           - One covered metal biliary stent was placed into                            the common bile duct. Lot # [id number] and reference                            [id number] Moderate Sedation:      Per Anesthesia Care Recommendation:           - Return patient to hospital ward for ongoing care.                           -  Clear liquid diet today.                           - Avoid aspirin, nonsteroidal anti-inflammatory                            medicines and anticoagulants for 3 days. Procedure Code(s):        --- Professional ---                           [phone], Endoscopic retrograde                            cholangiopancreatography (ERCP); with placement of                            endoscopic stent into biliary or pancreatic duct,                            including pre- and post-dilation and guide wire                            passage, when performed, including sphincterotomy,                            when performed, each stent Diagnosis Code(s):        --- Professional ---                           K83.1, Obstruction of bile duct                           K86.89, Other specified diseases of  pancreas                           R17, Unspecified jaundice                           D49.0, Neoplasm of unspecified behavior of                            digestive system CPT copyright 2016 [employer]. All rights reserved. The codes documented in this report are preliminary and upon coder review may  be revised to meet current compliance requirements. Hildred Laser, MD Hildred Laser, MD 08/19/2016 2:37:07 PM This report has been signed electronically. Number of Addenda: 0

## 2016-08-19 NOTE — Anesthesia Preprocedure Evaluation (Addendum)
Anesthesia Evaluation  Patient identified by MRN, date of birth, ID band Patient awake    Reviewed: Allergy & Precautions, NPO status , Patient's Chart, lab work & pertinent test results, reviewed documented beta blocker date and time   Airway Mallampati: I  TM Distance: >3 FB Neck ROM: Full    Dental  (+) Teeth Intact, Implants, Dental Advisory Given Extensive dental implants.:   Pulmonary former smoker,    breath sounds clear to auscultation       Cardiovascular hypertension, Pt. on medications and Pt. on home beta blockers +CHF  + dysrhythmias Atrial Fibrillation  Rhythm:Regular Rate:Normal     Neuro/Psych CVA, No Residual Symptoms    GI/Hepatic negative GI ROS, Biliary obstruction, pancreatitic mass with possible liver mets.   Endo/Other    Renal/GU      Musculoskeletal   Abdominal   Peds  Hematology   Anesthesia Other Findings   Reproductive/Obstetrics                            Anesthesia Physical Anesthesia Plan  ASA: IV  Anesthesia Plan: General   Post-op Pain Management:    Induction: Intravenous  Airway Management Planned: Oral ETT  Additional Equipment:   Intra-op Plan:   Post-operative Plan: Extubation in OR  Informed Consent: I have reviewed the patients History and Physical, chart, labs and discussed the procedure including the risks, benefits and alternatives for the proposed anesthesia with the patient or authorized representative who has indicated his/her understanding and acceptance.     Plan Discussed with:   Anesthesia Plan Comments: (Prone position)        Anesthesia Quick Evaluation

## 2016-08-19 NOTE — Transfer of Care (Addendum)
Immediate Anesthesia Transfer of Care Note  Patient: Primus B Wool  Procedure(s) Performed: Procedure(s): ENDOSCOPIC RETROGRADE CHOLANGIOPANCREATOGRAPHY (ERCP) (N/A) BILIARY STENT PLACEMENT (N/A) SPHINCTEROTOMY  Patient Location: PACU  Anesthesia Type:General  Level of Consciousness: awake and patient cooperative  Airway & Oxygen Therapy: Patient Spontanous Breathing and non-rebreather face mask  Post-op Assessment: Report given to RN and Post -op Vital signs reviewed and stable  Post vital signs: Reviewed and stable  Last Vitals:  Vitals:   08/19/16 1220 08/19/16 1225  BP: (!) 143/92 (!) 149/94  Pulse:    Resp: (!) 23 (!) 21  Temp:      Last Pain:  Vitals:   08/19/16 1043  TempSrc: Oral  PainSc: 0-No pain      Patients Stated Pain Goal: 5 (56/31/49 7026)  Complications: Right upper cap noted to be  on BluE ERCP Bite Block upon removal of block.

## 2016-08-20 LAB — HEPATIC FUNCTION PANEL
ALT: 375 U/L — ABNORMAL HIGH (ref 17–63)
AST: 120 U/L — ABNORMAL HIGH (ref 15–41)
Albumin: 3.2 g/dL — ABNORMAL LOW (ref 3.5–5.0)
Alkaline Phosphatase: 177 U/L — ABNORMAL HIGH (ref 38–126)
BILIRUBIN INDIRECT: 2.8 mg/dL — AB (ref 0.3–0.9)
Bilirubin, Direct: 2.7 mg/dL — ABNORMAL HIGH (ref 0.1–0.5)
TOTAL PROTEIN: 5.8 g/dL — AB (ref 6.5–8.1)
Total Bilirubin: 5.5 mg/dL — ABNORMAL HIGH (ref 0.3–1.2)

## 2016-08-20 MED ORDER — POTASSIUM CHLORIDE ER 10 MEQ PO TBCR: 10.0000 meq | EXTENDED_RELEASE_TABLET | Freq: Every day | ORAL | 3 refills | Status: AC

## 2016-08-20 MED ORDER — TRAZODONE HCL 100 MG PO TABS: 100.0000 mg | ORAL_TABLET | Freq: Every evening | ORAL | 0 refills | Status: AC | PRN

## 2016-08-20 MED ORDER — RIVAROXABAN 20 MG PO TABS: ORAL_TABLET | ORAL | 9 refills | Status: AC

## 2016-08-20 NOTE — Care Management Important Message (Signed)
Important Message  Patient Details  Name: Zachary Arellano MRN: [id number] Date of Birth: [birth year]   Medicare Important Message Given:  Yes    Sherald Barge, RN 08/20/2016, 9:30 AM

## 2016-08-20 NOTE — Care Management Note (Addendum)
Case Management Note  Patient Details  Name: Zachary Arellano MRN: [id number] Date of Birth: July 18, 1931  Subjective/Objective:                  Pt admitted with elevated LFT'S. No CM consult placed, chart reviewed for needs. Pt is from home, lives with wife and is ind with ADL's. He has strong family support from children. He's receiving no HH services at this time. He has PCP, transportation to appointments and insurance with drug coverage. Per MD no HH or DME needs.  Action/Plan: Pt discharging home today with self care.   Expected Discharge Date:  08/20/16               Expected Discharge Plan:  Home/Self Care  In-House Referral:  NA  Discharge planning Services  NA  Post Acute Care Choice:  NA Choice offered to:  NA  Status of Service:  Completed, signed off  Sherald Barge, RN 08/20/2016, 9:30 AM

## 2016-08-20 NOTE — Addendum Note (Signed)
Addendum  created 08/20/16 0750 by Charmaine Downs, CRNA   Sign clinical note

## 2016-08-20 NOTE — Progress Notes (Signed)
Pt IV removed, tolerated well.  Reviewed discharge instructions with pt and family at bedside.  Answered all questions at this time.   

## 2016-08-20 NOTE — Progress Notes (Signed)
  Subjective:  Patient has no complaints..He denies nausea vomiting abdominal pain. He has very good appetite.   Objective: Blood pressure (!) 142/81, pulse 88, temperature 97.8 F (36.6 C), temperature source Oral, resp. rate 18, height 5\' 10"  (1.778 m), weight 174 lb (78.9 kg), SpO2 97 %. Patient is alert. He is eating his breakfast. He appears less jaundiced today. Abdomen is symmetrical soft and nontender without organomegaly or masses. No LE edema or clubbing noted.  Labs/studies Results:   Recent Labs  08/17/16 0940 08/18/16 0610  WBC 7.3 6.7  HGB 13.3 13.3  HCT 38.4* 37.6*  PLT 179 174    BMET   Recent Labs  08/17/16 0940 08/18/16 0610  NA 135 136  K 3.7 3.4*  CL 95* 97*  CO2 29 30  GLUCOSE 112* 102*  BUN 17 13  CREATININE 0.78 0.70  CALCIUM 9.8 9.3    LFT   Recent Labs  08/17/16 0940 08/18/16 0610 08/20/16 0414  PROT 7.0 6.3* 5.8*  ALBUMIN 4.2 3.6 3.2*  AST 248* 216* 120*  ALT 631* 562* 375*  ALKPHOS 195* 178* 177*  BILITOT 13.6* 13.1* 5.5*  BILIDIR  --   --  2.7*  IBILI  --   --  2.8*    PT/INR   Recent Labs  08/18/16 0610  LABPROT 14.2  INR 1.09    CA 19-9        7397 (Normal 0-35 U/mL)  Bile duct cytology pending.  Assessment:  #1. Obstructive jaundice secondary to pancreatic head mass suspicious for pancreatic adenocarcinoma. He also has 4 small liver lesion suspicious for metastatic disease. He had biliary decompression yesterday afternoon. ERCP showed double duct sign which also points to pancreatic carcinoma as does markedly elevated CA-19-9. Significant improvement in cholestasis since biliary stenting. All studies reviewed with patient's son Vergia Alcon and daughter-in-law. Patient made aware of the diagnosis. Patient unfortunately not a candidate for resection.   #2.Chronic A. fib. Anticoagulant on hold.    Recommendations:  Hold anticoagulant until 08/22/2016. Patient stable for discharge. I will contact patient's son and  cytology results available and arrange for oncology consultation.

## 2016-08-20 NOTE — Anesthesia Postprocedure Evaluation (Signed)
Anesthesia Post Note  Patient: Zachary Arellano  Procedure(s) Performed: Procedure(s) (LRB): ENDOSCOPIC RETROGRADE CHOLANGIOPANCREATOGRAPHY (ERCP) (N/A) BILIARY STENT PLACEMENT (N/A) SPHINCTEROTOMY  Patient location during evaluation: Nursing Unit Anesthesia Type: General Level of consciousness: awake and alert, oriented and patient cooperative Pain management: pain level controlled Vital Signs Assessment: post-procedure vital signs reviewed and stable Respiratory status: spontaneous breathing, nonlabored ventilation and respiratory function stable Cardiovascular status: blood pressure returned to baseline Postop Assessment: no signs of nausea or vomiting Anesthetic complications: no     Last Vitals:  Vitals:   08/19/16 2042 08/20/16 0500  BP: 100/60 (!) 142/81  Pulse: 72 88  Resp: 18 18  Temp: 36.8 C 36.6 C    Last Pain:  Vitals:   08/20/16 0500  TempSrc: Oral  PainSc:                  Keevan Wolz J

## 2016-08-20 NOTE — Discharge Summary (Signed)
Physician Discharge Summary  Zachary Arellano:[id number] DOB: June 06, 1931 DOA: 08/17/2016  PCP: Celene Squibb, MD  Admit date: 08/17/2016 Discharge date: 08/20/2016  Admitted From: home Disposition:  home  Recommendations for Outpatient Follow-up:  1. Follow up with PCP in 1-2 weeks 2. Please obtain BMP/CBC and liver function tests in one week 3. Patient has been referred to oncology clinic 4. Follow up with gastroenterology for biopsy results  Discharge Condition: stable CODE STATUS: DNR Diet recommendation: Heart Healthy   Brief/Interim Summary: 83 year old male was admitted to the hospital with 2-3 days of painless jaundice. Imaging indicated dilated bile ducts and possible pancreatic mass. He was being admitted for ERCP and biliary stent placement.  Discharge Diagnoses:  Active Problems:   Atrial fibrillation (HCC)   Essential hypertension   Chronic diastolic CHF (congestive heart failure) (HCC)   Elevated LFTs   Pancreatic mass   Obstructive jaundice  1. Obstructive jaundice due to pancreatic mass. Findings worrisome for pancreatic adenocarcinoma. Gastroenterology followed in the hospital. Patient underwent ERCP with biliary stent placement and sphincterotomy. Anticoagulation was held for procedure. There is also concerns for metastatic deposits in the liver. CEA 19-9 is markedly elevated at 7394. Brushing cytology taken during the procedure will be followed up as outpatient. He he has been referred to oncology clinic. Follow up LFTs after procedure shows that bilirubin is trending down. 2. Chronic atrial fibrillation. Continue Coreg and diltiazem. Anticoagulation currently on hold in light of #1. Resume on 5/10 3. Chronic diastolic congestive heart failure. Appears compensated. Continue home dose of Lasix. 4. Hypertension. Continue on Coreg and diltiazem. Blood pressure stable.  Discharge Instructions  Discharge Instructions    Diet - low sodium heart healthy    Complete by:  As  directed    Increase activity slowly    Complete by:  As directed      Allergies as of 08/20/2016   No Known Allergies     Medication List    TAKE these medications   B-complex with vitamin C tablet Take 1 tablet by mouth daily.   bimatoprost 0.03 % ophthalmic solution Commonly known as:  LUMIGAN Place 1 drop into both eyes daily.   brimonidine 0.15 % ophthalmic solution Commonly known as:  ALPHAGAN Place 1 drop into the left eye 2 (two) times daily.   carvedilol 25 MG tablet Commonly known as:  COREG Take 1 tablet (25 mg total) by mouth 2 (two) times daily with a meal.   COMBIGAN 0.2-0.5 % ophthalmic solution Generic drug:  brimonidine-timolol Place 1 drop into both eyes 2 (two) times daily.   diltiazem 120 MG 24 hr capsule Commonly known as:  CARDIZEM CD Take 1 capsule (120 mg total) by mouth daily.   docusate sodium 100 MG capsule Commonly known as:  COLACE Take 1 capsule (100 mg total) by mouth daily.   furosemide 40 MG tablet Commonly known as:  LASIX Take 1 tablet (40 mg total) by mouth daily.   multivitamin with minerals Tabs tablet Take 1 tablet by mouth daily.   polyethylene glycol packet Commonly known as:  MIRALAX / GLYCOLAX Take 17 g by mouth daily.   potassium chloride 10 MEQ tablet Commonly known as:  K-DUR Take 1 tablet (10 mEq total) by mouth daily.   prednisoLONE acetate 1 % ophthalmic suspension Commonly known as:  PRED FORTE Place 1 drop into both eyes 4 (four) times daily.   rivaroxaban 20 MG Tabs tablet Commonly known as:  XARELTO TAKE ONE (1) TABLET  EACH DAY resume on 5/10 What changed:  See the new instructions.   traZODone 100 MG tablet Commonly known as:  DESYREL Take 1 tablet (100 mg total) by mouth at bedtime as needed for sleep.   VISION FORMULA EYE HEALTH Caps Take 1 capsule by mouth daily.       No Known Allergies  Consultations:  gastroenterology   Procedures/Studies: Ct Abdomen Pelvis W Contrast  Result  Date: 08/17/2016 CLINICAL DATA:  Painless jaundice for 3 days. History of atrial fibrillation, hypertension and stroke. EXAM: CT ABDOMEN AND PELVIS WITH CONTRAST TECHNIQUE: Multidetector CT imaging of the abdomen and pelvis was performed using the standard protocol following bolus administration of intravenous contrast. CONTRAST:  151mL ISOVUE-300 IOPAMIDOL (ISOVUE-300) INJECTION 61% COMPARISON:  CT 11/18/2015.  MRI 12/14/2015. FINDINGS: Lower chest: The heart is enlarged. The lung bases are clear. There is no pleural or pericardial effusion. Hepatobiliary: There is new intrahepatic and extrahepatic biliary dilatation. The common hepatic duct is dilated to up to 1.5 cm and demonstrates abrupt change in caliber at the level of the superior pancreatic head. There are multiple ill-defined low-density hepatic lesions which are not clearly seen on prior studies, worrisome for metastatic disease. These include a 10 mm lesion posteriorly in the right lobe on image 21, a 7 mm lesion on image 23 and an 11 mm image on 26 (both near the origin of the caudate lobe), at a 9 mm lesion inferiorly on image number 30. The gallbladder is decompressed. There is some increased density within the gallbladder wall without surrounding inflammation. Pancreas: There is an ill-defined low-density area superiorly in the pancreatic head near the transition in the caliber of the common bile duct, worrisome for a pancreatic malignancy. This measures approximately 15 x 17 mm on axial image 29 of series 2. The pancreatic duct is not significantly dilated. There is no surrounding inflammation. Spleen: Normal in size without focal abnormality. Adrenals/Urinary Tract: Both adrenal glands appear normal. Stable left renal cysts. In addition, there is a stable 10 mm lesion posteriorly in the mid left kidney (image 30), corresponding with the indeterminate lesion on previous studies. No evidence of urinary tract calculus or hydronephrosis. The bladder  appears unremarkable. Stomach/Bowel: No evidence of bowel wall thickening, distention or surrounding inflammatory change. Vascular/Lymphatic: There are no enlarged abdominal or pelvic lymph nodes. There is diffuse aortic and branch vessel atherosclerosis. The suspected pancreatic mass abuts the undersurface of the main portal vein, although does not encase it. There is no arterial encasement or large vessel occlusion. Reproductive: The prostate gland and seminal vesicles appear normal. There is soft tissue density in the right inguinal canal which may reflect retraction of the right testis or a small hydrocele. Other: No ascites or peritoneal nodularity. Musculoskeletal: No acute or significant osseous findings. Multilevel spondylosis noted associated with a convex left scoliosis. IMPRESSION: 1. Intrahepatic and extrahepatic biliary dilatation with abrupt transition in duct caliber at the level of the pancreatic head adjacent to a hypodensity. Findings are highly worrisome for pancreatic adenocarcinoma. 2. New low-density hepatic lesions, worrisome for metastatic disease. 3. Previously described left renal lesions are stable. 4. Aortic atherosclerosis. No evidence of tumor vascular encasement. 5. GI consultation for ERCP, stenting and tissue sampling recommended. Electronically Signed   By: Richardean Sale M.D.   On: 08/17/2016 11:53   Dg Ercp Biliary & Pancreatic Ducts  Result Date: 08/19/2016 CLINICAL DATA:  Pancreatic head mass, obstructive jaundice EXAM: ERCP with CBD metallic stent insertion TECHNIQUE: Multiple spot images obtained  with the fluoroscopic device and submitted for interpretation post-procedure. FLUOROSCOPY TIME:  Fluoroscopy Time:  5 minutes 11 seconds COMPARISON:  08/17/2016 CT FINDINGS: Several spot fluoroscopic intraoperative views during the ERCP procedure. Retrograde cholangiogram demonstrates lower common bile duct obstruction. This was successfully traversed with injection of the more  proximal CBD. This demonstrates marked proximal biliary tree dilatation. Successful deployment of a wall stent across the distal CBD obstruction. IMPRESSION: Distal CBD obstruction with insertion of a common bile duct stent successfully. These images were submitted for radiologic interpretation only. Please see the procedural report for the amount of contrast and the fluoroscopy time utilized. Electronically Signed   By: Jerilynn Mages.  Shick M.D.   On: 08/19/2016 14:14     5/7 ERCP with biliary stent placement and sphincterotomy   Subjective: Feeling better. No abdominal pain or vomiting  Discharge Exam: Vitals:   08/19/16 2042 08/20/16 0500  BP: 100/60 (!) 142/81  Pulse: 72 88  Resp: 18 18  Temp: 98.2 F (36.8 C) 97.8 F (36.6 C)   Vitals:   08/19/16 1415 08/19/16 1428 08/19/16 2042 08/20/16 0500  BP: 114/67 109/82 100/60 (!) 142/81  Pulse: 66 78 72 88  Resp: 19 19 18 18   Temp:   98.2 F (36.8 C) 97.8 F (36.6 C)  TempSrc:   Oral Oral  SpO2: 99% 96% 95% 97%  Weight:      Height:        General: Pt is alert, awake, not in acute distress, jaundice improving Cardiovascular: RRR, S1/S2 +, no rubs, no gallops Respiratory: CTA bilaterally, no wheezing, no rhonchi Abdominal: Soft, NT, ND, bowel sounds + Extremities: no edema, no cyanosis    The results of significant diagnostics from this hospitalization (including imaging, microbiology, ancillary and laboratory) are listed below for reference.     Microbiology: No results found for this or any previous visit (from the past 240 hour(s)).   Labs: BNP (last 3 results)  Recent Labs  11/02/15 0447  BNP 270.6*   Basic Metabolic Panel:  Recent Labs Lab 08/17/16 0940 08/18/16 0610  NA 135 136  K 3.7 3.4*  CL 95* 97*  CO2 29 30  GLUCOSE 112* 102*  BUN 17 13  CREATININE 0.78 0.70  CALCIUM 9.8 9.3   Liver Function Tests:  Recent Labs Lab 08/17/16 0940 08/18/16 0610 08/20/16 0414  AST 248* 216* 120*  ALT 631* 562*  375*  ALKPHOS 195* 178* 177*  BILITOT 13.6* 13.1* 5.5*  PROT 7.0 6.3* 5.8*  ALBUMIN 4.2 3.6 3.2*    Recent Labs Lab 08/17/16 0940  LIPASE 39   No results for input(s): AMMONIA in the last 168 hours. CBC:  Recent Labs Lab 08/17/16 0940 08/18/16 0610  WBC 7.3 6.7  NEUTROABS 5.5  --   HGB 13.3 13.3  HCT 38.4* 37.6*  MCV 91.2 92.4  PLT 179 174   Cardiac Enzymes: No results for input(s): CKTOTAL, CKMB, CKMBINDEX, TROPONINI in the last 168 hours. BNP: Invalid input(s): POCBNP CBG: No results for input(s): GLUCAP in the last 168 hours. D-Dimer No results for input(s): DDIMER in the last 72 hours. Hgb A1c No results for input(s): HGBA1C in the last 72 hours. Lipid Profile No results for input(s): CHOL, HDL, LDLCALC, TRIG, CHOLHDL, LDLDIRECT in the last 72 hours. Thyroid function studies No results for input(s): TSH, T4TOTAL, T3FREE, THYROIDAB in the last 72 hours.  Invalid input(s): FREET3 Anemia work up No results for input(s): VITAMINB12, FOLATE, FERRITIN, TIBC, IRON, RETICCTPCT in the last 33  hours. Urinalysis    Component Value Date/Time   COLORURINE AMBER (A) 08/17/2016 0940   APPEARANCEUR CLEAR 08/17/2016 0940   LABSPEC 1.005 08/17/2016 0940   PHURINE 7.0 08/17/2016 0940   GLUCOSEU NEGATIVE 08/17/2016 0940   HGBUR NEGATIVE 08/17/2016 0940   BILIRUBINUR NEGATIVE 08/17/2016 0940   KETONESUR NEGATIVE 08/17/2016 0940   PROTEINUR NEGATIVE 08/17/2016 0940   NITRITE NEGATIVE 08/17/2016 0940   LEUKOCYTESUR NEGATIVE 08/17/2016 0940   Sepsis Labs Invalid input(s): PROCALCITONIN,  WBC,  LACTICIDVEN Microbiology No results found for this or any previous visit (from the past 240 hour(s)).   Time coordinating discharge: Over 30 minutes  SIGNED:   Kathie Dike, MD  [facility] 08/20/2016, 9:13 AM Pager   If 7PM-7AM, please contact night-coverage [email or web address] Password [email or web address]

## 2016-08-21 ENCOUNTER — Encounter (HOSPITAL_COMMUNITY): Payer: Self-pay | Admitting: Internal Medicine

## 2016-08-21 NOTE — Progress Notes (Signed)
Cardiology Office Note  Date: 08/22/2016   ID: Zachary Arellano, DOB August 07, 1931, MRN [id number]  PCP: Zachary Squibb, MD  Primary Cardiologist: Zachary Lesches, MD   Chief Complaint  Patient presents with  . Atrial Fibrillation    History of Present Illness: Zachary Arellano is an 83 y.o. male last seen in the office back in January. I reviewed interval records, he was just recently hospitalized, discharged from [facility] on May 8. He presented with painless jaundice and was found to have a pancreatic mass concerning for adenocarcinoma. Patient underwent gastroenterology evaluation with ERCP including biliary stent placement and sphincterotomy. Anticoagulation was held for this intervention. He has a substantially elevated CEA 19-9 and has been referred to Oncology. No reported decompensation in cardiac status based on discharge summary.  He is here today with his son for a follow-up visit. States that he feels reasonably well, stable appetite. Jaundice has improved significantly. He is being evaluated in the Oncology clinic next week.  I reviewed his medications, he plans to start back on Xarelto today, continues on Cardizem CD and Coreg with good heart rate control of atrial fibrillation. He has had no significant leg edema on diuretics.  Past Medical History:  Diagnosis Date  . Chronic atrial fibrillation ([place])   . Essential hypertension   . Glaucoma   . History of stroke    Left cerebellum.  . Rib fracture     Past Surgical History:  Procedure Laterality Date  . BILIARY STENT PLACEMENT N/A 08/19/2016   Procedure: BILIARY STENT PLACEMENT;  Surgeon: Zachary Houston, MD;  [facility];  Service: Endoscopy;  Laterality: N/A;  . CATARACT EXTRACTION    . ERCP N/A 08/19/2016   Procedure: ENDOSCOPIC RETROGRADE CHOLANGIOPANCREATOGRAPHY (ERCP);  Surgeon: Zachary Houston, MD;  Location: [facility];  Service: Endoscopy;  Laterality: N/A;  . Skin cancer removal    .  SPHINCTEROTOMY  08/19/2016   Procedure: SPHINCTEROTOMY;  Surgeon: Zachary Houston, MD;  Location: [facility];  Service: Endoscopy;;    Current Outpatient Prescriptions  Medication Sig Dispense Refill  . B Complex-C (B-COMPLEX WITH VITAMIN C) tablet Take 1 tablet by mouth daily.    . bimatoprost (LUMIGAN) 0.03 % ophthalmic solution Place 1 drop into both eyes daily.    . brimonidine (ALPHAGAN) 0.15 % ophthalmic solution Place 1 drop into the left eye 2 (two) times daily.    . brimonidine-timolol (COMBIGAN) 0.2-0.5 % ophthalmic solution Place 1 drop into both eyes 2 (two) times daily.    . carvedilol (COREG) 25 MG tablet Take 1 tablet (25 mg total) by mouth 2 (two) times daily with a meal.    . diltiazem (CARDIZEM CD) 120 MG 24 hr capsule Take 1 capsule (120 mg total) by mouth daily. 90 capsule 3  . docusate sodium (COLACE) 100 MG capsule Take 1 capsule (100 mg total) by mouth daily. 10 capsule 0  . furosemide (LASIX) 40 MG tablet Take 40 mg by mouth.    . Multiple Vitamin (MULTIVITAMIN WITH MINERALS) TABS tablet Take 1 tablet by mouth daily.    . Multiple Vitamins-Minerals (VISION FORMULA EYE HEALTH) CAPS Take 1 capsule by mouth daily.    . pilocarpine (PILOCAR) 2 % ophthalmic solution     . polyethylene glycol (MIRALAX / GLYCOLAX) packet Take 17 g by mouth daily. 14 each 0  . potassium chloride (K-DUR) 10 MEQ tablet Take 1 tablet (10 mEq total) by mouth daily. 90 tablet 3  .  prednisoLONE acetate (PRED FORTE) 1 % ophthalmic suspension Place 1 drop into both eyes 4 (four) times daily.     . rivaroxaban (XARELTO) 20 MG TABS tablet TAKE ONE (1) TABLET EACH DAY resume on 5/10 30 tablet 9  . traZODone (DESYREL) 100 MG tablet Take 1 tablet (100 mg total) by mouth at bedtime as needed for sleep. 30 tablet 0  . VYZULTA 0.024 % SOLN      No current facility-administered medications for this visit.    Allergies:  Patient has no known allergies.   Social History: The patient  reports that he has  quit smoking. He has quit using smokeless tobacco. He reports that he does not drink alcohol or use drugs.   ROS:  Please see the history of present illness. Otherwise, complete review of systems is positive for generalized weakness.  All other systems are reviewed and negative.   Physical Exam: VS:  BP 122/74   Pulse 80   Ht 5\' 10"  (1.778 m)   Wt 176 lb 9.6 oz (80.1 kg)   SpO2 97%   BMI 25.34 kg/m , BMI Body mass index is 25.34 kg/m.  Wt Readings from Last 3 Encounters:  08/22/16 176 lb 9.6 oz (80.1 kg)  08/17/16 174 lb (78.9 kg)  04/24/16 175 lb (79.4 kg)    General: Elderly male,no specific complaints. HEENT: Conjunctiva and lids normal, oropharynx clear. Neck: Supple, no elevated JVP or carotid bruits, no thyromegaly. Lungs: Clear to auscultation, nonlabored breathing at rest. Cardiac: Irregularly irregular, no S3 or significant systolic murmur, no pericardial rub. Abdomen: Soft, nontender, bowel sounds present, no guarding or rebound. Extremities: No pitting edema of the legs, distal pulses 2+. Skin: Warm and dry. Mildly jaundiced. Musculoskeletal: No kyphosis. Neuropsychiatric: Alert and oriented 3, affect appropriate.  ECG: I personally reviewed the tracing from 01/29/2016 which showed rate-controlled atrial fibrillation with increased voltage and nonspecific T-wave changes.  Recent Labwork: 10/30/2015: Magnesium 2.0 11/02/2015: B Natriuretic Peptide 712.0 11/18/2015: TSH 1.803 08/18/2016: BUN 13; Creatinine, Ser 0.70; Hemoglobin 13.3; Platelets 174; Potassium 3.4; Sodium 136 08/20/2016: ALT 375; AST 120   Other Studies Reviewed Today:  Echocardiogram 11/03/2015: Study Conclusions  - Left ventricle: The cavity size was normal. Wall thickness was   increased in a pattern of mild LVH. Systolic function was normal.   The estimated ejection fraction was 55%. Wall motion was normal;   there were no regional wall motion abnormalities. The study is   not technically  sufficient to allow evaluation of LV diastolic   function. - Aortic valve: Mildly calcified annulus. Trileaflet. There was   mild regurgitation. Mean gradient (S): 3 mm Hg. Valve area (VTI):   2.19 cm^2. Valve area (Vmax): 1.88 cm^2. - Mitral valve: Mildly calcified leaflets . There was mild   regurgitation. - Left atrium: The atrium was severely dilated. - Right atrium: The atrium was severely dilated. Central venous   pressure (est): 15 mm Hg. - Tricuspid valve: There was mild regurgitation. - Pulmonary arteries: PA peak pressure: 47 mm Hg (S). - Pericardium, extracardiac: There was no pericardial effusion.  Impressions:  - Mild LVH with LVEF approximately 55%. Indeterminate diastolic   function in the setting of atrial fibrillation. Mildly calcified   mitral leaflets with mild mitral regurgitation. Severe left   atrial enlargement. Sclerotic aortic valve with mild aortic   regurgitation. Mild tricuspid regurgitation with PASP 47 mmHg.   Severe right atrial enlargement. Elevated estimated CVP.  Abdominal and pelvic CT 08/17/2016: IMPRESSION: 1. Intrahepatic  and extrahepatic biliary dilatation with abrupt transition in duct caliber at the level of the pancreatic head adjacent to a hypodensity. Findings are highly worrisome for pancreatic adenocarcinoma. 2. New low-density hepatic lesions, worrisome for metastatic disease. 3. Previously described left renal lesions are stable. 4. Aortic atherosclerosis. No evidence of tumor vascular encasement. 5. GI consultation for ERCP, stenting and tissue sampling recommended.  Assessment and Plan:  1. Chronic atrial fibrillation, symptomatically stable with good heart rate control on accommodation of Coreg and Cardizem CD. Also continues on Xarelto for stroke prophylaxis. He has had no spontaneous bleeding problems. Recent lab work reviewed above.  2. Pancreatic cancer with recent presentation with painless jaundice status post ERCP and  with pending oncology consultation.  3. Chronic diastolic heart failure, no significant fluid overload at this time. Continue with Lasix and potassium supplement.  4. Essential hypertension, blood pressure is well controlled today.  Current medicines were reviewed with the patient today.  Disposition: Follow-up in 4 months.  Signed, Satira Sark, MD, [facility] 08/22/2016 9:01 AM    [place] [facility] [address] [place], [place] [address] Phone: [phone]; Fax: [phone]

## 2016-08-22 ENCOUNTER — Ambulatory Visit (INDEPENDENT_AMBULATORY_CARE_PROVIDER_SITE_OTHER): Payer: Medicare Other | Admitting: Cardiology

## 2016-08-22 ENCOUNTER — Encounter: Payer: Self-pay | Admitting: Cardiology

## 2016-08-22 VITALS — BP 122/74 | HR 80 | Ht 70.0 in | Wt 176.6 lb

## 2016-08-22 DIAGNOSIS — I482 Chronic atrial fibrillation, unspecified: Secondary | ICD-10-CM

## 2016-08-22 DIAGNOSIS — C250 Malignant neoplasm of head of pancreas: Secondary | ICD-10-CM

## 2016-08-22 DIAGNOSIS — I10 Essential (primary) hypertension: Secondary | ICD-10-CM

## 2016-08-22 DIAGNOSIS — I5032 Chronic diastolic (congestive) heart failure: Secondary | ICD-10-CM

## 2016-08-22 NOTE — Patient Instructions (Signed)
Medication Instructions:  Your physician recommends that you continue on your current medications as directed. Please refer to the Current Medication list given to you today.  Labwork: NONE  Testing/Procedures: NONE  Follow-Up: Your physician recommends that you schedule a follow-up appointment in: 4 MONTHS WITH DR. MCDOWELL  Any Other Special Instructions Will Be Listed Below (If Applicable).  If you need a refill on your cardiac medications before your next appointment, please call your pharmacy. 

## 2016-08-29 ENCOUNTER — Encounter (HOSPITAL_COMMUNITY): Payer: Medicare Other | Attending: Oncology | Admitting: Oncology

## 2016-08-29 ENCOUNTER — Encounter (HOSPITAL_COMMUNITY): Payer: Self-pay

## 2016-08-29 DIAGNOSIS — R17 Unspecified jaundice: Secondary | ICD-10-CM | POA: Diagnosis not present

## 2016-08-29 DIAGNOSIS — C259 Malignant neoplasm of pancreas, unspecified: Secondary | ICD-10-CM | POA: Insufficient documentation

## 2016-08-29 DIAGNOSIS — K219 Gastro-esophageal reflux disease without esophagitis: Secondary | ICD-10-CM

## 2016-08-29 DIAGNOSIS — K831 Obstruction of bile duct: Secondary | ICD-10-CM

## 2016-08-29 MED ORDER — ESOMEPRAZOLE MAGNESIUM 40 MG PO CPDR: 40.0000 mg | DELAYED_RELEASE_CAPSULE | Freq: Every day | ORAL | 4 refills | Status: AC

## 2016-08-29 NOTE — Progress Notes (Signed)
[facility]  CONSULT NOTE  Patient Care Team: Celene Squibb, MD as PCP - General (Internal Medicine)  CHIEF COMPLAINTS/PURPOSE OF CONSULTATION:  Pancreatic cancer HISTORY OF PRESENTING ILLNESS:  Zachary Arellano 83 y.o. male is here because of referral  as inpatient for pancreatic cancer. he was just recently hospitalized on 08/17/16 and discharged from [facility] on May 8. He presented with painless jaundice and was found to have a bilirubin of 13.   CT abd/pelvis with contrast on 08/17/16 demonstrated ill-defined low-density area superiorly in the pancreatic head near the transition in the caliber of the common bile duct, worrisome for a pancreatic malignancy, measures approximately 15 x 17 mm.  Intrahepatic and extrahepatic biliary dilatation with abrupt transition in duct caliber at the level of the pancreatic head adjacent to a hypodensity. New low-density hepatic lesions, worrisome for metastatic disease.   08/18/2016 Baseline CA 19-9 levels at 7,397 U/mL.  08/19/16:ERCP including biliary stent placement and sphincterotomy was performed by Dr. Laural Golden. Findings:  The major papilla appeared normal. A severe localized biliary stricture was found. The stricture was malignant appearing. The upper third of the main bile duct was moderately dilated, with a mass causing an obstruction. A pancreatic obstruction secondary to what appeared to be a mass was found in the head of the pancreas. A sphincterotomy was performed. Brushing cytology taken from bile duct stricture. One covered metal biliary stent was placed into the common bile duct. Bile duct brushing surgical path demonstrated malignant cells consistent with adenocarcinoma.   Patient has a past medical history significant for chronic atrial fibrillation on anticoagulation with xarelto, HTN, glaucoma, and history of stroke of the left cerebellum.    Patient presents to the clinic today for evaluation and treatment of his pancreatic  cancer with his wife and son. I personally reviewed and went over scans with the patient.   He states overall he feels well. Other than the jaundice and dark urine he has not experienced any symptoms related to the pancreatic cancer. He denies any pale stools. He denies any abdominal pain. He does have reflux on occasion for which he takes [place]. He denies any decrease in appetite or fatigue. His son notes that a year ago this month he weighed about 140 pounds. After he spent some time at the hospital, nursing care, and eating properly he gained his weight back. The weight loss was visibly significant. Patient weighed 179 lb today and is his normal weight. He stays active and performs all his own ADLs.  All other symptoms are negative as per ROS.    MEDICAL HISTORY:  Past Medical History:  Diagnosis Date  . Chronic atrial fibrillation ([place])   . Essential hypertension   . Glaucoma   . History of stroke    Left cerebellum.  . Rib fracture     SURGICAL HISTORY: Past Surgical History:  Procedure Laterality Date  . BILIARY STENT PLACEMENT N/A 08/19/2016   Procedure: BILIARY STENT PLACEMENT;  Surgeon: Rogene Houston, MD;  Location[facility];  Service: Endoscopy;  Laterality: N/A;  . CATARACT EXTRACTION    . ERCP N/A 08/19/2016   Procedure: ENDOSCOPIC RETROGRADE CHOLANGIOPANCREATOGRAPHY (ERCP);  Surgeon: Rogene Houston, MD;  Location: [facility];  Service: Endoscopy;  Laterality: N/A;  . Skin cancer removal    . SPHINCTEROTOMY  08/19/2016   Procedure: SPHINCTEROTOMY;  Surgeon: Rogene Houston, MD;  Location: [facility];  Service: Endoscopy;;    SOCIAL HISTORY: Social  History   Social History  . Marital status: Married    Spouse name: N/A  . Number of children: 4  . Years of education: N/A   Occupational History  . Not on file.   Social History Main Topics  . Smoking status: Former Smoker    Types: Cigarettes  . Smokeless tobacco: Former Systems developer     Comment: quit  smoking 50 years ago   . Alcohol use No     Comment: quit drinking beer 1.5 years ago  . Drug use: No  . Sexual activity: No   Other Topics Concern  . Not on file   Social History Narrative  . No narrative on file    FAMILY HISTORY: Family History  Problem Relation Age of Onset  . Cancer Mother   . Heart disease Father   . Emphysema Father   . Heart disease Brother     ALLERGIES:  has No Known Allergies.  MEDICATIONS:  Current Outpatient Prescriptions  Medication Sig Dispense Refill  . B Complex-C (B-COMPLEX WITH VITAMIN C) tablet Take 1 tablet by mouth daily.    . brimonidine-timolol (COMBIGAN) 0.2-0.5 % ophthalmic solution Place 1 drop into both eyes 2 (two) times daily.    . carvedilol (COREG) 25 MG tablet Take 1 tablet (25 mg total) by mouth 2 (two) times daily with a meal.    . diltiazem (CARDIZEM CD) 120 MG 24 hr capsule Take 1 capsule (120 mg total) by mouth daily. 90 capsule 3  . docusate sodium (COLACE) 100 MG capsule Take 1 capsule (100 mg total) by mouth daily. 10 capsule 0  . furosemide (LASIX) 40 MG tablet Take 40 mg by mouth.    . Multiple Vitamin (MULTIVITAMIN WITH MINERALS) TABS tablet Take 1 tablet by mouth daily.    . Multiple Vitamins-Minerals (VISION FORMULA EYE HEALTH) CAPS Take 1 capsule by mouth daily.    . pilocarpine (PILOCAR) 2 % ophthalmic solution     . polyethylene glycol (MIRALAX / GLYCOLAX) packet Take 17 g by mouth daily. 14 each 0  . potassium chloride (K-DUR) 10 MEQ tablet Take 1 tablet (10 mEq total) by mouth daily. 90 tablet 3  . Propylene Glycol (SYSTANE BALANCE OP) Apply 1 drop to eye 3 (three) times daily.    . rivaroxaban (XARELTO) 20 MG TABS tablet TAKE ONE (1) TABLET EACH DAY resume on 5/10 30 tablet 9  . traZODone (DESYREL) 100 MG tablet Take 1 tablet (100 mg total) by mouth at bedtime as needed for sleep. 30 tablet 0  . VYZULTA 0.024 % SOLN      No current facility-administered medications for this visit.     Review of Systems    Constitutional: Negative.        Feeling good.  HENT: Negative.   Eyes: Negative.   Respiratory: Negative.   Cardiovascular: Negative.   Gastrointestinal: Negative.  Negative for abdominal pain.       Denies stool discoloration.  Genitourinary: Negative.   Musculoskeletal: Negative.   Skin: Negative.   Neurological: Negative.   Endo/Heme/Allergies: Negative.   Psychiatric/Behavioral: Negative.   All other systems reviewed and are negative.  14 point ROS was done and is otherwise as detailed above or in HPI   PHYSICAL EXAMINATION: ECOG PERFORMANCE STATUS: 1 - Symptomatic but completely ambulatory  Vitals:   08/29/16 0835  BP: (!) 143/84  Pulse: 94  Resp: 18  Temp: 97.9 F (36.6 C)   Filed Weights   08/29/16 0835  Weight: 179  lb 6.4 oz (81.4 kg)     Physical Exam  Constitutional: He is oriented to person, place, and time and well-developed, well-nourished, and in no distress.  HENT:  Head: Normocephalic and atraumatic.  Eyes: Conjunctivae and EOM are normal. Pupils are equal, round, and reactive to light. No scleral icterus.  Neck: Normal range of motion. Neck supple.  Cardiovascular: Normal rate and normal heart sounds.   Irregularly irregular  Pulmonary/Chest: Effort normal and breath sounds normal.  Abdominal: Soft. Bowel sounds are normal.  Musculoskeletal: Normal range of motion.  Neurological: He is alert and oriented to person, place, and time. Gait normal.  Skin: Skin is warm and dry.  No jaundice noted  Nursing note and vitals reviewed.    LABORATORY DATA:  I have reviewed the data as listed Lab Results  Component Value Date   WBC 6.7 08/18/2016   HGB 13.3 08/18/2016   HCT 37.6 (L) 08/18/2016   MCV 92.4 08/18/2016   PLT 174 08/18/2016   CMP     Component Value Date/Time   NA 136 08/18/2016 0610   K 3.4 (L) 08/18/2016 0610   CL 97 (L) 08/18/2016 0610   CO2 30 08/18/2016 0610   GLUCOSE 102 (H) 08/18/2016 0610   BUN 13 08/18/2016 0610    CREATININE 0.70 08/18/2016 0610   CREATININE 1.08 03/25/2016 1401   CALCIUM 9.3 08/18/2016 0610   PROT 5.8 (L) 08/20/2016 0414   ALBUMIN 3.2 (L) 08/20/2016 0414   AST 120 (H) 08/20/2016 0414   ALT 375 (H) 08/20/2016 0414   ALKPHOS 177 (H) 08/20/2016 0414   BILITOT 5.5 (H) 08/20/2016 0414   GFRNONAA >60 08/18/2016 0610   GFRAA >60 08/18/2016 2595     RADIOGRAPHIC STUDIES: I have personally reviewed the radiological images as listed and agreed with the findings in the report. No results found.  CT ABDOMEN AND PELVIS WITH CONTRAST 08/17/2016  IMPRESSION: 1. Intrahepatic and extrahepatic biliary dilatation with abrupt transition in duct caliber at the level of the pancreatic head adjacent to a hypodensity. Findings are highly worrisome for pancreatic adenocarcinoma. 2. New low-density hepatic lesions, worrisome for metastatic disease. 3. Previously described left renal lesions are stable. 4. Aortic atherosclerosis. No evidence of tumor vascular encasement. 5. GI consultation for ERCP, stenting and tissue sampling recommended.  ERCP with CBD metallic stent insertion 6/38/7564  IMPRESSION: Distal CBD obstruction with insertion of a common bile duct stent successfully.   PATHOLOGY:    ASSESSMENT & PLAN:  1. Pancreatic adenocarcinoma 2. Painless jaundice due to biliary obstruction s/p ERCP including biliary stent placement and sphincterotomy on 08/19/16.  PLAN: Scans reviewed. Results noted above.   I have ordered a stat PET scan for staging. I am concerned that he may have metastatic disease with liver mets.   I discussed with the patient regarding chemotherapy and have discussed that if he does in fact have metastatic disease it would be palliative in nature. I have discussed chemotherapy with possibly gemzar + abraxane vs. Gemzar alone. I have discussed side effects and provided them reading material on the chemotherapy. Patient is concerned about decrease in his quality of  life with chemotherapy and wanted to await the results of the PET scan before making any further decisions.   I prescribed Nexium for GERD.   RTC in 1-2 days after PET scan is performed to review the scan and to discuss further treatment options.    ORDERS PLACED FOR THIS ENCOUNTER: Orders Placed This Encounter  Procedures  .  NM PET Image Initial (PI) Skull Base To Thigh     All questions were answered. The patient knows to call the clinic with any problems, questions or concerns.  This document serves as a record of services personally performed by Twana First, MD. It was created on her behalf by Shirlean Mylar, a trained medical scribe. The creation of this record is based on the scribe's personal observations and the provider's statements to them. This document has been checked and approved by the attending provider.  I have reviewed the above documentation for accuracy and completeness and I agree with the above.  This note was electronically signed.    [employer]  08/29/2016 9:12 AM

## 2016-08-29 NOTE — Patient Instructions (Addendum)
[place] at [facility] Discharge Instructions  RECOMMENDATIONS MADE BY THE CONSULTANT AND ANY TEST RESULTS WILL BE SENT TO YOUR REFERRING PHYSICIAN.  You were seen today by Dr. Twana First We will get you scheduled for PET scan as soon as possible. Follow up a few days after PET to review results.   Thank you for choosing [place] at [facility] to provide your oncology and hematology care.  To afford each patient quality time with our provider, please arrive at least 15 minutes before your scheduled appointment time.    If you have a lab appointment with the [facility] please come in thru the  Main Entrance and check in at the main information desk  You need to re-schedule your appointment should you arrive 10 or more minutes late.  We strive to give you quality time with our providers, and arriving late affects you and other patients whose appointments are after yours.  Also, if you no show three or more times for appointments you may be dismissed from the clinic at the providers discretion.     Again, thank you for choosing [facility].  Our hope is that these requests will decrease the amount of time that you wait before being seen by our physicians.       _____________________________________________________________  Should you have questions after your visit to [facility], please contact our office at [phone] between the hours of 8:30 a.m. and 4:30 p.m.  Voicemails left after 4:30 p.m. will not be returned until the following business day.  For prescription refill requests, have your pharmacy contact our office.       Resources For Cancer Patients and their Caregivers ? American Cancer Society: Can assist with transportation, wigs, general needs, runs Look Good Feel Better.        267-575-2528 ? Cancer Care: Provides financial assistance, online support groups, medication/co-pay assistance.   1-800-813-HOPE (646) 259-6552) ? [place] Assists [place] cancer patients and their families through emotional , educational and financial support.  936-558-8989 ? [place] DSS Where to apply for food stamps, Medicaid and utility assistance. 937-047-0776 ? [facility]: Transportation to medical appointments. 9292809552 ? Social Security Administration: May apply for disability if have a Stage IV cancer. 267-214-1633 330-157-5180 ? [employer], Disability and Transit Services: Assists with nutrition, care and transit needs. [place] Support Programs: @10RELATIVEDAYS @ > Cancer Support Group  2nd Tuesday of the month 1pm-2pm, Journey Room  > Creative Journey  3rd Tuesday of the month 1130am-1pm, Journey Room  > Look Good Feel Better  1st Wednesday of the month 10am-12 noon, Journey Room (Call [place] to register [phone])

## 2016-09-04 ENCOUNTER — Ambulatory Visit (HOSPITAL_COMMUNITY): Payer: Medicare Other

## 2016-09-04 ENCOUNTER — Telehealth: Payer: Self-pay | Admitting: Cardiology

## 2016-09-04 NOTE — Telephone Encounter (Signed)
I will forward to Dr.McDowell °

## 2016-09-04 NOTE — Telephone Encounter (Signed)
Patient is having port a cath placed Wednesday 5/30. Needs to know how long to be off Xarelto prior to. / tg

## 2016-09-05 NOTE — Telephone Encounter (Signed)
Generally 24-48 hours.

## 2016-09-05 NOTE — Telephone Encounter (Signed)
Pt informed

## 2016-09-10 ENCOUNTER — Encounter: Payer: Self-pay | Admitting: *Deleted

## 2016-09-10 NOTE — Progress Notes (Signed)
[facility] Psychosocial Distress Screening Clinical Social Work  Clinical Social Work was referred by distress screening protocol.  The patient scored a 10 on the Psychosocial Distress Thermometer which indicates severe distress. Clinical Social Worker reviewed chart to assess for distress and other psychosocial needs. Pt refused CSW contact at this time, no upcoming [facility] appointments noted as of today. CSW will be available for support/needs if/when pt returns if he consents to [place] contact.   ONCBCN DISTRESS SCREENING 08/29/2016  Screening Type Initial Screening  Distress experienced in past week (1-10) 10  Emotional problem type Nervousness/Anxiety  Information Concerns Type Lack of info about diagnosis;Lack of info about treatment;Lack of info about complementary therapy choices;Lack of info about maintaining fitness  Physician notified of physical symptoms Yes  Referral to clinical psychology No  Referral to clinical social work No Pt refused.  Referral to dietition No  Referral to financial advocate No  Referral to support programs No  Referral to palliative care No    Clinical Social Worker follow up needed: Yes.    If yes, follow up plan:  SEE Amaya, LCSW, OSW-C [place] Tuesdays   [phone]

## 2016-10-07 ENCOUNTER — Other Ambulatory Visit (HOSPITAL_COMMUNITY): Payer: Self-pay | Admitting: Oncology

## 2016-10-07 DIAGNOSIS — M7989 Other specified soft tissue disorders: Secondary | ICD-10-CM

## 2016-10-08 ENCOUNTER — Ambulatory Visit (HOSPITAL_COMMUNITY)
Admission: RE | Admit: 2016-10-08 | Discharge: 2016-10-08 | Disposition: A | Discharge status: Home / Self Care | Source: Ambulatory Visit | Attending: Oncology | Admitting: Oncology

## 2016-10-08 DIAGNOSIS — M7989 Other specified soft tissue disorders: Secondary | ICD-10-CM | POA: Diagnosis present

## 2016-10-08 DIAGNOSIS — C250 Malignant neoplasm of head of pancreas: Secondary | ICD-10-CM | POA: Diagnosis present

## 2016-11-13 DEATH — deceased

## 2016-12-24 ENCOUNTER — Ambulatory Visit: Payer: Medicare Other | Admitting: Cardiology

## 2017-03-14 IMAGING — CR DG ABD PORTABLE 2V
2 series · 2 of 2 positions shown · non-contrast
Comparison: None.

CLINICAL DATA: Constipation.  Abdominal pain.

EXAM:
PORTABLE ABDOMEN - 2 VIEW

[supine ap (1 of 2)]
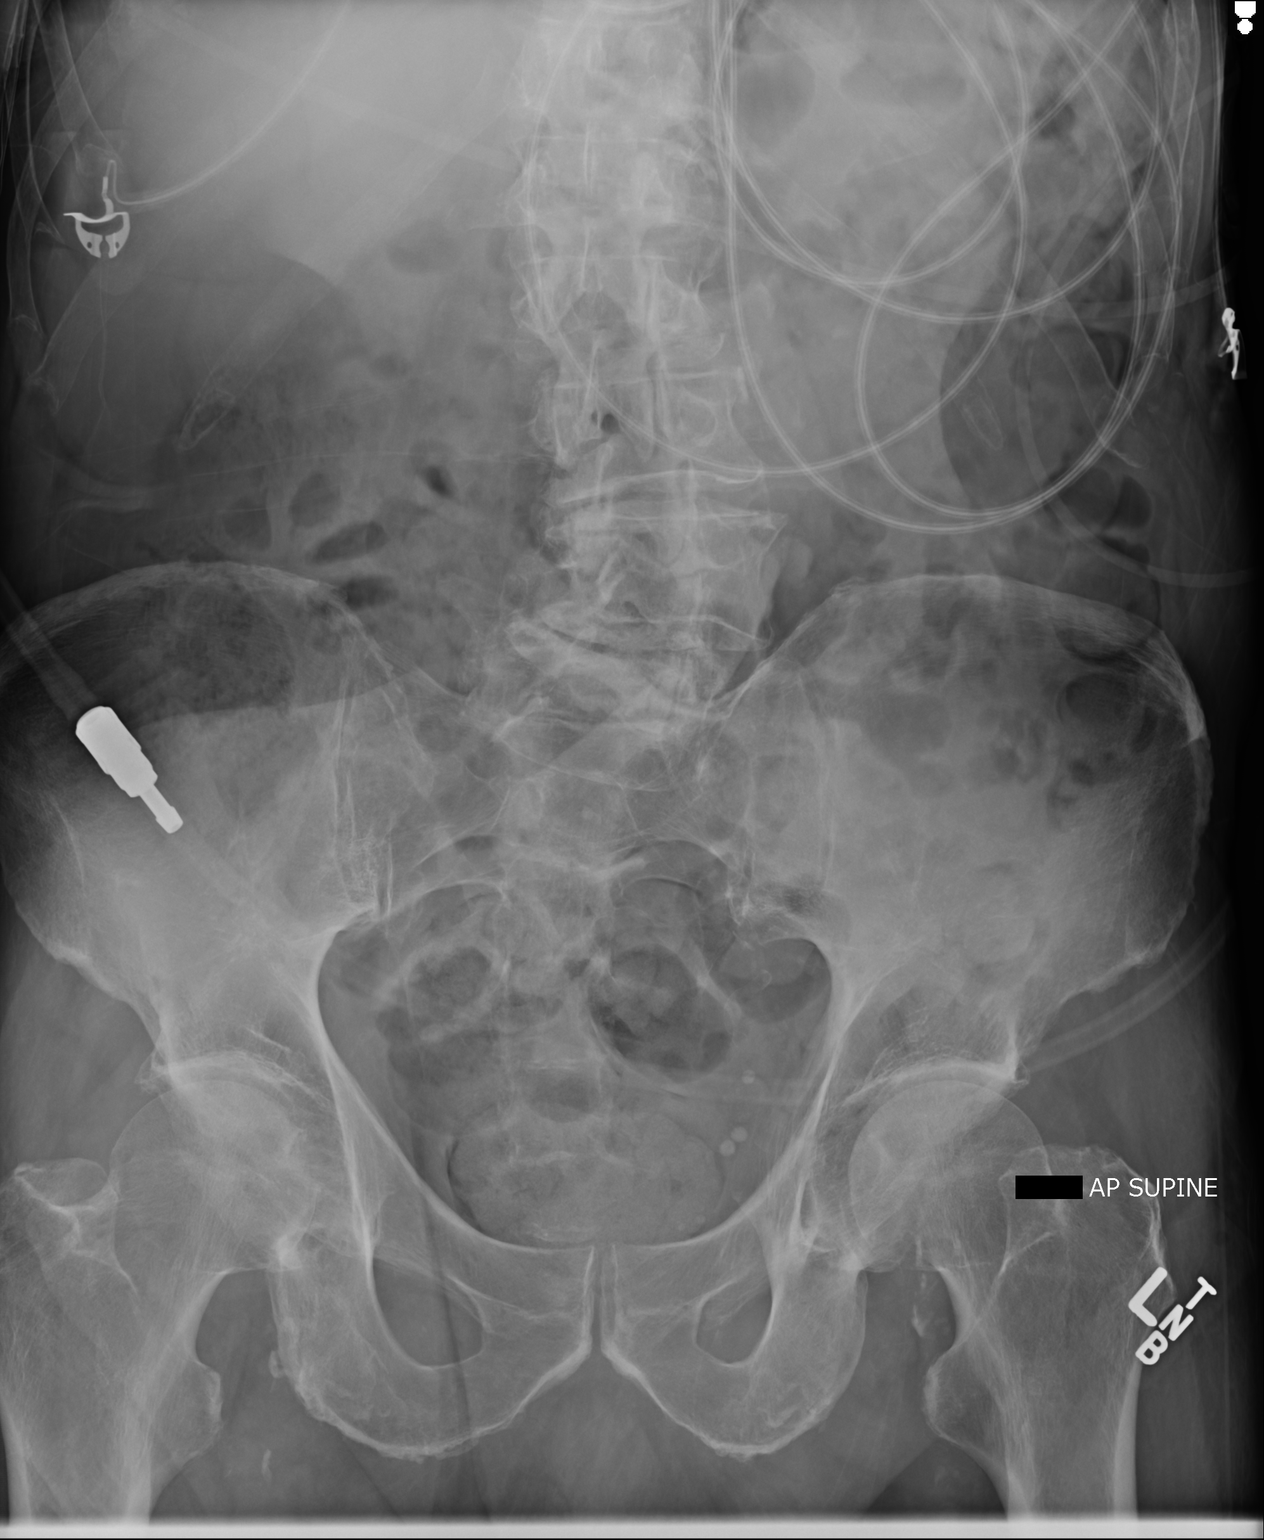

[supine ap (2 of 2)]
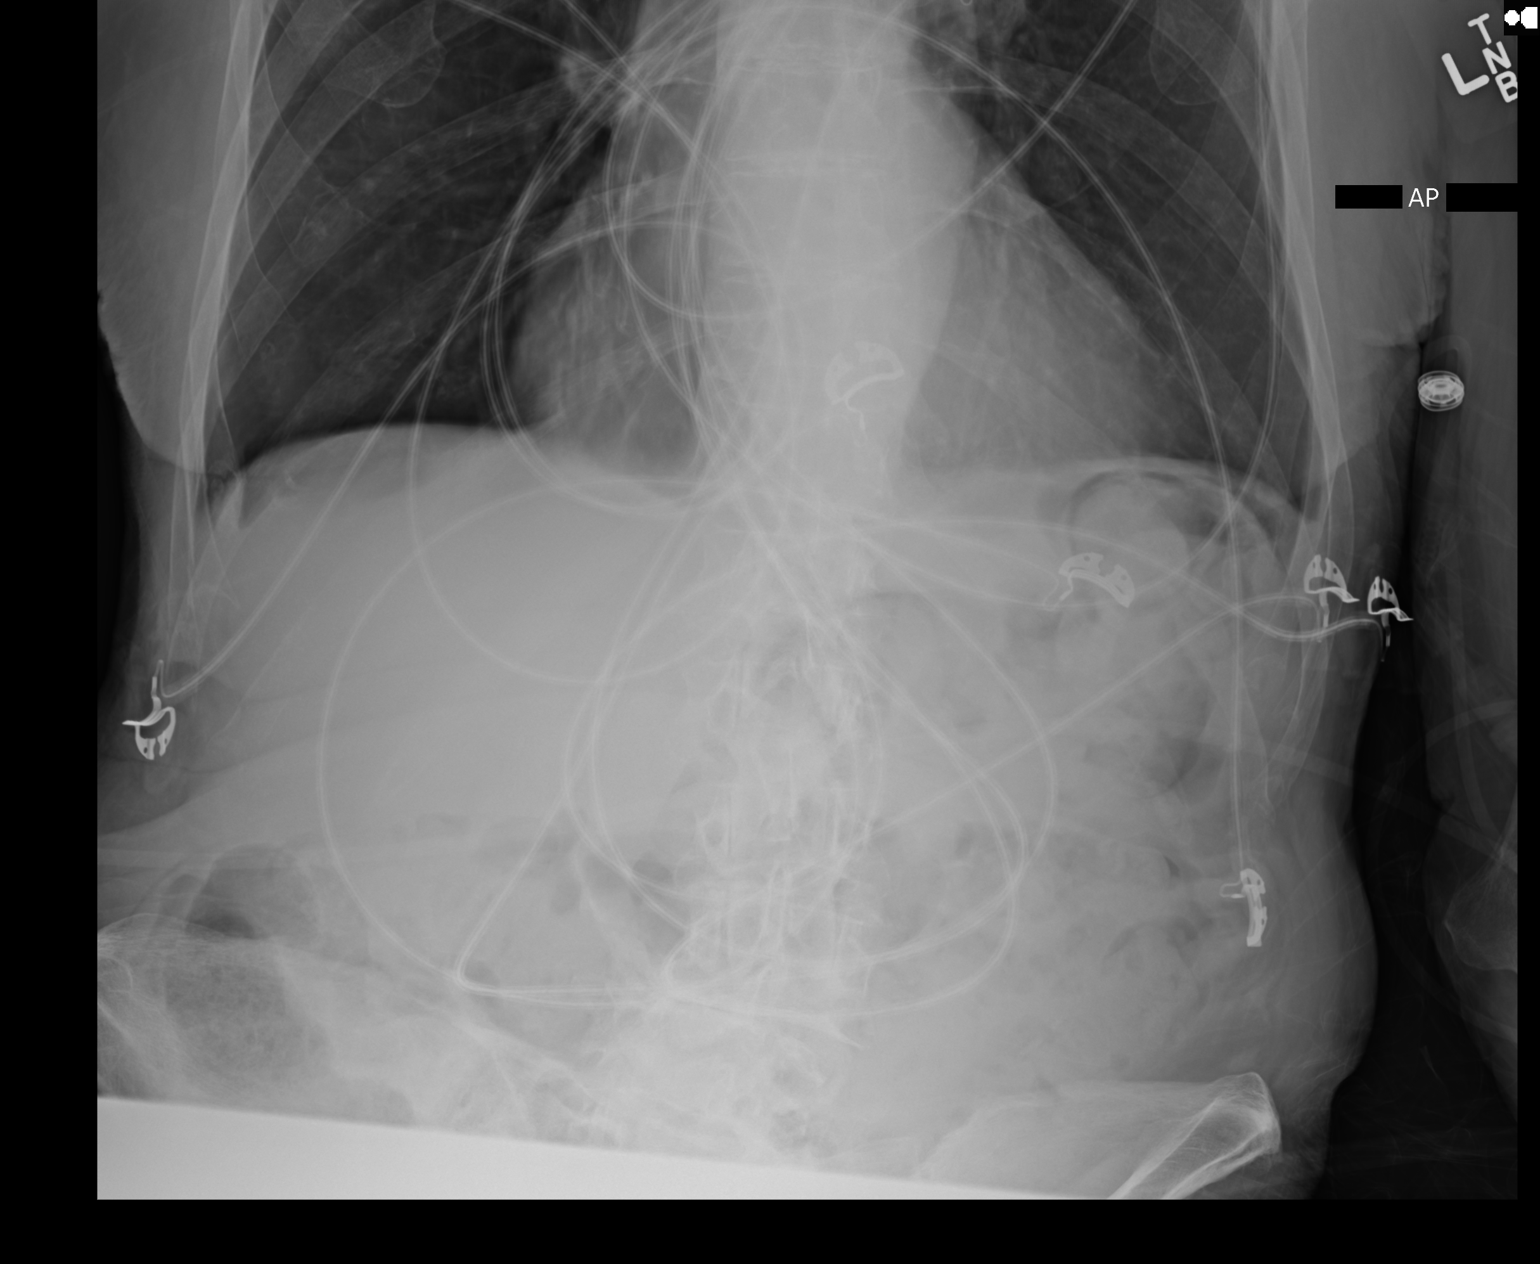

[2 of 2 positions shown; findings below may reference images not displayed]

FINDINGS: Lung bases are within normal limits. No free air, portal venous gas,
or pneumatosis. Mild fecal loading in the colon. No bowel
obstruction. Scoliotic and degenerative changes seen in the spine.
No other acute abnormalities.
IMPRESSION: Mild fecal loading in the colon.  No other acute abnormalities.

## 2017-03-14 IMAGING — CR DG CHEST 1V PORT
1 series · 1 of 1 positions shown · non-contrast
Comparison: Most recent chest radiographs 11/02/2015, additional
priors.

CLINICAL DATA: Hypotension.  Sudden onset dizziness today.

EXAM:
PORTABLE CHEST 1 VIEW

[ap portable]
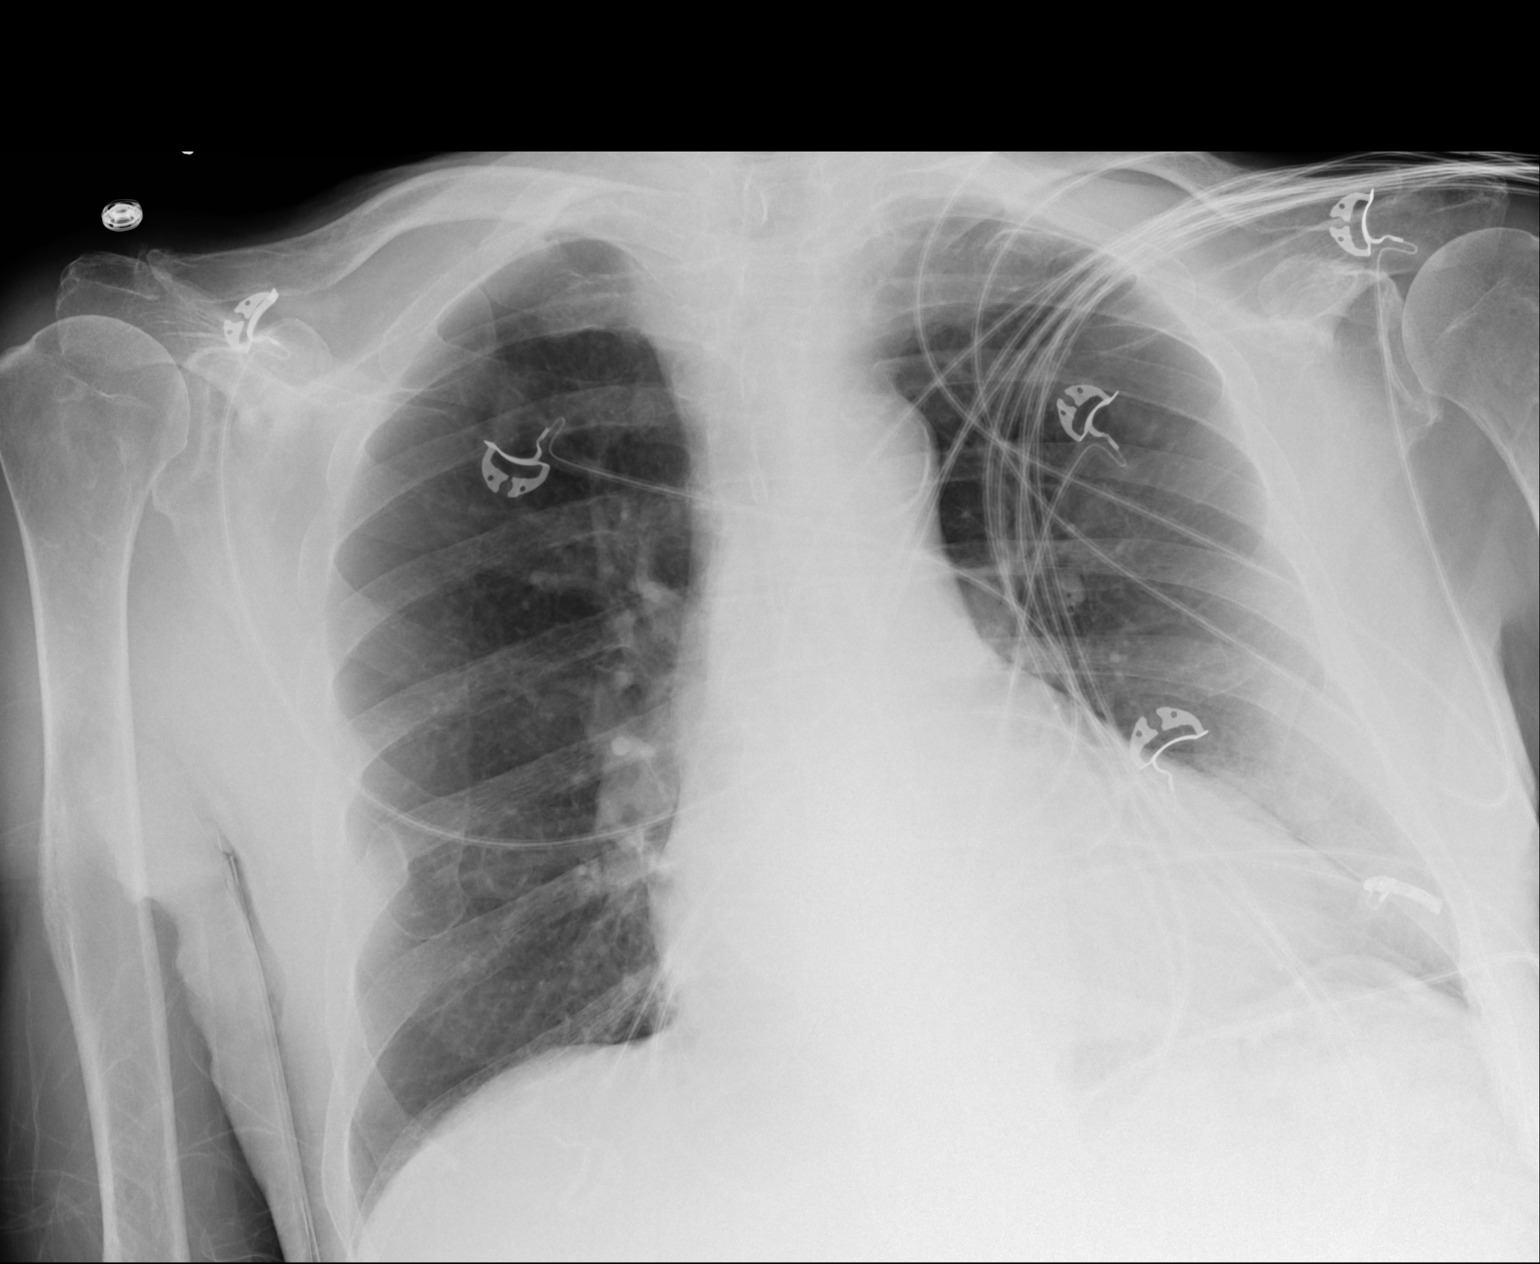

[1 of 1 positions shown; findings below may reference images not displayed]

FINDINGS: Improved bibasilar and perihilar aeration from prior exam. Question
of residual retrocardiac opacity is suboptimally assessed by
portable technique. There stable cardiomegaly and atherosclerotic
calcification of the aortic arch. No large pleural effusion or
pneumothorax. Remote right rib fractures.
IMPRESSION: Improved perihilar and bibasilar aeration from prior exam, improved
pneumonia or pulmonary edema. Question residual opacity at the left
lung base suboptimally assessed by portable technique.

Stable cardiomegaly and aortic atherosclerosis.

## 2017-03-17 IMAGING — DX DG CHEST 2V
2 series · 2 of 2 positions shown · non-contrast
Comparison: 11/17/2015.

CLINICAL DATA: Pneumonia followup

EXAM:
CHEST  2 VIEW

[chest pa]
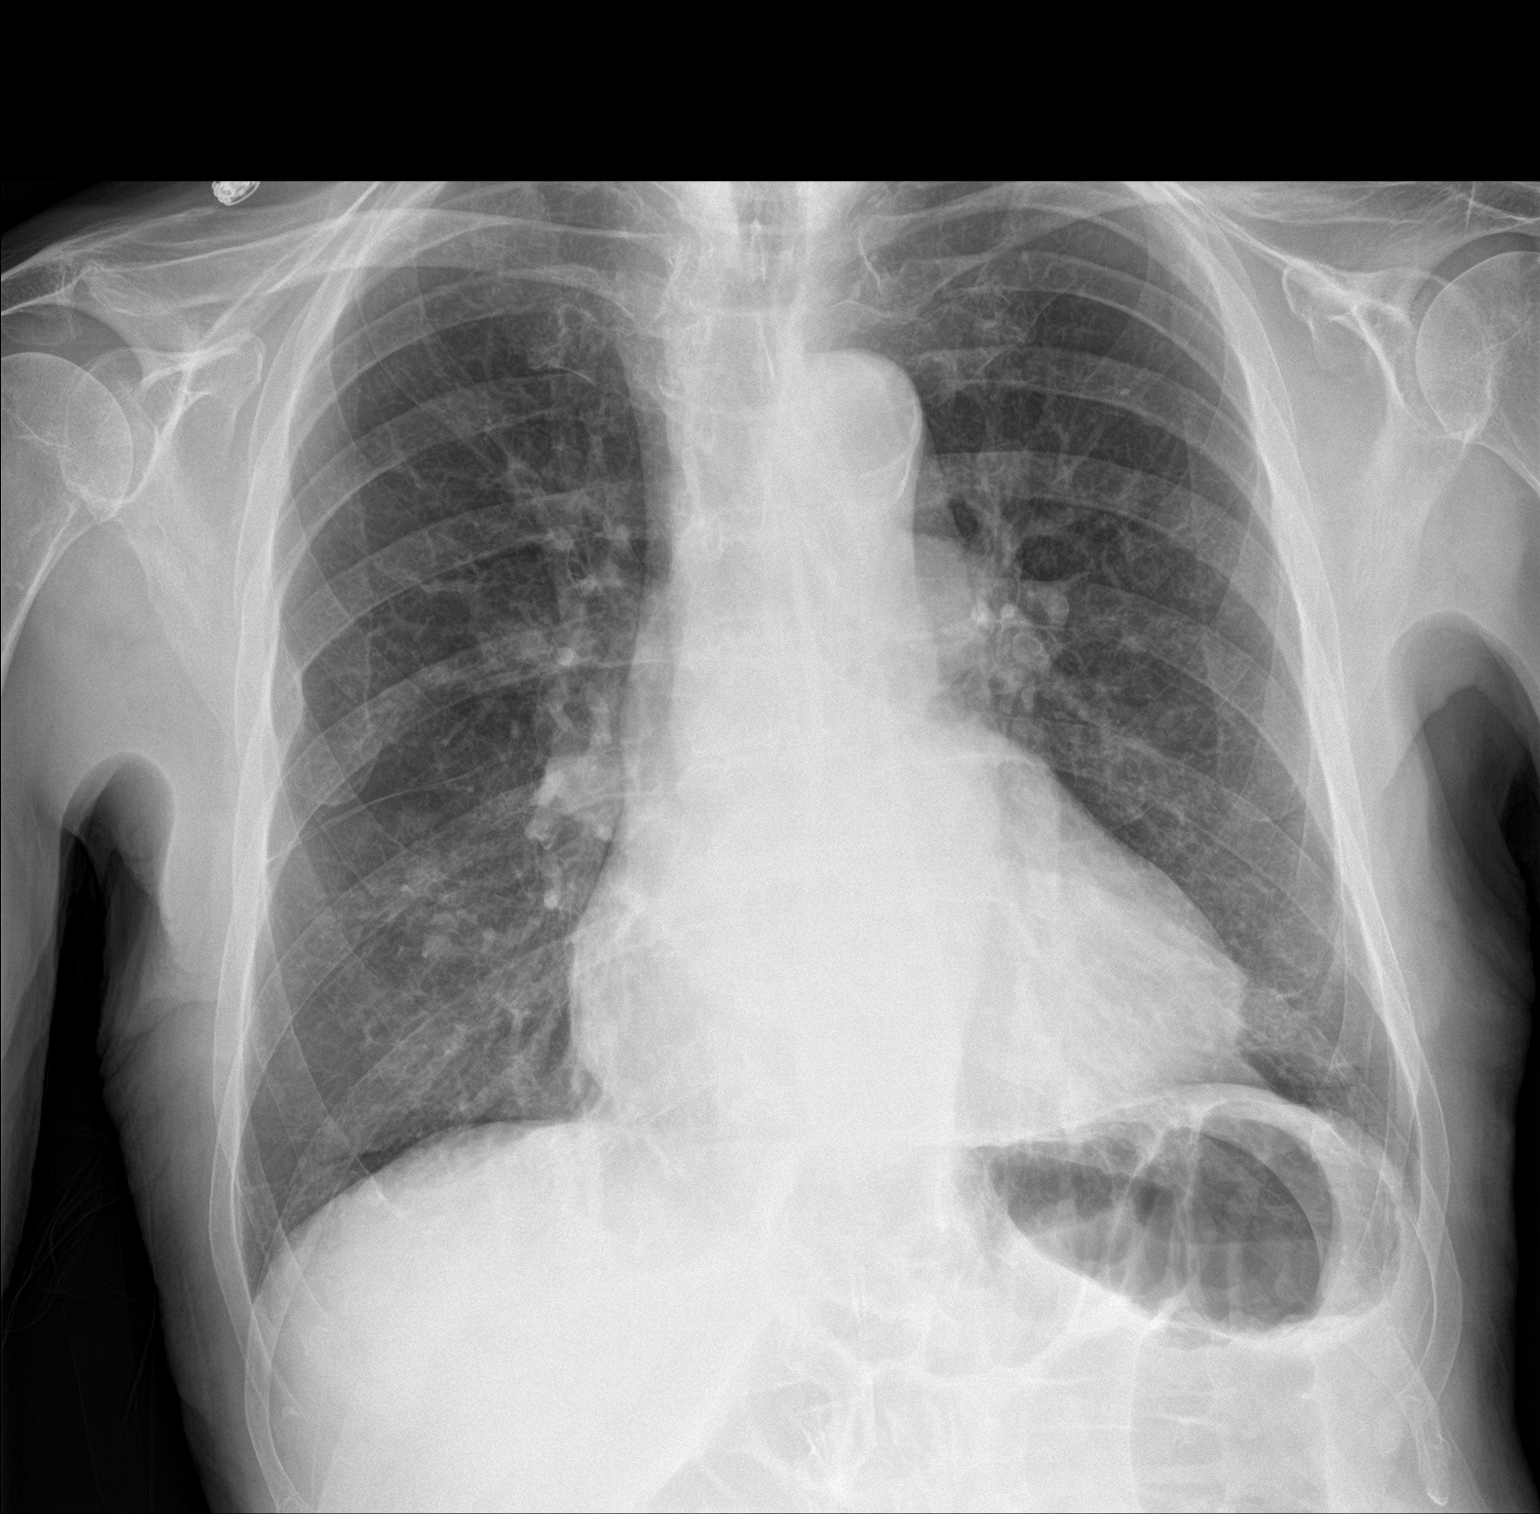

[chest lat]
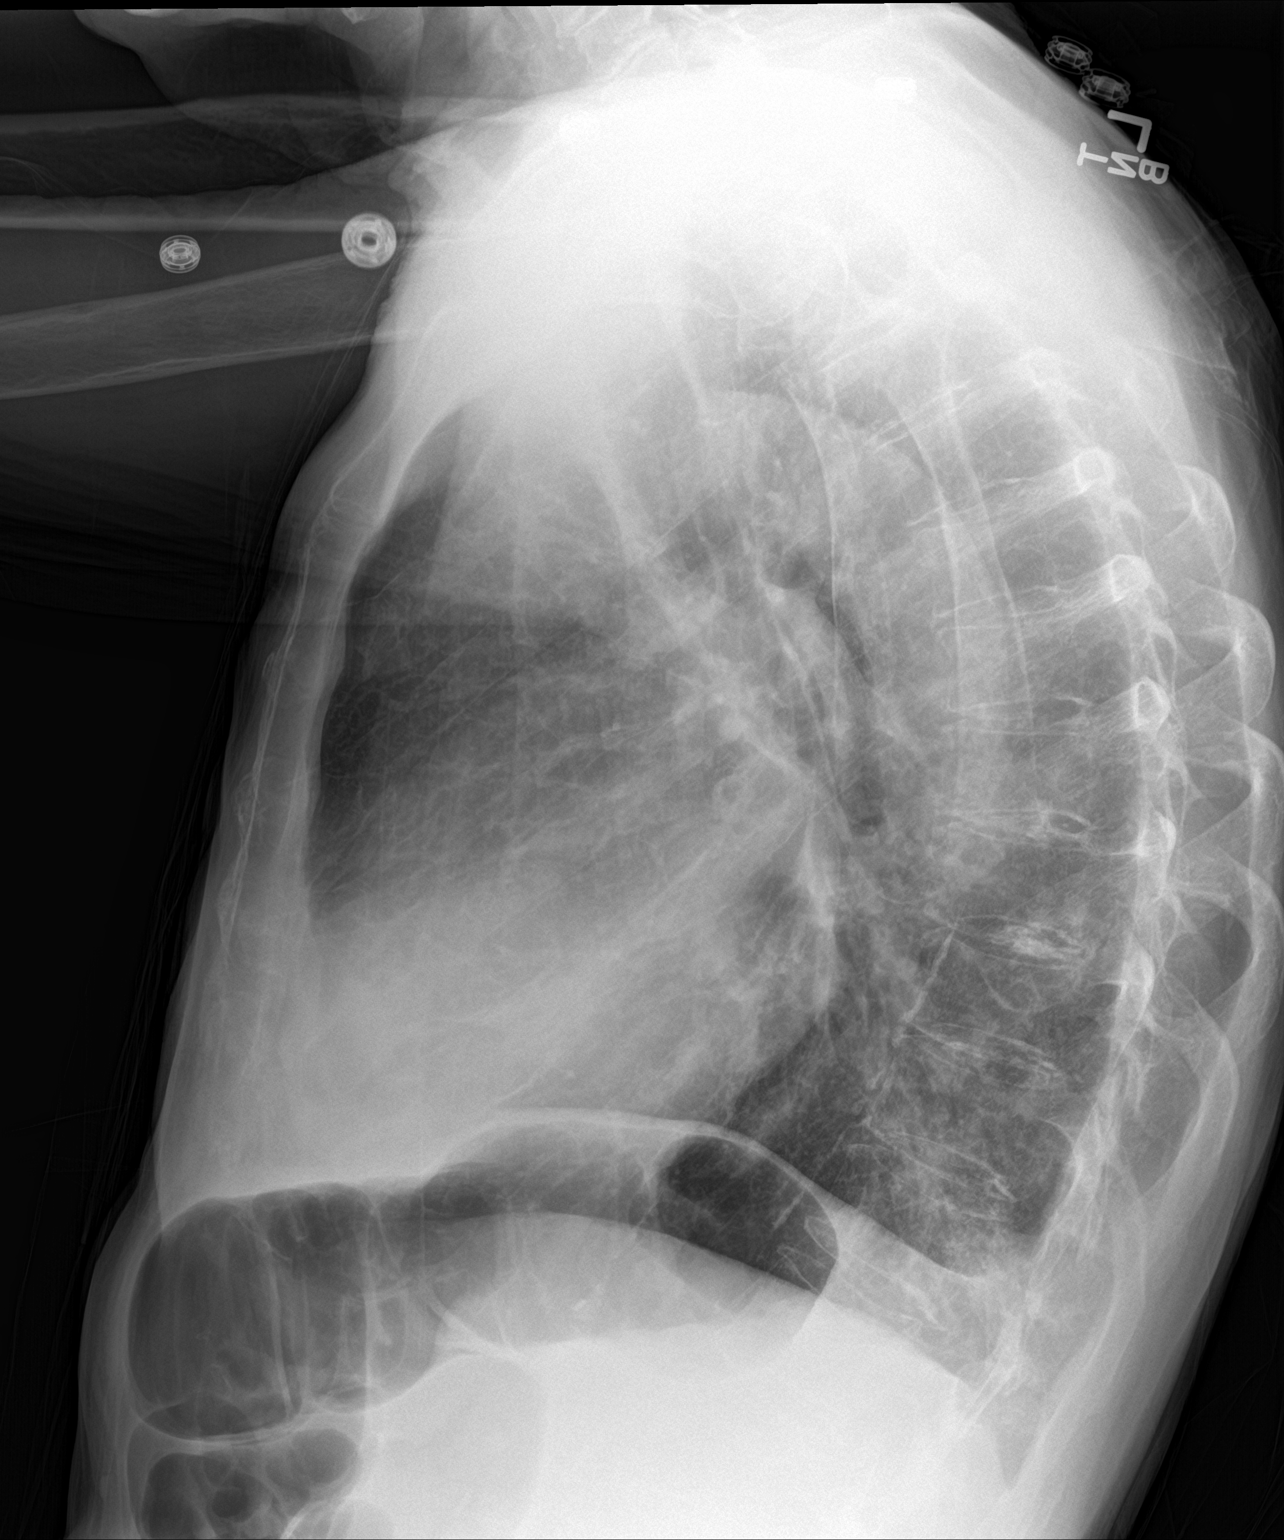

[2 of 2 positions shown; findings below may reference images not displayed]

FINDINGS: Mild cardiac enlargement. Aortic atherosclerosis is noted. There is
a a opacity in the posterior left base compatible with pneumonia.
IMPRESSION: Left base pneumonia.

Aortic atherosclerosis.
